# Patient Record
Sex: Female | Born: 1935 | Race: White | Hispanic: No | Smoking: Never smoker
Health system: Southern US, Community
[De-identification: ages and names within clinical notes are randomized; demographics above are authoritative.]

## PROBLEM LIST (undated history)

## (undated) DIAGNOSIS — I10 Essential (primary) hypertension: Secondary | ICD-10-CM

## (undated) DIAGNOSIS — M545 Low back pain, unspecified: Secondary | ICD-10-CM

## (undated) DIAGNOSIS — K219 Gastro-esophageal reflux disease without esophagitis: Secondary | ICD-10-CM

## (undated) DIAGNOSIS — N289 Disorder of kidney and ureter, unspecified: Secondary | ICD-10-CM

## (undated) DIAGNOSIS — R7309 Other abnormal glucose: Secondary | ICD-10-CM

## (undated) DIAGNOSIS — D649 Anemia, unspecified: Secondary | ICD-10-CM

## (undated) DIAGNOSIS — E785 Hyperlipidemia, unspecified: Secondary | ICD-10-CM

## (undated) DIAGNOSIS — M8580 Other specified disorders of bone density and structure, unspecified site: Secondary | ICD-10-CM

## (undated) DIAGNOSIS — F32A Depression, unspecified: Secondary | ICD-10-CM

## (undated) DIAGNOSIS — K449 Diaphragmatic hernia without obstruction or gangrene: Secondary | ICD-10-CM

## (undated) DIAGNOSIS — F329 Major depressive disorder, single episode, unspecified: Secondary | ICD-10-CM

## (undated) DIAGNOSIS — I739 Peripheral vascular disease, unspecified: Secondary | ICD-10-CM

## (undated) HISTORY — DX: Low back pain: M54.5

## (undated) HISTORY — PX: PARATHYROIDECTOMY: SHX19

## (undated) HISTORY — DX: Peripheral vascular disease, unspecified: I73.9

## (undated) HISTORY — PX: OTHER SURGICAL HISTORY: SHX169

## (undated) HISTORY — PX: CHOLECYSTECTOMY: SHX55

## (undated) HISTORY — DX: Disorder of kidney and ureter, unspecified: N28.9

## (undated) HISTORY — DX: Major depressive disorder, single episode, unspecified: F32.9

## (undated) HISTORY — DX: Other abnormal glucose: R73.09

## (undated) HISTORY — DX: Other specified disorders of bone density and structure, unspecified site: M85.80

## (undated) HISTORY — DX: Depression, unspecified: F32.A

## (undated) HISTORY — PX: TONSILLECTOMY AND ADENOIDECTOMY: SUR1326

## (undated) HISTORY — DX: Hyperlipidemia, unspecified: E78.5

## (undated) HISTORY — DX: Anemia, unspecified: D64.9

## (undated) HISTORY — DX: Gastro-esophageal reflux disease without esophagitis: K21.9

## (undated) HISTORY — PX: ESOPHAGOGASTRODUODENOSCOPY: SHX1529

## (undated) HISTORY — DX: Low back pain, unspecified: M54.50

## (undated) HISTORY — DX: Essential (primary) hypertension: I10

## (undated) HISTORY — DX: Diaphragmatic hernia without obstruction or gangrene: K44.9

---

## 1960-08-05 HISTORY — PX: APPENDECTOMY: SHX54

## 1960-08-05 HISTORY — PX: OOPHORECTOMY: SHX86

## 1960-08-05 HISTORY — PX: DILATION AND CURETTAGE OF UTERUS: SHX78

## 1997-11-01 ENCOUNTER — Other Ambulatory Visit: Admission: RE | Admit: 1997-11-01 | Discharge: 1997-11-01 | Payer: Self-pay | Admitting: Family Medicine

## 1998-09-21 ENCOUNTER — Other Ambulatory Visit: Admission: RE | Admit: 1998-09-21 | Discharge: 1998-09-21 | Payer: Self-pay | Admitting: Obstetrics and Gynecology

## 1998-10-13 ENCOUNTER — Ambulatory Visit (HOSPITAL_COMMUNITY): Admission: RE | Admit: 1998-10-13 | Discharge: 1998-10-13 | Payer: Self-pay | Admitting: Obstetrics and Gynecology

## 1998-10-13 ENCOUNTER — Encounter: Payer: Self-pay | Admitting: Obstetrics and Gynecology

## 1998-11-14 ENCOUNTER — Encounter: Payer: Self-pay | Admitting: Obstetrics and Gynecology

## 1998-11-17 ENCOUNTER — Ambulatory Visit (HOSPITAL_COMMUNITY): Admission: RE | Admit: 1998-11-17 | Discharge: 1998-11-17 | Payer: Self-pay | Admitting: Obstetrics and Gynecology

## 1999-04-12 ENCOUNTER — Encounter: Payer: Self-pay | Admitting: Emergency Medicine

## 1999-04-12 ENCOUNTER — Inpatient Hospital Stay (HOSPITAL_COMMUNITY): Admission: EM | Admit: 1999-04-12 | Discharge: 1999-04-13 | Payer: Self-pay | Admitting: Emergency Medicine

## 1999-08-09 ENCOUNTER — Encounter: Payer: Self-pay | Admitting: Family Medicine

## 1999-08-09 ENCOUNTER — Encounter: Admission: RE | Admit: 1999-08-09 | Discharge: 1999-08-09 | Payer: Self-pay | Admitting: Family Medicine

## 1999-09-26 ENCOUNTER — Other Ambulatory Visit: Admission: RE | Admit: 1999-09-26 | Discharge: 1999-09-26 | Payer: Self-pay | Admitting: *Deleted

## 2000-01-13 ENCOUNTER — Emergency Department (HOSPITAL_COMMUNITY): Admission: EM | Admit: 2000-01-13 | Discharge: 2000-01-13 | Payer: Self-pay | Admitting: *Deleted

## 2000-01-13 ENCOUNTER — Encounter: Payer: Self-pay | Admitting: Orthopedic Surgery

## 2000-01-13 ENCOUNTER — Encounter: Payer: Self-pay | Admitting: Emergency Medicine

## 2000-05-05 HISTORY — PX: WRIST FRACTURE SURGERY: SHX121

## 2000-05-14 ENCOUNTER — Encounter: Payer: Self-pay | Admitting: Gastroenterology

## 2000-08-12 ENCOUNTER — Encounter: Payer: Self-pay | Admitting: Internal Medicine

## 2000-08-12 ENCOUNTER — Encounter: Admission: RE | Admit: 2000-08-12 | Discharge: 2000-08-12 | Payer: Self-pay | Admitting: Internal Medicine

## 2000-09-30 ENCOUNTER — Other Ambulatory Visit: Admission: RE | Admit: 2000-09-30 | Discharge: 2000-09-30 | Payer: Self-pay | Admitting: Obstetrics and Gynecology

## 2000-12-10 ENCOUNTER — Encounter: Payer: Self-pay | Admitting: Gastroenterology

## 2000-12-25 ENCOUNTER — Ambulatory Visit (HOSPITAL_COMMUNITY): Admission: RE | Admit: 2000-12-25 | Discharge: 2000-12-25 | Payer: Self-pay | Admitting: *Deleted

## 2000-12-25 ENCOUNTER — Encounter: Payer: Self-pay | Admitting: *Deleted

## 2001-05-19 ENCOUNTER — Encounter: Admission: RE | Admit: 2001-05-19 | Discharge: 2001-06-18 | Payer: Self-pay | Admitting: Orthopaedic Surgery

## 2001-08-14 ENCOUNTER — Encounter: Admission: RE | Admit: 2001-08-14 | Discharge: 2001-08-14 | Payer: Self-pay | Admitting: Internal Medicine

## 2001-08-14 ENCOUNTER — Encounter: Payer: Self-pay | Admitting: Internal Medicine

## 2001-10-12 ENCOUNTER — Other Ambulatory Visit: Admission: RE | Admit: 2001-10-12 | Discharge: 2001-10-12 | Payer: Self-pay | Admitting: Obstetrics and Gynecology

## 2002-04-28 ENCOUNTER — Encounter: Admission: RE | Admit: 2002-04-28 | Discharge: 2002-05-21 | Payer: Self-pay | Admitting: Internal Medicine

## 2002-08-24 ENCOUNTER — Encounter: Payer: Self-pay | Admitting: Internal Medicine

## 2002-08-24 ENCOUNTER — Encounter: Admission: RE | Admit: 2002-08-24 | Discharge: 2002-08-24 | Payer: Self-pay | Admitting: Internal Medicine

## 2003-08-18 ENCOUNTER — Encounter: Admission: RE | Admit: 2003-08-18 | Discharge: 2003-08-18 | Payer: Self-pay | Admitting: Internal Medicine

## 2003-09-06 ENCOUNTER — Encounter: Admission: RE | Admit: 2003-09-06 | Discharge: 2003-09-06 | Payer: Self-pay | Admitting: Internal Medicine

## 2003-11-28 ENCOUNTER — Encounter: Admission: RE | Admit: 2003-11-28 | Discharge: 2003-11-28 | Payer: Self-pay | Admitting: Internal Medicine

## 2003-11-29 ENCOUNTER — Other Ambulatory Visit: Admission: RE | Admit: 2003-11-29 | Discharge: 2003-11-29 | Payer: Self-pay | Admitting: Obstetrics and Gynecology

## 2003-12-14 ENCOUNTER — Encounter: Admission: RE | Admit: 2003-12-14 | Discharge: 2003-12-28 | Payer: Self-pay | Admitting: Internal Medicine

## 2004-06-27 ENCOUNTER — Ambulatory Visit: Payer: Self-pay | Admitting: Internal Medicine

## 2004-07-03 ENCOUNTER — Ambulatory Visit (HOSPITAL_COMMUNITY): Admission: RE | Admit: 2004-07-03 | Discharge: 2004-07-03 | Payer: Self-pay | Admitting: Internal Medicine

## 2004-07-03 ENCOUNTER — Encounter: Payer: Self-pay | Admitting: Internal Medicine

## 2004-08-22 ENCOUNTER — Ambulatory Visit: Payer: Self-pay | Admitting: Internal Medicine

## 2004-09-06 ENCOUNTER — Encounter: Admission: RE | Admit: 2004-09-06 | Discharge: 2004-09-06 | Payer: Self-pay | Admitting: Internal Medicine

## 2004-09-18 ENCOUNTER — Ambulatory Visit: Payer: Self-pay | Admitting: Internal Medicine

## 2004-12-11 ENCOUNTER — Ambulatory Visit: Payer: Self-pay | Admitting: Internal Medicine

## 2004-12-18 ENCOUNTER — Ambulatory Visit: Payer: Self-pay | Admitting: Internal Medicine

## 2005-01-07 ENCOUNTER — Encounter: Payer: Self-pay | Admitting: Internal Medicine

## 2005-01-07 ENCOUNTER — Ambulatory Visit (HOSPITAL_BASED_OUTPATIENT_CLINIC_OR_DEPARTMENT_OTHER): Admission: RE | Admit: 2005-01-07 | Discharge: 2005-01-07 | Payer: Self-pay | Admitting: Internal Medicine

## 2005-01-17 ENCOUNTER — Ambulatory Visit: Payer: Self-pay | Admitting: Internal Medicine

## 2005-01-18 ENCOUNTER — Ambulatory Visit: Payer: Self-pay | Admitting: Pulmonary Disease

## 2005-02-25 ENCOUNTER — Ambulatory Visit: Payer: Self-pay | Admitting: Internal Medicine

## 2005-05-28 ENCOUNTER — Ambulatory Visit: Payer: Self-pay | Admitting: Internal Medicine

## 2005-06-03 ENCOUNTER — Ambulatory Visit: Payer: Self-pay | Admitting: Gastroenterology

## 2005-06-04 ENCOUNTER — Ambulatory Visit: Payer: Self-pay | Admitting: Internal Medicine

## 2005-06-12 ENCOUNTER — Ambulatory Visit: Payer: Self-pay | Admitting: Gastroenterology

## 2005-06-12 ENCOUNTER — Encounter (INDEPENDENT_AMBULATORY_CARE_PROVIDER_SITE_OTHER): Payer: Self-pay | Admitting: *Deleted

## 2005-06-12 ENCOUNTER — Encounter: Payer: Self-pay | Admitting: Internal Medicine

## 2005-06-20 ENCOUNTER — Ambulatory Visit: Payer: Self-pay | Admitting: Gastroenterology

## 2005-06-26 ENCOUNTER — Ambulatory Visit (HOSPITAL_COMMUNITY): Admission: RE | Admit: 2005-06-26 | Discharge: 2005-06-26 | Payer: Self-pay | Admitting: Gastroenterology

## 2005-07-15 ENCOUNTER — Ambulatory Visit: Payer: Self-pay | Admitting: Internal Medicine

## 2005-08-05 DIAGNOSIS — N289 Disorder of kidney and ureter, unspecified: Secondary | ICD-10-CM

## 2005-08-05 HISTORY — DX: Disorder of kidney and ureter, unspecified: N28.9

## 2005-08-20 ENCOUNTER — Ambulatory Visit: Payer: Self-pay | Admitting: Internal Medicine

## 2005-09-02 ENCOUNTER — Ambulatory Visit: Payer: Self-pay | Admitting: Gastroenterology

## 2005-09-02 ENCOUNTER — Encounter (INDEPENDENT_AMBULATORY_CARE_PROVIDER_SITE_OTHER): Payer: Self-pay | Admitting: *Deleted

## 2005-09-02 ENCOUNTER — Encounter: Payer: Self-pay | Admitting: Internal Medicine

## 2005-09-25 ENCOUNTER — Ambulatory Visit: Payer: Self-pay | Admitting: Internal Medicine

## 2005-10-08 ENCOUNTER — Encounter: Admission: RE | Admit: 2005-10-08 | Discharge: 2005-10-08 | Payer: Self-pay | Admitting: Internal Medicine

## 2005-10-24 ENCOUNTER — Ambulatory Visit: Payer: Self-pay | Admitting: Internal Medicine

## 2005-11-19 ENCOUNTER — Ambulatory Visit: Payer: Self-pay | Admitting: Internal Medicine

## 2005-11-26 ENCOUNTER — Ambulatory Visit: Payer: Self-pay | Admitting: Internal Medicine

## 2006-03-05 ENCOUNTER — Ambulatory Visit: Payer: Self-pay | Admitting: Internal Medicine

## 2006-06-10 ENCOUNTER — Ambulatory Visit: Payer: Self-pay | Admitting: Internal Medicine

## 2006-06-10 LAB — CONVERTED CEMR LAB
ALT: 17 units/L (ref 0–40)
AST: 21 units/L (ref 0–37)
Albumin: 3.9 g/dL (ref 3.5–5.2)
Alkaline Phosphatase: 75 units/L (ref 39–117)
BUN: 27 mg/dL — ABNORMAL HIGH (ref 6–23)
Basophils Absolute: 0 10*3/uL (ref 0.0–0.1)
Basophils Relative: 0.5 % (ref 0.0–1.0)
Bilirubin, Direct: 0.1 mg/dL (ref 0.0–0.3)
CO2: 29 meq/L (ref 19–32)
Calcium: 9.5 mg/dL (ref 8.4–10.5)
Chloride: 105 meq/L (ref 96–112)
Chol/HDL Ratio, serum: 3.2
Cholesterol: 151 mg/dL (ref 0–200)
Creatinine, Ser: 1.3 mg/dL — ABNORMAL HIGH (ref 0.4–1.2)
Eosinophil percent: 4.3 % (ref 0.0–5.0)
GFR calc non Af Amer: 43 mL/min
Glomerular Filtration Rate, Af Am: 51 mL/min/{1.73_m2}
Glucose, Bld: 105 mg/dL — ABNORMAL HIGH (ref 70–99)
HCT: 40 % (ref 36.0–46.0)
HDL: 46.7 mg/dL (ref >39.0)
Hemoglobin: 13.5 g/dL (ref 12.0–15.0)
LDL Cholesterol: 83 mg/dL (ref 0–99)
Lymphocytes Relative: 30.4 % (ref 12.0–46.0)
MCHC: 33.7 g/dL (ref 30.0–36.0)
MCV: 90.7 fL (ref 78.0–100.0)
Monocytes Absolute: 0.4 10*3/uL (ref 0.2–0.7)
Monocytes Relative: 5.5 % (ref 3.0–11.0)
Neutro Abs: 4.7 10*3/uL (ref 1.4–7.7)
Neutrophils Relative %: 59.3 % (ref 43.0–77.0)
Platelets: 246 10*3/uL (ref 150–400)
Potassium: 4.2 meq/L (ref 3.5–5.1)
RBC: 4.41 M/uL (ref 3.87–5.11)
RDW: 12.5 % (ref 11.5–14.6)
Sodium: 142 meq/L (ref 135–145)
TSH: 2.28 microintl units/mL (ref 0.35–5.50)
Total Bilirubin: 0.6 mg/dL (ref 0.3–1.2)
Total Protein: 7.5 g/dL (ref 6.0–8.3)
Triglyceride fasting, serum: 105 mg/dL (ref 0–149)
VLDL: 21 mg/dL (ref 0–40)
WBC: 7.8 10*3/uL (ref 4.5–10.5)

## 2006-10-13 ENCOUNTER — Encounter: Admission: RE | Admit: 2006-10-13 | Discharge: 2006-10-13 | Payer: Self-pay | Admitting: Internal Medicine

## 2006-10-16 ENCOUNTER — Ambulatory Visit: Payer: Self-pay | Admitting: Internal Medicine

## 2006-10-16 LAB — CONVERTED CEMR LAB
ALT: 18 units/L (ref 0–40)
AST: 23 units/L (ref 0–37)
Albumin: 3.8 g/dL (ref 3.5–5.2)
Alkaline Phosphatase: 84 units/L (ref 39–117)
BUN: 22 mg/dL (ref 6–23)
Bilirubin, Direct: 0.1 mg/dL (ref 0.0–0.3)
CO2: 29 meq/L (ref 19–32)
Calcium: 9.6 mg/dL (ref 8.4–10.5)
Chloride: 105 meq/L (ref 96–112)
Cholesterol: 158 mg/dL (ref 0–200)
Creatinine, Ser: 1.2 mg/dL (ref 0.4–1.2)
GFR calc Af Amer: 56 mL/min
GFR calc non Af Amer: 47 mL/min
Glucose, Bld: 108 mg/dL — ABNORMAL HIGH (ref 70–99)
HDL: 44.7 mg/dL (ref >39.0)
Hgb A1c MFr Bld: 6.1 % — ABNORMAL HIGH (ref 4.6–6.0)
LDL Cholesterol: 82 mg/dL (ref 0–99)
Potassium: 4.6 meq/L (ref 3.5–5.1)
Sodium: 142 meq/L (ref 135–145)
Total Bilirubin: 0.8 mg/dL (ref 0.3–1.2)
Total CHOL/HDL Ratio: 3.5
Total Protein: 7.4 g/dL (ref 6.0–8.3)
Triglycerides: 158 mg/dL — ABNORMAL HIGH (ref 0–149)
VLDL: 32 mg/dL (ref 0–40)

## 2007-02-13 ENCOUNTER — Ambulatory Visit: Payer: Self-pay | Admitting: Internal Medicine

## 2007-02-13 LAB — CONVERTED CEMR LAB
ALT: 18 units/L (ref 0–35)
AST: 24 units/L (ref 0–37)
Albumin: 4 g/dL (ref 3.5–5.2)
Alkaline Phosphatase: 89 units/L (ref 39–117)
BUN: 26 mg/dL — ABNORMAL HIGH (ref 6–23)
Bilirubin, Direct: 0.1 mg/dL (ref 0.0–0.3)
CO2: 29 meq/L (ref 19–32)
Calcium: 9.8 mg/dL (ref 8.4–10.5)
Chloride: 101 meq/L (ref 96–112)
Cholesterol: 220 mg/dL (ref 0–200)
Creatinine, Ser: 1.3 mg/dL — ABNORMAL HIGH (ref 0.4–1.2)
Direct LDL: 112.5 mg/dL
GFR calc Af Amer: 51 mL/min
GFR calc non Af Amer: 42 mL/min
Glucose, Bld: 108 mg/dL — ABNORMAL HIGH (ref 70–99)
HDL: 42.5 mg/dL (ref >39.0)
Potassium: 4.7 meq/L (ref 3.5–5.1)
Sodium: 142 meq/L (ref 135–145)
Total Bilirubin: 0.9 mg/dL (ref 0.3–1.2)
Total CHOL/HDL Ratio: 5.2
Total Protein: 7.1 g/dL (ref 6.0–8.3)
Triglycerides: 216 mg/dL (ref 0–149)
VLDL: 43 mg/dL — ABNORMAL HIGH (ref 0–40)

## 2007-02-26 DIAGNOSIS — R7309 Other abnormal glucose: Secondary | ICD-10-CM

## 2007-02-26 DIAGNOSIS — F329 Major depressive disorder, single episode, unspecified: Secondary | ICD-10-CM

## 2007-02-26 DIAGNOSIS — E785 Hyperlipidemia, unspecified: Secondary | ICD-10-CM

## 2007-02-26 DIAGNOSIS — I10 Essential (primary) hypertension: Secondary | ICD-10-CM | POA: Insufficient documentation

## 2007-02-26 DIAGNOSIS — K219 Gastro-esophageal reflux disease without esophagitis: Secondary | ICD-10-CM | POA: Insufficient documentation

## 2007-02-26 DIAGNOSIS — M899 Disorder of bone, unspecified: Secondary | ICD-10-CM | POA: Insufficient documentation

## 2007-02-26 DIAGNOSIS — N183 Chronic kidney disease, stage 3 (moderate): Secondary | ICD-10-CM

## 2007-02-26 DIAGNOSIS — M949 Disorder of cartilage, unspecified: Secondary | ICD-10-CM

## 2007-02-26 DIAGNOSIS — R7303 Prediabetes: Secondary | ICD-10-CM

## 2007-02-26 HISTORY — DX: Other abnormal glucose: R73.09

## 2007-03-27 ENCOUNTER — Ambulatory Visit: Payer: Self-pay | Admitting: Internal Medicine

## 2007-03-27 LAB — CONVERTED CEMR LAB
BUN: 27 mg/dL — ABNORMAL HIGH
CO2: 26 meq/L
Calcium: 9.6 mg/dL
Chloride: 104 meq/L
Cholesterol: 143 mg/dL
Creatinine, Ser: 1.3 mg/dL — ABNORMAL HIGH
GFR calc Af Amer: 51 mL/min
GFR calc non Af Amer: 42 mL/min
Glucose, Bld: 114 mg/dL — ABNORMAL HIGH
HDL: 37.4 mg/dL — ABNORMAL LOW
Hgb A1c MFr Bld: 6 %
LDL Cholesterol: 74 mg/dL
Potassium: 5.1 meq/L
Sodium: 141 meq/L
Total CHOL/HDL Ratio: 3.8
Triglycerides: 159 mg/dL — ABNORMAL HIGH
VLDL: 32 mg/dL

## 2007-04-27 ENCOUNTER — Ambulatory Visit: Payer: Self-pay | Admitting: Internal Medicine

## 2007-04-27 LAB — CONVERTED CEMR LAB
Bilirubin Urine: NEGATIVE
Blood in Urine, dipstick: NEGATIVE
Glucose, Urine, Semiquant: NEGATIVE
Ketones, urine, test strip: NEGATIVE
Nitrite: NEGATIVE
Protein, U semiquant: NEGATIVE
Specific Gravity, Urine: 1.02
Urobilinogen, UA: NEGATIVE
WBC Urine, dipstick: NEGATIVE
pH: 7

## 2007-06-05 ENCOUNTER — Ambulatory Visit: Payer: Self-pay | Admitting: Internal Medicine

## 2007-07-03 ENCOUNTER — Ambulatory Visit: Payer: Self-pay | Admitting: Internal Medicine

## 2007-07-03 ENCOUNTER — Encounter: Admission: RE | Admit: 2007-07-03 | Discharge: 2007-07-03 | Payer: Self-pay | Admitting: Internal Medicine

## 2007-07-06 LAB — CONVERTED CEMR LAB
Albumin: 3.6 g/dL (ref 3.5–5.2)
BUN: 30 mg/dL — ABNORMAL HIGH (ref 6–23)
Basophils Absolute: 0 10*3/uL (ref 0.0–0.1)
Basophils Relative: 0.2 % (ref 0.0–1.0)
CO2: 27 meq/L (ref 19–32)
Calcium: 9.1 mg/dL (ref 8.4–10.5)
Chloride: 105 meq/L (ref 96–112)
Creatinine, Ser: 1.3 mg/dL — ABNORMAL HIGH (ref 0.4–1.2)
Eosinophils Absolute: 0.2 10*3/uL (ref 0.0–0.6)
Eosinophils Relative: 2.3 % (ref 0.0–5.0)
GFR calc Af Amer: 51 mL/min
GFR calc non Af Amer: 42 mL/min
Glucose, Bld: 100 mg/dL — ABNORMAL HIGH (ref 70–99)
HCT: 36.3 % (ref 36.0–46.0)
Hemoglobin: 12.7 g/dL (ref 12.0–15.0)
Lymphocytes Relative: 31.7 % (ref 12.0–46.0)
MCHC: 35.1 g/dL (ref 30.0–36.0)
MCV: 89.3 fL (ref 78.0–100.0)
Monocytes Absolute: 0.5 10*3/uL (ref 0.2–0.7)
Monocytes Relative: 6.5 % (ref 3.0–11.0)
Neutro Abs: 4.2 10*3/uL (ref 1.4–7.7)
Neutrophils Relative %: 59.3 % (ref 43.0–77.0)
Phosphorus: 2.4 mg/dL (ref 2.3–4.6)
Platelets: 174 10*3/uL (ref 150–400)
Potassium: 5 meq/L (ref 3.5–5.1)
RBC: 4.06 M/uL (ref 3.87–5.11)
RDW: 12.1 % (ref 11.5–14.6)
Sodium: 138 meq/L (ref 135–145)
WBC: 7.2 10*3/uL (ref 4.5–10.5)

## 2007-08-31 ENCOUNTER — Ambulatory Visit: Payer: Self-pay | Admitting: Internal Medicine

## 2007-08-31 ENCOUNTER — Encounter: Payer: Self-pay | Admitting: Internal Medicine

## 2007-09-16 ENCOUNTER — Ambulatory Visit: Payer: Self-pay | Admitting: Internal Medicine

## 2007-09-29 ENCOUNTER — Ambulatory Visit: Payer: Self-pay | Admitting: Internal Medicine

## 2007-10-15 ENCOUNTER — Encounter: Admission: RE | Admit: 2007-10-15 | Discharge: 2007-10-15 | Payer: Self-pay | Admitting: Internal Medicine

## 2007-12-08 ENCOUNTER — Ambulatory Visit: Payer: Self-pay | Admitting: Internal Medicine

## 2007-12-14 ENCOUNTER — Ambulatory Visit: Payer: Self-pay | Admitting: Internal Medicine

## 2007-12-14 LAB — CONVERTED CEMR LAB: Crystals, Fluid: NONE SEEN

## 2007-12-15 ENCOUNTER — Encounter: Payer: Self-pay | Admitting: Internal Medicine

## 2007-12-22 ENCOUNTER — Telehealth: Payer: Self-pay | Admitting: Internal Medicine

## 2007-12-23 ENCOUNTER — Ambulatory Visit: Payer: Self-pay | Admitting: Internal Medicine

## 2008-01-14 ENCOUNTER — Ambulatory Visit: Payer: Self-pay | Admitting: Internal Medicine

## 2008-01-15 LAB — CONVERTED CEMR LAB
ALT: 16 units/L (ref 0–35)
AST: 18 units/L (ref 0–37)
BUN: 31 mg/dL — ABNORMAL HIGH (ref 6–23)
CO2: 26 meq/L (ref 19–32)
Calcium: 9.4 mg/dL (ref 8.4–10.5)
Chloride: 106 meq/L (ref 96–112)
Cholesterol: 139 mg/dL (ref 0–200)
Creatinine, Ser: 1.2 mg/dL (ref 0.4–1.2)
GFR calc Af Amer: 56 mL/min
GFR calc non Af Amer: 46 mL/min
Glucose, Bld: 96 mg/dL (ref 70–99)
HDL: 34.6 mg/dL — ABNORMAL LOW (ref >39.0)
Hgb A1c MFr Bld: 6 % (ref 4.6–6.0)
LDL Cholesterol: 67 mg/dL (ref 0–99)
Potassium: 4.9 meq/L (ref 3.5–5.1)
Sodium: 140 meq/L (ref 135–145)
Total CHOL/HDL Ratio: 4
Triglycerides: 189 mg/dL — ABNORMAL HIGH (ref 0–149)
VLDL: 38 mg/dL (ref 0–40)

## 2008-01-20 ENCOUNTER — Encounter: Admission: RE | Admit: 2008-01-20 | Discharge: 2008-02-24 | Payer: Self-pay | Admitting: Internal Medicine

## 2008-02-01 ENCOUNTER — Telehealth: Payer: Self-pay | Admitting: Internal Medicine

## 2008-02-08 ENCOUNTER — Encounter: Payer: Self-pay | Admitting: Internal Medicine

## 2008-04-14 ENCOUNTER — Ambulatory Visit: Payer: Self-pay | Admitting: Internal Medicine

## 2008-04-14 DIAGNOSIS — E669 Obesity, unspecified: Secondary | ICD-10-CM | POA: Insufficient documentation

## 2008-05-06 ENCOUNTER — Encounter: Payer: Self-pay | Admitting: Internal Medicine

## 2008-06-17 ENCOUNTER — Ambulatory Visit: Payer: Self-pay | Admitting: Family Medicine

## 2008-06-17 LAB — CONVERTED CEMR LAB
Glucose, Urine, Semiquant: NEGATIVE
Ketones, urine, test strip: NEGATIVE
Nitrite: NEGATIVE
Protein, U semiquant: NEGATIVE
Specific Gravity, Urine: 1.01
Urobilinogen, UA: 0.2
pH: 7.5

## 2008-08-24 ENCOUNTER — Ambulatory Visit: Payer: Self-pay | Admitting: Internal Medicine

## 2008-08-26 LAB — CONVERTED CEMR LAB
Basophils Absolute: 0 10*3/uL (ref 0.0–0.1)
Basophils Relative: 0 % (ref 0.0–3.0)
Calcium: 9.3 mg/dL (ref 8.4–10.5)
Cholesterol: 136 mg/dL (ref 0–200)
Creatinine, Ser: 1.2 mg/dL (ref 0.4–1.2)
Eosinophils Absolute: 0.2 10*3/uL (ref 0.0–0.7)
GFR calc non Af Amer: 46 mL/min
Hgb A1c MFr Bld: 6.1 % — ABNORMAL HIGH (ref 4.6–6.0)
MCHC: 34 g/dL (ref 30.0–36.0)
MCV: 92 fL (ref 78.0–100.0)
Neutro Abs: 3.7 10*3/uL (ref 1.4–7.7)
Neutrophils Relative %: 53.1 % (ref 43.0–77.0)
RBC: 4.01 M/uL (ref 3.87–5.11)
TSH: 2.5 microintl units/mL (ref 0.35–5.50)
Total Bilirubin: 0.7 mg/dL (ref 0.3–1.2)
VLDL: 31 mg/dL (ref 0–40)

## 2008-09-07 ENCOUNTER — Other Ambulatory Visit: Admission: RE | Admit: 2008-09-07 | Discharge: 2008-09-07 | Payer: Self-pay | Admitting: Obstetrics and Gynecology

## 2008-09-20 ENCOUNTER — Encounter: Payer: Self-pay | Admitting: Internal Medicine

## 2008-09-26 ENCOUNTER — Ambulatory Visit: Payer: Self-pay | Admitting: Family Medicine

## 2008-10-18 ENCOUNTER — Encounter: Admission: RE | Admit: 2008-10-18 | Discharge: 2008-10-18 | Payer: Self-pay | Admitting: Internal Medicine

## 2008-12-28 ENCOUNTER — Ambulatory Visit: Payer: Self-pay | Admitting: Internal Medicine

## 2008-12-28 DIAGNOSIS — M1710 Unilateral primary osteoarthritis, unspecified knee: Secondary | ICD-10-CM

## 2008-12-28 DIAGNOSIS — IMO0002 Reserved for concepts with insufficient information to code with codable children: Secondary | ICD-10-CM | POA: Insufficient documentation

## 2009-01-05 ENCOUNTER — Telehealth (INDEPENDENT_AMBULATORY_CARE_PROVIDER_SITE_OTHER): Payer: Self-pay | Admitting: *Deleted

## 2009-01-12 ENCOUNTER — Encounter: Payer: Self-pay | Admitting: Internal Medicine

## 2009-02-07 ENCOUNTER — Telehealth: Payer: Self-pay | Admitting: Internal Medicine

## 2009-02-07 ENCOUNTER — Ambulatory Visit: Payer: Self-pay | Admitting: Internal Medicine

## 2009-02-14 ENCOUNTER — Telehealth: Payer: Self-pay | Admitting: Internal Medicine

## 2009-03-08 ENCOUNTER — Ambulatory Visit: Payer: Self-pay | Admitting: Family Medicine

## 2009-04-12 ENCOUNTER — Ambulatory Visit: Payer: Self-pay | Admitting: Internal Medicine

## 2009-04-12 LAB — CONVERTED CEMR LAB
Bilirubin, Direct: 0 mg/dL (ref 0.0–0.3)
Cholesterol: 151 mg/dL (ref 0–200)
Eosinophils Relative: 3.9 % (ref 0.0–5.0)
GFR calc non Af Amer: 38.68 mL/min (ref >60)
HCT: 38 % (ref 36.0–46.0)
HDL: 49.1 mg/dL (ref >39.00)
Hemoglobin: 12.5 g/dL (ref 12.0–15.0)
LDL Cholesterol: 80 mg/dL (ref 0–99)
Lymphs Abs: 2.2 10*3/uL (ref 0.7–4.0)
Monocytes Relative: 5.4 % (ref 3.0–12.0)
Neutro Abs: 4.7 10*3/uL (ref 1.4–7.7)
Platelets: 180 10*3/uL (ref 150.0–400.0)
Potassium: 4.6 meq/L (ref 3.5–5.1)
Sodium: 142 meq/L (ref 135–145)
TSH: 1.94 microintl units/mL (ref 0.35–5.50)
Total Protein: 7.3 g/dL (ref 6.0–8.3)
VLDL: 22.2 mg/dL (ref 0.0–40.0)
WBC: 7.6 10*3/uL (ref 4.5–10.5)

## 2009-05-15 ENCOUNTER — Ambulatory Visit: Payer: Self-pay | Admitting: Internal Medicine

## 2009-05-29 ENCOUNTER — Ambulatory Visit: Payer: Self-pay | Admitting: Family Medicine

## 2009-05-29 LAB — CONVERTED CEMR LAB
Glucose, Urine, Semiquant: NEGATIVE
Ketones, urine, test strip: NEGATIVE
Nitrite: NEGATIVE
Protein, U semiquant: NEGATIVE
Urobilinogen, UA: 0.2

## 2009-05-30 ENCOUNTER — Encounter: Payer: Self-pay | Admitting: Internal Medicine

## 2009-05-31 ENCOUNTER — Encounter: Admission: RE | Admit: 2009-05-31 | Discharge: 2009-05-31 | Payer: Self-pay | Admitting: Internal Medicine

## 2009-06-02 ENCOUNTER — Telehealth: Payer: Self-pay | Admitting: Gastroenterology

## 2009-06-02 ENCOUNTER — Encounter (INDEPENDENT_AMBULATORY_CARE_PROVIDER_SITE_OTHER): Payer: Self-pay | Admitting: *Deleted

## 2009-06-06 ENCOUNTER — Telehealth: Payer: Self-pay | Admitting: Physician Assistant

## 2009-06-06 ENCOUNTER — Ambulatory Visit: Payer: Self-pay | Admitting: Internal Medicine

## 2009-06-06 ENCOUNTER — Encounter: Payer: Self-pay | Admitting: Physician Assistant

## 2009-06-06 LAB — CONVERTED CEMR LAB
ALT: 18 units/L (ref 0–35)
Albumin: 4.3 g/dL (ref 3.5–5.2)
CO2: 23 meq/L (ref 19–32)
Glucose, Bld: 113 mg/dL — ABNORMAL HIGH (ref 70–99)
Potassium: 5.3 meq/L (ref 3.5–5.3)
Sodium: 139 meq/L (ref 135–145)
Total Protein: 7.2 g/dL (ref 6.0–8.3)

## 2009-06-14 LAB — CONVERTED CEMR LAB
Basophils Absolute: 0.1 10*3/uL (ref 0.0–0.1)
Eosinophils Relative: 2.7 % (ref 0.0–5.0)
Lymphocytes Relative: 33.3 % (ref 12.0–46.0)
Monocytes Relative: 3.9 % (ref 3.0–12.0)
Neutrophils Relative %: 59.4 % (ref 43.0–77.0)
Platelets: 199 10*3/uL (ref 150.0–400.0)
RDW: 12.6 % (ref 11.5–14.6)
WBC: 7.6 10*3/uL (ref 4.5–10.5)

## 2009-07-03 ENCOUNTER — Encounter (INDEPENDENT_AMBULATORY_CARE_PROVIDER_SITE_OTHER): Payer: Self-pay | Admitting: *Deleted

## 2009-07-03 ENCOUNTER — Ambulatory Visit: Payer: Self-pay | Admitting: Gastroenterology

## 2009-07-03 DIAGNOSIS — D649 Anemia, unspecified: Secondary | ICD-10-CM

## 2009-07-03 HISTORY — DX: Anemia, unspecified: D64.9

## 2009-07-11 ENCOUNTER — Ambulatory Visit: Payer: Self-pay | Admitting: Gastroenterology

## 2009-07-14 ENCOUNTER — Encounter: Payer: Self-pay | Admitting: Gastroenterology

## 2009-08-08 ENCOUNTER — Telehealth: Payer: Self-pay | Admitting: Internal Medicine

## 2009-08-18 ENCOUNTER — Ambulatory Visit: Payer: Self-pay | Admitting: Internal Medicine

## 2009-10-09 ENCOUNTER — Telehealth: Payer: Self-pay | Admitting: Internal Medicine

## 2009-10-19 ENCOUNTER — Encounter: Admission: RE | Admit: 2009-10-19 | Discharge: 2009-10-19 | Payer: Self-pay | Admitting: Internal Medicine

## 2009-10-19 LAB — HM MAMMOGRAPHY

## 2009-10-23 ENCOUNTER — Telehealth: Payer: Self-pay | Admitting: Internal Medicine

## 2009-10-24 ENCOUNTER — Encounter: Payer: Self-pay | Admitting: Internal Medicine

## 2009-10-24 ENCOUNTER — Ambulatory Visit: Payer: Self-pay | Admitting: Internal Medicine

## 2009-10-26 ENCOUNTER — Telehealth: Payer: Self-pay | Admitting: Internal Medicine

## 2009-10-27 ENCOUNTER — Telehealth: Payer: Self-pay | Admitting: Internal Medicine

## 2009-11-07 ENCOUNTER — Ambulatory Visit: Payer: Self-pay | Admitting: Internal Medicine

## 2009-11-08 LAB — CONVERTED CEMR LAB
ALT: 22 units/L (ref 0–35)
Albumin: 4.1 g/dL (ref 3.5–5.2)
BUN: 26 mg/dL — ABNORMAL HIGH (ref 6–23)
Basophils Relative: 0.2 % (ref 0.0–3.0)
Chloride: 103 meq/L (ref 96–112)
Cholesterol: 133 mg/dL (ref 0–200)
Eosinophils Absolute: 0.3 10*3/uL (ref 0.0–0.7)
Eosinophils Relative: 4.4 % (ref 0.0–5.0)
GFR calc non Af Amer: 35.66 mL/min (ref >60)
HDL: 46.2 mg/dL (ref >39.00)
LDL Cholesterol: 56 mg/dL (ref 0–99)
Lymphocytes Relative: 31.8 % (ref 12.0–46.0)
MCHC: 34.4 g/dL (ref 30.0–36.0)
MCV: 91.4 fL (ref 78.0–100.0)
Monocytes Absolute: 0.4 10*3/uL (ref 0.1–1.0)
Neutrophils Relative %: 57.5 % (ref 43.0–77.0)
Platelets: 192 10*3/uL (ref 150.0–400.0)
Potassium: 4.5 meq/L (ref 3.5–5.1)
RBC: 3.84 M/uL — ABNORMAL LOW (ref 3.87–5.11)
Sodium: 140 meq/L (ref 135–145)
Total Protein: 7.1 g/dL (ref 6.0–8.3)
VLDL: 31.2 mg/dL (ref 0.0–40.0)
WBC: 7.2 10*3/uL (ref 4.5–10.5)

## 2009-11-09 ENCOUNTER — Ambulatory Visit: Payer: Self-pay | Admitting: Internal Medicine

## 2009-11-09 ENCOUNTER — Inpatient Hospital Stay (HOSPITAL_COMMUNITY): Admission: EM | Admit: 2009-11-09 | Discharge: 2009-11-10 | Payer: Self-pay | Admitting: Emergency Medicine

## 2009-11-10 ENCOUNTER — Encounter (INDEPENDENT_AMBULATORY_CARE_PROVIDER_SITE_OTHER): Payer: Self-pay | Admitting: Internal Medicine

## 2009-11-21 ENCOUNTER — Ambulatory Visit: Payer: Self-pay | Admitting: Internal Medicine

## 2010-01-04 ENCOUNTER — Ambulatory Visit: Payer: Self-pay | Admitting: Internal Medicine

## 2010-02-19 ENCOUNTER — Emergency Department (HOSPITAL_COMMUNITY): Admission: EM | Admit: 2010-02-19 | Discharge: 2010-02-19 | Payer: Self-pay | Admitting: Emergency Medicine

## 2010-02-23 ENCOUNTER — Ambulatory Visit: Payer: Self-pay | Admitting: Internal Medicine

## 2010-02-28 ENCOUNTER — Encounter: Admission: RE | Admit: 2010-02-28 | Discharge: 2010-04-06 | Payer: Self-pay | Admitting: Internal Medicine

## 2010-03-05 ENCOUNTER — Encounter: Payer: Self-pay | Admitting: Internal Medicine

## 2010-03-07 ENCOUNTER — Telehealth: Payer: Self-pay | Admitting: Internal Medicine

## 2010-03-13 ENCOUNTER — Ambulatory Visit: Payer: Self-pay | Admitting: Internal Medicine

## 2010-03-23 ENCOUNTER — Ambulatory Visit: Payer: Self-pay | Admitting: Family Medicine

## 2010-03-26 ENCOUNTER — Ambulatory Visit: Payer: Self-pay

## 2010-03-26 ENCOUNTER — Encounter: Payer: Self-pay | Admitting: Family Medicine

## 2010-03-26 ENCOUNTER — Encounter: Payer: Self-pay | Admitting: Internal Medicine

## 2010-03-26 ENCOUNTER — Telehealth (INDEPENDENT_AMBULATORY_CARE_PROVIDER_SITE_OTHER): Payer: Self-pay | Admitting: *Deleted

## 2010-04-05 ENCOUNTER — Encounter: Payer: Self-pay | Admitting: Internal Medicine

## 2010-04-19 ENCOUNTER — Encounter (INDEPENDENT_AMBULATORY_CARE_PROVIDER_SITE_OTHER): Payer: Self-pay | Admitting: *Deleted

## 2010-04-30 ENCOUNTER — Ambulatory Visit: Payer: Self-pay | Admitting: Internal Medicine

## 2010-05-08 LAB — CONVERTED CEMR LAB
ALT: 17 units/L (ref 0–35)
Albumin: 4.2 g/dL (ref 3.5–5.2)
Bilirubin, Direct: 0.1 mg/dL (ref 0.0–0.3)
Chloride: 104 meq/L (ref 96–112)
Cholesterol: 160 mg/dL (ref 0–200)
Eosinophils Absolute: 0.5 10*3/uL (ref 0.0–0.7)
Eosinophils Relative: 5.2 % — ABNORMAL HIGH (ref 0.0–5.0)
GFR calc non Af Amer: 31.04 mL/min (ref >60)
HDL: 50.8 mg/dL (ref >39.00)
LDL Cholesterol: 75 mg/dL (ref 0–99)
Lymphocytes Relative: 25.3 % (ref 12.0–46.0)
MCV: 94.3 fL (ref 78.0–100.0)
Monocytes Absolute: 0.7 10*3/uL (ref 0.1–1.0)
Neutrophils Relative %: 62.1 % (ref 43.0–77.0)
Platelets: 235 10*3/uL (ref 150.0–400.0)
Potassium: 5.4 meq/L — ABNORMAL HIGH (ref 3.5–5.1)
RBC: 3.56 M/uL — ABNORMAL LOW (ref 3.87–5.11)
Sodium: 140 meq/L (ref 135–145)
Total Protein: 6.7 g/dL (ref 6.0–8.3)
Triglycerides: 173 mg/dL — ABNORMAL HIGH (ref 0.0–149.0)
VLDL: 34.6 mg/dL (ref 0.0–40.0)
WBC: 9.2 10*3/uL (ref 4.5–10.5)

## 2010-05-22 ENCOUNTER — Ambulatory Visit: Payer: Self-pay | Admitting: Internal Medicine

## 2010-05-28 LAB — CONVERTED CEMR LAB
Chloride: 109 meq/L (ref 96–112)
GFR calc non Af Amer: 30.83 mL/min (ref >60)
Glucose, Bld: 105 mg/dL — ABNORMAL HIGH (ref 70–99)
Potassium: 5.4 meq/L — ABNORMAL HIGH (ref 3.5–5.1)
Sodium: 143 meq/L (ref 135–145)

## 2010-06-08 ENCOUNTER — Encounter: Payer: Self-pay | Admitting: Cardiovascular Disease

## 2010-06-08 ENCOUNTER — Ambulatory Visit: Payer: Self-pay

## 2010-06-08 ENCOUNTER — Telehealth: Payer: Self-pay | Admitting: Internal Medicine

## 2010-06-08 DIAGNOSIS — I739 Peripheral vascular disease, unspecified: Secondary | ICD-10-CM

## 2010-06-08 HISTORY — DX: Peripheral vascular disease, unspecified: I73.9

## 2010-06-18 ENCOUNTER — Ambulatory Visit: Payer: Self-pay | Admitting: Internal Medicine

## 2010-06-18 LAB — CONVERTED CEMR LAB
CO2: 27 meq/L (ref 19–32)
Chloride: 102 meq/L (ref 96–112)
Glucose, Bld: 110 mg/dL — ABNORMAL HIGH (ref 70–99)
Sodium: 137 meq/L (ref 135–145)

## 2010-07-12 ENCOUNTER — Telehealth: Payer: Self-pay | Admitting: Internal Medicine

## 2010-07-13 ENCOUNTER — Telehealth: Payer: Self-pay | Admitting: Internal Medicine

## 2010-08-09 ENCOUNTER — Other Ambulatory Visit: Payer: Self-pay | Admitting: Internal Medicine

## 2010-08-09 ENCOUNTER — Ambulatory Visit
Admission: RE | Admit: 2010-08-09 | Discharge: 2010-08-09 | Payer: Self-pay | Source: Home / Self Care | Attending: Internal Medicine | Admitting: Internal Medicine

## 2010-08-09 LAB — CBC WITH DIFFERENTIAL/PLATELET
Basophils Absolute: 0 10*3/uL (ref 0.0–0.1)
Basophils Relative: 0.3 % (ref 0.0–3.0)
Eosinophils Absolute: 0.3 10*3/uL (ref 0.0–0.7)
Eosinophils Relative: 3.9 % (ref 0.0–5.0)
HCT: 32.2 % — ABNORMAL LOW (ref 36.0–46.0)
Hemoglobin: 10.8 g/dL — ABNORMAL LOW (ref 12.0–15.0)
Lymphocytes Relative: 27.1 % (ref 12.0–46.0)
Lymphs Abs: 2 10*3/uL (ref 0.7–4.0)
MCHC: 33.6 g/dL (ref 30.0–36.0)
MCV: 93.9 fl (ref 78.0–100.0)
Monocytes Absolute: 0.4 10*3/uL (ref 0.1–1.0)
Monocytes Relative: 5.2 % (ref 3.0–12.0)
Neutro Abs: 4.6 10*3/uL (ref 1.4–7.7)
Neutrophils Relative %: 63.5 % (ref 43.0–77.0)
Platelets: 206 10*3/uL (ref 150.0–400.0)
RBC: 3.43 Mil/uL — ABNORMAL LOW (ref 3.87–5.11)
RDW: 12.8 % (ref 11.5–14.6)
WBC: 7.2 10*3/uL (ref 4.5–10.5)

## 2010-08-09 LAB — HEMOGLOBIN A1C: Hgb A1c MFr Bld: 5.8 % (ref 4.6–6.5)

## 2010-08-09 LAB — BASIC METABOLIC PANEL
BUN: 38 mg/dL — ABNORMAL HIGH (ref 6–23)
CO2: 31 mEq/L (ref 19–32)
Calcium: 9.1 mg/dL (ref 8.4–10.5)
Chloride: 99 mEq/L (ref 96–112)
Creatinine, Ser: 1.6 mg/dL — ABNORMAL HIGH (ref 0.4–1.2)
GFR: 32.11 mL/min — ABNORMAL LOW (ref >60.00)
Glucose, Bld: 94 mg/dL (ref 70–99)
Potassium: 5.4 mEq/L — ABNORMAL HIGH (ref 3.5–5.1)
Sodium: 139 mEq/L (ref 135–145)

## 2010-08-16 ENCOUNTER — Other Ambulatory Visit: Payer: Self-pay | Admitting: Internal Medicine

## 2010-08-16 ENCOUNTER — Ambulatory Visit
Admission: RE | Admit: 2010-08-16 | Discharge: 2010-08-16 | Payer: Self-pay | Source: Home / Self Care | Attending: Internal Medicine | Admitting: Internal Medicine

## 2010-08-16 LAB — IBC PANEL
Iron: 61 ug/dL (ref 42–145)
Saturation Ratios: 14.3 % — ABNORMAL LOW (ref 20.0–50.0)
Transferrin: 304.7 mg/dL (ref 212.0–360.0)

## 2010-08-16 LAB — VITAMIN B12: Vitamin B-12: 725 pg/mL (ref 211–911)

## 2010-08-16 LAB — FERRITIN: Ferritin: 29.8 ng/mL (ref 10.0–291.0)

## 2010-08-22 ENCOUNTER — Ambulatory Visit
Admission: RE | Admit: 2010-08-22 | Discharge: 2010-08-22 | Payer: Self-pay | Source: Home / Self Care | Attending: Internal Medicine | Admitting: Internal Medicine

## 2010-08-27 ENCOUNTER — Encounter: Payer: Self-pay | Admitting: Gastroenterology

## 2010-08-27 ENCOUNTER — Encounter (INDEPENDENT_AMBULATORY_CARE_PROVIDER_SITE_OTHER): Payer: Self-pay | Admitting: *Deleted

## 2010-09-06 NOTE — Progress Notes (Signed)
Summary: hydrocodone refill  Phone Note From Pharmacy   Summary of Call: patient is calling for a refill of hydrocodone is this okay to fill? Initial call taken by: Kern Reap CMA Duncan Dull),  August 08, 2009 10:44 AM  Follow-up for Phone Call        ok x one Follow-up by: Birdie Sons MD,  August 08, 2009 11:00 AM  Additional Follow-up for Phone Call Additional follow up Details #1::        rx called in Additional Follow-up by: Kern Reap CMA Duncan Dull),  August 08, 2009 12:19 PM

## 2010-09-06 NOTE — Assessment & Plan Note (Signed)
Summary: 3 mo rov/mm   Vital Signs:  Patient profile:   77 year old female Weight:      219 pounds Temp:     98.3 degrees F oral Pulse rate:   88 / minute Pulse rhythm:   regular BP sitting:   150 / 84  (left arm) Cuff size:   large  Vitals Entered By: Alfred Levins, CMA (August 09, 2010 10:05 AM) CC: f/u   Primary Care Provider:  Birdie Sons, MD  CC:  f/u.  History of Present Illness:  Follow-Up Visit      This is a 77 year old woman who presents for Follow-up visit.  The patient denies chest pain and palpitations.  Since the last visit the patient notes no new problems or concerns--has seen ortho for back and knee injections with great success.  The patient reports taking meds as prescribed.  When questioned about possible medication side effects, the patient notes none.  She has not tried to lose weight, exercise or follow a diet  chronic fatigue, chronic aches and pains otherwise All other systems reviewed and were negative   Current Medications (verified): 1)  Citalopram Hydrobromide 20 Mg Tabs (Citalopram Hydrobromide) .... Once Daily 2)  Lisinopril 40 Mg Tabs (Lisinopril) .... Take 1 Tablet By Mouth Once A Day 3)  Lorazepam 0.5 Mg Tabs (Lorazepam) .... One By Mouth Daily As Needed 4)  Omeprazole 40 Mg Cpdr (Omeprazole) .... One Tablet By Mouth Once Daily 5)  Furosemide 20 Mg Tabs (Furosemide) .... Once Daily 6)  Potassium Chloride Cr 10 Meq Cr-Tabs (Potassium Chloride) .... Take Two Daily 7)  Simvastatin 40 Mg  Tabs (Simvastatin) .Marland Kitchen.. 1qd 8)  Caltrate 600+d 600-400 Mg-Unit  Tabs (Calcium Carbonate-Vitamin D) .... Two Times A Day 9)  Multivitamins  Caps (Multiple Vitamin) .Marland Kitchen.. 1 Once Daily 10)  Tylenol Arthritis Pain 650 Mg Cr-Tabs (Acetaminophen) .... As Needed 11)  Terazol 3 0.8 % Crea (Terconazole) .... Apply As Needed Yeast Infection 12)  Labetalol Hcl 100 Mg Tabs (Labetalol Hcl) .... Take 1 Tablet By Mouth At Bedtime 13)  Aspirin 81 Mg Tabs (Aspirin)  .... Once Daily 14)  Oxycodone-Acetaminophen 5-325 Mg Tabs (Oxycodone-Acetaminophen) .... One By Mouth Every Six Hours As Needed Pain 15)  Tramadol Hcl 50 Mg Tabs (Tramadol Hcl) .Marland Kitchen.. 1 Tab By Mouth Two Times A Day As Needed Pain 16)  Fish Oil 1000 Mg Caps (Omega-3 Fatty Acids) .... Once Daily  Allergies (verified): 1)  ! Diltiazem Hcl Cr (Diltiazem Hcl)  Past History:  Past Surgical History: Appendectomy  1962 Cholecystectomy Oophorectomy, ? cause  1962 Parathyroidectomy, ? cause--? bones too thin, ?elevated PTH D & C 1962 wrist fx 10/01 uterine polyps"tail bone surgery" EGD with dilatation per Dr. Russella Dar T & A Ingrown toe nails x 3  Family History: Reviewed history from 06/06/2009 and no changes required. Father-deceased with renal failure--80 Mother deceased pneumonia, CHF --71yo Family History of Colon Cancer: oldest sister died 81  Social History: Reviewed history from 06/06/2009 and no changes required. Single Never Smoked Alcohol use-no Regular exercise-no Illicit Drug Use - no  Physical Exam  General:   obese female in no acute distress. HEENT exam atraumatic, normocephalic  extraocular muscles are intact. Neck is supple. Chest clear to auscultation cardiac exam S1 and S2 are regular. Abdominal exam obese, tomatoes, soft. Extremities no edema neurologic exam alert without motor or sensory deficits. She does have a broad-based gait.   Impression & Recommendations:  Problem #  1:  HYPERTENSION (ICD-401.9)  not as well controlled. Her home blood pressures range in the 120-130 70s range. she desperately needs weight loss. She understands this. Her updated medication list for this problem includes:    Lisinopril 40 Mg Tabs (Lisinopril) .Marland Kitchen... Take 1 tablet by mouth once a day    Furosemide 20 Mg Tabs (Furosemide) ..... Once daily    Labetalol Hcl 100 Mg Tabs (Labetalol hcl) .Marland Kitchen... Take 1 tablet by mouth at bedtime  BP today: 150/84 Prior BP: 126/78  (04/30/2010)  Labs Reviewed: K+: 5.2 (06/18/2010) Creat: : 1.7 (06/18/2010)   Chol: 160 (04/30/2010)   HDL: 50.80 (04/30/2010)   LDL: 75 (04/30/2010)   TG: 173.0 (04/30/2010)  Orders: TLB-BMP (Basic Metabolic Panel-BMET) (80048-METABOL)  Problem # 2:  HYPERLIPIDEMIA (ICD-272.4) controlled continue current medications  Her updated medication list for this problem includes:    Simvastatin 40 Mg Tabs (Simvastatin) .Marland Kitchen... 1qd  Labs Reviewed: SGOT: 19 (04/30/2010)   SGPT: 17 (04/30/2010)   HDL:50.80 (04/30/2010), 46.20 (11/07/2009)  LDL:75 (04/30/2010), 56 (11/07/2009)  Chol:160 (04/30/2010), 133 (11/07/2009)  Trig:173.0 (04/30/2010), 156.0 (11/07/2009)  Orders: Venipuncture (16109) TLB-CBC Platelet - w/Differential (85025-CBCD)  Problem # 3:  HYPERGLYCEMIA (ICD-790.29)  Labs Reviewed: Creat: 1.7 (06/18/2010)     Orders: TLB-A1C / Hgb A1C (Glycohemoglobin) (83036-A1C)  Problem # 4:  ANEMIA-UNSPECIFIED (ICD-285.9) needs f/u The following medications were removed from the medication list:    Vitamin B-12 5000 Mcg Subl (Cyanocobalamin) ..... Once daily  Hgb: 11.4 (04/30/2010)   Hct: 33.6 (04/30/2010)   Platelets: 235.0 (04/30/2010) RBC: 3.56 (04/30/2010)   RDW: 12.3 (04/30/2010)   WBC: 9.2 (04/30/2010) MCV: 94.3 (04/30/2010)   MCHC: 33.9 (04/30/2010) TSH: 1.82 (04/30/2010)  Complete Medication List: 1)  Citalopram Hydrobromide 20 Mg Tabs (Citalopram hydrobromide) .... Once daily 2)  Lisinopril 40 Mg Tabs (Lisinopril) .... Take 1 tablet by mouth once a day 3)  Lorazepam 0.5 Mg Tabs (Lorazepam) .... One by mouth daily as needed 4)  Omeprazole 40 Mg Cpdr (Omeprazole) .... One tablet by mouth once daily 5)  Furosemide 20 Mg Tabs (Furosemide) .... Once daily 6)  Potassium Chloride Cr 10 Meq Cr-tabs (Potassium chloride) .... Take two daily 7)  Simvastatin 40 Mg Tabs (Simvastatin) .Marland Kitchen.. 1qd 8)  Caltrate 600+d 600-400 Mg-unit Tabs (Calcium carbonate-vitamin d) .... Two times a day 9)   Multivitamins Caps (Multiple vitamin) .Marland Kitchen.. 1 once daily 10)  Tylenol Arthritis Pain 650 Mg Cr-tabs (Acetaminophen) .... As needed 11)  Terazol 3 0.8 % Crea (Terconazole) .... Apply as needed yeast infection 12)  Labetalol Hcl 100 Mg Tabs (Labetalol hcl) .... Take 1 tablet by mouth at bedtime 13)  Aspirin 81 Mg Tabs (Aspirin) .... Once daily 14)  Oxycodone-acetaminophen 5-325 Mg Tabs (Oxycodone-acetaminophen) .... One by mouth every six hours as needed pain 15)  Tramadol Hcl 50 Mg Tabs (Tramadol hcl) .Marland Kitchen.. 1 tab by mouth two times a day as needed pain 16)  Fish Oil 1000 Mg Caps (Omega-3 fatty acids) .... Once daily   Orders Added: 1)  Est. Patient Level IV [60454] 2)  Venipuncture [09811] 3)  TLB-CBC Platelet - w/Differential [85025-CBCD] 4)  TLB-BMP (Basic Metabolic Panel-BMET) [80048-METABOL] 5)  TLB-A1C / Hgb A1C (Glycohemoglobin) [83036-A1C]

## 2010-09-06 NOTE — Letter (Signed)
Summary: Pre Visit Letter Revised  [facility]  [address] [place], [place] [address]   Phone: [phone]  Fax: [phone]        08/27/2010 MRN: [id number] [facility] [address] [place], [place]  [address]             Procedure Date:  10/02/2010 @ 11:30   Recall colon-Dr. Russella Dar   Welcome to the Gastroenterology Division at [facility].    You are scheduled to see a nurse for your pre-procedure visit on 09/18/2010 at 10:30 on the 3rd floor at [facility], [address]  We ask that you try to arrive at our office 15 minutes prior to your appointment time to allow for check-in.  Please take a minute to review the attached form.  If you answer "Yes" to one or more of the questions on the first page, we ask that you call the person listed at your earliest opportunity.  If you answer "No" to all of the questions, please complete the rest of the form and bring it to your appointment.    Your nurse visit will consist of discussing your medical and surgical history, your immediate family medical history, and your medications.   If you are unable to list all of your medications on the form, please bring the medication bottles to your appointment and we will list them.  We will need to be aware of both prescribed and over the counter drugs.  We will need to know exact dosage information as well.    Please be prepared to read and sign documents such as consent forms, a financial agreement, and acknowledgement forms.  If necessary, and with your consent, a friend or relative is welcome to sit-in on the nurse visit with you.  Please bring your insurance card so that we may make a copy of it.  If your insurance requires a referral to see a specialist, please bring your referral form from your primary care physician.  No co-pay is required for this nurse visit.     If you cannot keep your appointment, please call [phone] to cancel or reschedule  prior to your appointment date.  This allows [place] the opportunity to schedule an appointment for another patient in need of care.    Thank you for choosing [facility] for your medical needs.  We appreciate the opportunity to care for you.  Please visit [place] at our website  to learn more about our practice.  Sincerely, The Gastroenterology Division

## 2010-09-06 NOTE — Progress Notes (Signed)
Summary: refill  Phone Note From Pharmacy   Summary of Call: patient is requesting a refill of hydrocodone is this okay to fill? Initial call taken by: Kern Reap CMA Duncan Dull),  October 26, 2009 11:49 AM  Follow-up for Phone Call        ok Follow-up by: Birdie Sons MD,  October 26, 2009 4:03 PM  Additional Follow-up for Phone Call Additional follow up Details #1::        Pharmacist called Additional Follow-up by: Kern Reap CMA Duncan Dull),  October 26, 2009 4:15 PM

## 2010-09-06 NOTE — Assessment & Plan Note (Signed)
Summary: 4 month rov/et rsc bmp/njr   Vital Signs:  Patient profile:   77 year old female Weight:      233 pounds Temp:     98 degrees F Pulse rate:   64 / minute Resp:     14 per minute BP sitting:   128 / 80  (left arm)  Vitals Entered By: Gladis Riffle, RN (August 18, 2009 2:28 PM)   Primary Care Provider:  Birdie Sons, MD   History of Present Illness:  Follow-Up Visit      This is a 77 year old woman who presents for Follow-up visit.  The patient denies chest pain, palpitations, dizziness, syncope, edema, SOB, DOE, PND, and orthopnea.  Since the last visit the patient notes being seen by a specialist.  The patient reports taking meds as prescribed.  When questioned about possible medication side effects, the patient notes none.    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 Take 1 Tablet By Mouth Once A Day 2)  Citalopram Hydrobromide 20 Mg Tabs (Citalopram Hydrobromide) .... Once Daily 3)  Lisinopril 40 Mg Tabs (Lisinopril) .... Once Daily 4)  Lorazepam 0.5 Mg Tabs (Lorazepam) .... One By Mouth Daily As Needed 5)  Omeprazole 40 Mg Cpdr (Omeprazole) .... One Tablet By Mouth Once Daily 6)  Furosemide 20 Mg Tabs (Furosemide) .... Once Daily 7)  Klor-Con M20 20 Meq  Tbcr (Potassium Chloride Crys Cr) .... Once Daily 8)  Simvastatin 40 Mg   Tabs (Simvastatin) .Marland Kitchen.. 1qd 9)  Caltrate 600+d 600-400 Mg-Unit  Tabs (Calcium Carbonate-Vitamin D) .... Two Times A Day 10)  Mylanta 200-200-20 Mg/92ml  Susp (Alum & Mag Hydroxide-Simeth) .... One Tsp Every Bedtime 11)  Sanctura Xr 60 Mg Xr24h-Cap (Trospium Chloride) .Marland Kitchen.. 1 Once Daily--Wait 30 Minutes Before Eating 12)  Multivitamins  Caps (Multiple Vitamin) .Marland Kitchen.. 1 Once Daily 13)  Tylenol Arthritis Pain 650 Mg Cr-Tabs (Acetaminophen) .... As Needed 14)  Terazol 3 0.8 % Crea (Terconazole) .... Apply As Needed Yeast Infection 15)  Hydrocodone-Acetaminophen 5-325 Mg Tabs (Hydrocodone-Acetaminophen) .Marland Kitchen.. 1 By Mouth Up To 4 Times Per Day As Needed For Pain  Allergies: 1)  ! Diltiazem Hcl Cr (Diltiazem Hcl)  Comments:  Nurse/Medical Assistant: 4 month rov--c/o pain back of head and neck; back so difficulty walking; pain in abdomen from navel down to perineum--requests to be checked for pancreatic cancer  The patient's medications were reviewed with the patient's parent and were updated in the Medication List. Gladis Riffle, RN (August 18, 2009 2:28 PM)  Past History:  Past Medical History: Last updated: 07/03/2009 Depression GERD Hyperlipidemia Hypertension Osteopenia Renal insufficiency  creat 1.3  2007 Internal hemorrhoids Hiatal hernia LBP positive stress cardiolyte, mild  Past Surgical History: Last updated: 06/30/2009 Appendectomy  1962 Cholecystectomy Oophorectomy, ? cause  1962 Parathyroidectomy, ? cause--? bones too thin, ?elevated PTH D & C 1962 wrist fx  10/01 uterine polyps"tail bone surgery" EGD with dilatation per Dr. Russella Dar T & A  Family History: Last updated: 2009-06-14 Father-deceased with renal failure--80 Mother deceased pneumonia, CHF --22yo Family History of Colon Cancer: oldest sister died 63  Social History: Last updated: 06/14/2009 Single Never Smoked Alcohol use-no Regular exercise-no Illicit Drug Use - no  Risk Factors: Exercise: no  (03/27/2007)  Risk Factors: Smoking Status: never (08/18/2009) PMH reviewed for relevance  Physical Exam  General:  alert pleasant obese 77 year old female Head:  normocephalic and atraumatic.   Eyes:  pupils equal and pupils round.   Neck:  no masses, thyromegaly, or abnormal cervical nodes.   FROM neck Chest Wall:  no deformities and no tenderness.   Lungs:  Normal respiratory effort, chest expands symmetrically. Lungs are clear to auscultation, no crackles or wheezes. Heart:  regular rhythm and rate Abdomen:  normal bowel sounds. Soft. Mild suprapubic tenderness. Msk:  FROM both shoulders Neurologic:  cranial nerves II-XII intact and gait normal.     Impression & Recommendations:  Problem # 1:  NECK PAIN (ICD-723.1) chronic problem no treatment zt this time The following medications were removed from the medication list:    Hydrocodone-acetaminophen 5-325 Mg Tabs (Hydrocodone-acetaminophen) .Marland Kitchen... 1 by mouth up to 4 times per day as needed for pain Her updated medication list for this problem includes:    Tylenol Arthritis Pain 650 Mg Cr-tabs (Acetaminophen) .Marland Kitchen... As needed  Problem # 2:  OBESITY (ICD-278.00) this is clearly her most significant medical problem  Problem # 3:  RENAL INSUFFICIENCY (ICD-588.9) continued follow up  Problem # 4:  HYPERTENSION (ICD-401.9) adequzte control on multiple medicztions Her updated medication list for this problem includes:    Bystolic 20 Mg Tabs (Nebivolol hcl) .Marland Kitchen... Take 1 tablet by mouth once a day    Lisinopril 40 Mg Tabs (Lisinopril) ..... Once daily    Furosemide 20 Mg Tabs (Furosemide) ..... Once daily  BP today: 128/80 Prior BP: 128/66 (07/03/2009)  Labs Reviewed: K+: 5.3 (06-14-2009) Creat: : 1.34 (Jun 14, 2009)   Chol: 151 (04/12/2009)   HDL: 49.10 (04/12/2009)   LDL: 80 (04/12/2009)   TG: 111.0 (04/12/2009)  Complete Medication List: 1)  Bystolic 20 Mg Tabs (Nebivolol hcl) .... Take 1 tablet by mouth once a day 2)   Citalopram Hydrobromide 20 Mg Tabs (Citalopram hydrobromide) .... Once daily 3)  Lisinopril 40 Mg Tabs (Lisinopril) .... Once daily 4)  Lorazepam 0.5 Mg Tabs (Lorazepam) .... One by mouth daily as needed 5)  Omeprazole 40 Mg Cpdr (Omeprazole) .... One tablet by mouth once daily 6)  Furosemide 20 Mg Tabs (Furosemide) .... Once daily 7)  Klor-con M20 20 Meq Tbcr (Potassium chloride crys cr) .... Once daily 8)  Simvastatin 40 Mg Tabs (Simvastatin) .Marland Kitchen.. 1qd 9)  Caltrate 600+d 600-400 Mg-unit Tabs (Calcium carbonate-vitamin d) .... Two times a day 10)  Mylanta 200-200-20 Mg/59ml Susp (Alum & mag hydroxide-simeth) .... One tsp every bedtime 11)  Sanctura Xr 60 Mg Xr24h-cap (Trospium chloride) .Marland Kitchen.. 1 once daily--wait 30 minutes before eating 12)  Multivitamins Caps (Multiple vitamin) .Marland Kitchen.. 1 once daily 13)  Tylenol Arthritis Pain 650 Mg Cr-tabs (Acetaminophen) .... As needed 14)  Terazol 3 0.8 % Crea (Terconazole) .... Apply as needed yeast infection  Patient Instructions: 1)  Please schedule a follow-up appointment in 4 months.

## 2010-09-06 NOTE — Assessment & Plan Note (Signed)
Summary: fup//ccm   Vital Signs:  Patient profile:   77 year old female Weight:      222 pounds BMI:     42.45 Temp:     97.7 degrees F oral Pulse rate:   72 / minute Pulse rhythm:   regular Resp:     14 per minute BP sitting:   108 / 64  (left arm) Cuff size:   large  Vitals Entered By: Gladis Riffle, RN (February 23, 2010 8:01 AM) CC: FU from ER visit for fall 02/19/10, takes 1/2 pain pill at hs so can sleep Is Patient Diabetic? No Comments states was walking up a curb, left foot was in air and right knee gave out   Primary Care Provider:  Birdie Sons, MD  CC:  FU from ER visit for fall 02/19/10 and takes 1/2 pain pill at hs so can sleep.  History of Present Illness:  Follow-Up Visit      The patient denies chest pain and palpitations.  Since the last visit the patient notes a recent ED visit--fell 02/19/10. Reviewed ED record and CT scan. she fell when she tried to step up on a step---lst balance and hit forehead (glasses hit forehead---abrasion).  The patient reports taking meds as prescribed.  When questioned about possible medication side effects, the patient notes none.    All other systems reviewed and were negative  pt is brought to office by a friend who is present during the entire hx and exam    Preventive Screening-Counseling & Management  Alcohol-Tobacco     Smoking Status: never  Current Problems (verified): 1)  Anemia-unspecified  (ICD-285.9) 2)  Osteoarthritis, Knee, Right  (ICD-715.96) 3)  Obesity  (ICD-278.00) 4)  Hyperglycemia  (ICD-790.29) 5)  Renal Insufficiency  (ICD-588.9) 6)  Osteopenia  (ICD-733.90) 7)  Hypertension  (ICD-401.9) 8)  Hyperlipidemia  (ICD-272.4) 9)  Gerd  (ICD-530.81) 10)  Depression  (ICD-311)  Current Medications (verified): 1)  Citalopram Hydrobromide 20 Mg Tabs (Citalopram Hydrobromide) .... Once Daily 2)  Lisinopril 40 Mg Tabs (Lisinopril) .... Take 1 Tablet By Mouth Once A Day 3)  Lorazepam 0.5 Mg Tabs (Lorazepam) .... One  By Mouth Daily As Needed 4)  Omeprazole 40 Mg Cpdr (Omeprazole) .... One Tablet By Mouth Once Daily 5)  Furosemide 20 Mg Tabs (Furosemide) .... Once Daily 6)  Klor-Con M20 20 Meq  Tbcr (Potassium Chloride Crys Cr) .... Once Daily 7)  Simvastatin 40 Mg  Tabs (Simvastatin) .Marland Kitchen.. 1qd 8)  Caltrate 600+d 600-400 Mg-Unit  Tabs (Calcium Carbonate-Vitamin D) .... Two Times A Day 9)  Mylanta 200-200-20 Mg/2ml  Susp (Alum & Mag Hydroxide-Simeth) .... One Tsp Every Bedtime 10)  Multivitamins  Caps (Multiple Vitamin) .Marland Kitchen.. 1 Once Daily 11)  Tylenol Arthritis Pain 650 Mg Cr-Tabs (Acetaminophen) .... As Needed 12)  Terazol 3 0.8 % Crea (Terconazole) .... Apply As Needed Yeast Infection 13)  Labetalol Hcl 200 Mg Tabs (Labetalol Hcl) .... 1/2 By Mouth At Bedtime 14)  Aspirin 81 Mg Tabs (Aspirin) .... Once Daily 15)  Oxycodone-Acetaminophen 5-325 Mg Tabs (Oxycodone-Acetaminophen) .... One By Mouth Every Six Hours As Needed Pain  Allergies (verified): 1)  ! Diltiazem Hcl Cr (Diltiazem Hcl)  Past History:  Past Medical History: Last updated: 07/03/2009 Depression GERD Hyperlipidemia Hypertension Osteopenia Renal insufficiency  creat 1.3  2007 Internal hemorrhoids Hiatal hernia LBP positive stress cardiolyte, mild  Past Surgical History: Last updated: 06/30/2009 Appendectomy  1962 Cholecystectomy Oophorectomy, ? cause  1962 Parathyroidectomy, ? cause--? bones too  thin, ?elevated PTH D & C 1962 wrist fx 10/01 uterine polyps"tail bone surgery" EGD with dilatation per Dr. Russella Dar T & A  Family History: Last updated: 06/07/09 Father-deceased with renal failure--80 Mother deceased pneumonia, CHF --36yo Family History of Colon Cancer: oldest sister died 79  Social History: Last updated: Jun 07, 2009 Single Never Smoked Alcohol use-no Regular exercise-no Illicit Drug Use - no  Risk Factors: Exercise: no (03/27/2007)  Risk Factors: Smoking Status: never (02/23/2010)  Physical  Exam  General:  alert and well-developed.   Head:  normocephalic and bruising of left eye and abrasion left forehead Eyes:  pupils equal and pupils round.   Ears:  R ear normal and L ear normal.   Neck:  no masses, thyromegaly, or abnormal cervical nodes.   Chest Wall:  no deformities and no tenderness.   Lungs:  normal respiratory effort and no intercostal retractions.   Heart:  normal rate and regular rhythm.   Abdomen:  soft and non-tender.   Msk:  No deformity or scoliosis noted of thoracic or lumbar spine.   Pulses:  R radial normal and L radial normal.   Neurologic:  cranial nerves II-XII intact and gait normal.   Skin:  turgor normal and color normal.  bruising over left knee Psych:  Oriented X3 and memory intact for recent and remote.     Impression & Recommendations:  Problem # 1:  CONTUSION OF FACE SCALP AND NECK EXCEPT EYE (ICD-920)  reviewed records patient reassured  she did have a fall---she is concerned with future falls---refer to PT  Orders: Physical Therapy Referral (PT)  Problem # 2:  OBESITY (ICD-278.00) this is main issue she desperately needs to lose weight discussed low calorei diet, regular and safe exercise  Problem # 3:  HYPERLIPIDEMIA (ICD-272.4) reviewed labs continue current medications  Her updated medication list for this problem includes:    Simvastatin 40 Mg Tabs (Simvastatin) .Marland Kitchen... 1qd  Labs Reviewed: SGOT: 26 (11/07/2009)   SGPT: 22 (11/07/2009)   HDL:46.20 (11/07/2009), 49.10 (04/12/2009)  LDL:56 (11/07/2009), 80 (04/12/2009)  Chol:133 (11/07/2009), 151 (04/12/2009)  Trig:156.0 (11/07/2009), 111.0 (04/12/2009)  Complete Medication List: 1)  Citalopram Hydrobromide 20 Mg Tabs (Citalopram hydrobromide) .... Once daily 2)  Lisinopril 40 Mg Tabs (Lisinopril) .... Take 1 tablet by mouth once a day 3)  Lorazepam 0.5 Mg Tabs (Lorazepam) .... One by mouth daily as needed 4)  Omeprazole 40 Mg Cpdr (Omeprazole) .... One tablet by mouth once  daily 5)  Furosemide 20 Mg Tabs (Furosemide) .... Once daily 6)  Klor-con M20 20 Meq Tbcr (Potassium chloride crys cr) .... Once daily 7)  Simvastatin 40 Mg Tabs (Simvastatin) .Marland Kitchen.. 1qd 8)  Caltrate 600+d 600-400 Mg-unit Tabs (Calcium carbonate-vitamin d) .... Two times a day 9)  Mylanta 200-200-20 Mg/50ml Susp (Alum & mag hydroxide-simeth) .... One tsp every bedtime 10)  Multivitamins Caps (Multiple vitamin) .Marland Kitchen.. 1 once daily 11)  Tylenol Arthritis Pain 650 Mg Cr-tabs (Acetaminophen) .... As needed 12)  Terazol 3 0.8 % Crea (Terconazole) .... Apply as needed yeast infection 13)  Labetalol Hcl 200 Mg Tabs (Labetalol hcl) .... 1/2 by mouth at bedtime 14)  Aspirin 81 Mg Tabs (Aspirin) .... Once daily 15)  Oxycodone-acetaminophen 5-325 Mg Tabs (Oxycodone-acetaminophen) .... One by mouth every six hours as needed pain

## 2010-09-06 NOTE — Miscellaneous (Signed)
Summary: Orders Update  Clinical Lists Changes  Orders: Added new Test order of Venous Duplex Lower Extremity (Venous Duplex Lower) - Signed 

## 2010-09-06 NOTE — Progress Notes (Signed)
Summary: Tramadol refill  Phone Note Refill Request Message from:  Fax from Pharmacy on June 08, 2010 10:01 AM  Refills Requested: Medication #1:  TRAMADOL HCL 50 MG TABS 1 tab by mouth two times a day as needed pain.   Dosage confirmed as above?Dosage Confirmed please advise?  Send to [facility]  Initial call taken by: Josph Macho RMA,  June 08, 2010 10:01 AM  Follow-up for Phone Call        ok #30/1 refill Follow-up by: Birdie Sons MD,  June 08, 2010 12:07 PM  Additional Follow-up for Phone Call Additional follow up Details #1::        Rx faxed to pharmacy Additional Follow-up by: Alfred Levins, CMA,  June 08, 2010 3:44 PM    Prescriptions: TRAMADOL HCL 50 MG TABS (TRAMADOL HCL) 1 tab by mouth two times a day as needed pain  #30 x 1   Entered by:   Alfred Levins, CMA   Authorized by:   Birdie Sons MD   Signed by:   Alfred Levins, CMA on 06/08/2010   Method used:   Faxed to ...       [facility] Public [facility] account executive (retail)       [address] [place]       [address]       [place], [place]  [address]  [place]       Ph: [phone]       Fax: [phone]   RxID:   [id number]

## 2010-09-06 NOTE — Assessment & Plan Note (Signed)
Summary: post hos fup/njr   Vital Signs:  Patient profile:   77 year old female Weight:      226 pounds BMI:     43.21 Temp:     98.3 degrees F oral Pulse rate:   76 / minute Pulse rhythm:   regular Resp:     16 per minute BP sitting:   130 / 78  (left arm) Cuff size:   large  Vitals Entered By: Gladis Riffle, RN (November 21, 2009 11:01 AM)  Nutrition Counseling: Patient's BMI is greater than 25 and therefore counseled on weight management options. CC: hospital FU for atypical chest pain that was diagnosed as hiatal hernia--c/o pain right side from front to back since 11/13/09 Is Patient Diabetic? No Comments BP has been 121/69-157/60 at home   Primary Care Provider:  Birdie Sons, MD  CC:  hospital FU for atypical chest pain that was diagnosed as hiatal hernia--c/o pain right side from front to back since 11/13/09.  History of Present Illness:  Follow-Up Visit      This is a 77 year old woman who presents for Follow-up visit.  The patient denies chest pain and palpitations.  Since the last visit the patient notes a recent hospitilization.  The patient reports taking meds as prescribed.  When questioned about possible medication side effects, the patient notes none.  had CP related to GERD---reviewed hospital records---note kidney function  All other systems reviewed and were negative   Preventive Screening-Counseling & Management  Alcohol-Tobacco     Smoking Status: never  Current Problems (verified): 1)  Anemia-unspecified  (ICD-285.9) 2)  Osteoarthritis, Knee, Right  (ICD-715.96) 3)  Obesity  (ICD-278.00) 4)  Hyperglycemia  (ICD-790.29) 5)  Renal Insufficiency  (ICD-588.9) 6)  Osteopenia  (ICD-733.90) 7)  Hypertension  (ICD-401.9) 8)  Hyperlipidemia  (ICD-272.4) 9)  Gerd  (ICD-530.81) 10)  Depression  (ICD-311)  Current Medications (verified): 1)  Citalopram Hydrobromide 20 Mg Tabs (Citalopram Hydrobromide) .... Once Daily 2)  Lisinopril 40 Mg Tabs (Lisinopril) ....  Take 1 Tablet By Mouth Once A Day 3)  Lorazepam 0.5 Mg Tabs (Lorazepam) .... One By Mouth Daily As Needed 4)  Omeprazole 40 Mg Cpdr (Omeprazole) .... One Tablet By Mouth Once Daily 5)  Furosemide 20 Mg Tabs (Furosemide) .... Once Daily 6)  Klor-Con M20 20 Meq  Tbcr (Potassium Chloride Crys Cr) .... Once Daily 7)  Simvastatin 40 Mg  Tabs (Simvastatin) .Marland Kitchen.. 1qd 8)  Caltrate 600+d 600-400 Mg-Unit  Tabs (Calcium Carbonate-Vitamin D) .... Two Times A Day 9)  Mylanta 200-200-20 Mg/78ml  Susp (Alum & Mag Hydroxide-Simeth) .... One Tsp Every Bedtime 10)  Multivitamins  Caps (Multiple Vitamin) .Marland Kitchen.. 1 Once Daily 11)  Tylenol Arthritis Pain 650 Mg Cr-Tabs (Acetaminophen) .... As Needed 12)  Terazol 3 0.8 % Crea (Terconazole) .... Apply As Needed Yeast Infection 13)  Labetalol Hcl 200 Mg Tabs (Labetalol Hcl) .... 1/2 By Mouth At Bedtime 14)  Aspirin 81 Mg Tabs (Aspirin) .... Once Daily  Allergies: 1)  ! Diltiazem Hcl Cr (Diltiazem Hcl)  Physical Exam  General:  alert pleasant obese 77 year old female Head:  normocephalic and atraumatic.   Eyes:  pupils equal and pupils round.   Ears:  R ear normal and L ear normal.   Neck:  no masses, thyromegaly, or abnormal cervical nodes.   FROM neck Lungs:  normal respiratory effort and no accessory muscle use.   Heart:  normal rate and regular rhythm.   Abdomen:  normal  bowel sounds. Soft. Mild suprapubic tenderness. Msk:  No deformity or scoliosis noted of thoracic or lumbar spine.   Neurologic:  cranial nerves II-XII intact and gait normal.     Impression & Recommendations:  Problem # 1:  RENAL INSUFFICIENCY (ICD-588.9) reviewed hospital labs no need for f/u at this time (see echart)  Problem # 2:  HYPERTENSION (ICD-401.9) reasonable control given weight continue current medications  Her updated medication list for this problem includes:    Lisinopril 40 Mg Tabs (Lisinopril) .Marland Kitchen... Take 1 tablet by mouth once a day    Furosemide 20 Mg Tabs  (Furosemide) ..... Once daily    Labetalol Hcl 200 Mg Tabs (Labetalol hcl) .Marland Kitchen... 1/2 by mouth at bedtime  BP today: 130/78 Prior BP: 142/72 (11/07/2009)  Labs Reviewed: K+: 4.5 (11/07/2009) Creat: : 1.5 (11/07/2009)   Chol: 133 (11/07/2009)   HDL: 46.20 (11/07/2009)   LDL: 56 (11/07/2009)   TG: 156.0 (11/07/2009)  Problem # 3:  OBESITY (ICD-278.00) she has gained 2 pounds encouraged daily activity and low calorie diet  Problem # 4:  GERD (ICD-530.81) no sxs continue current medications  Her updated medication list for this problem includes:    Omeprazole 40 Mg Cpdr (Omeprazole) ..... One tablet by mouth once daily    Mylanta 200-200-20 Mg/61ml Susp (Alum & mag hydroxide-simeth) ..... One tsp every bedtime  EGD: DONE (07/11/2009)  Labs Reviewed: Hgb: 12.1 (11/07/2009)   Hct: 35.1 (11/07/2009)  Problem # 5:  OSTEOPENIA (ICD-733.90) weight bearing exercise encouraged reviewed dexa scan  Complete Medication List: 1)  Citalopram Hydrobromide 20 Mg Tabs (Citalopram hydrobromide) .... Once daily 2)  Lisinopril 40 Mg Tabs (Lisinopril) .... Take 1 tablet by mouth once a day 3)  Lorazepam 0.5 Mg Tabs (Lorazepam) .... One by mouth daily as needed 4)  Omeprazole 40 Mg Cpdr (Omeprazole) .... One tablet by mouth once daily 5)  Furosemide 20 Mg Tabs (Furosemide) .... Once daily 6)  Klor-con M20 20 Meq Tbcr (Potassium chloride crys cr) .... Once daily 7)  Simvastatin 40 Mg Tabs (Simvastatin) .Marland Kitchen.. 1qd 8)  Caltrate 600+d 600-400 Mg-unit Tabs (Calcium carbonate-vitamin d) .... Two times a day 9)  Mylanta 200-200-20 Mg/77ml Susp (Alum & mag hydroxide-simeth) .... One tsp every bedtime 10)  Multivitamins Caps (Multiple vitamin) .Marland Kitchen.. 1 once daily 11)  Tylenol Arthritis Pain 650 Mg Cr-tabs (Acetaminophen) .... As needed 12)  Terazol 3 0.8 % Crea (Terconazole) .... Apply as needed yeast infection 13)  Labetalol Hcl 200 Mg Tabs (Labetalol hcl) .... 1/2 by mouth at bedtime 14)  Aspirin 81 Mg Tabs  (Aspirin) .... Once daily

## 2010-09-06 NOTE — Progress Notes (Signed)
Summary: BP questions  Phone Note Call from Patient   Caller: Patient Call For: Birdie Sons MD Summary of Call: Feels dizzy and her BP is 100/67 since starting new BP meds.  Advised to hydrate, and have the nurses monitor, but her BP is WNL.  She may be adjusting to  a more normal BP. Initial call taken by: Lynann Beaver CMA,  October 23, 2009 4:13 PM  Follow-up for Phone Call        decrease lisinopril to 40 mg 1/2 by mouth once daily  Follow-up by: Birdie Sons MD,  October 23, 2009 4:16 PM  Additional Follow-up for Phone Call Additional follow up Details #1::        Pt notified. Additional Follow-up by: Lynann Beaver CMA,  October 23, 2009 4:22 PM    New/Updated Medications: LISINOPRIL 40 MG TABS (LISINOPRIL) 1/2 by mouth daily

## 2010-09-06 NOTE — Miscellaneous (Signed)
Summary: Discharge from PT/[facility]  Discharge from [facility]   Imported By: Sherian Rein 04/13/2010 09:37:38  _____________________________________________________________________  External Attachment:    Type:   Image     Comment:   External Document

## 2010-09-06 NOTE — Progress Notes (Signed)
Summary: change med  Phone Note Call from Patient Call back at Home Phone [phone]   Caller: Patient Call For: swords Summary of Call: bystolic is $45, she has medicare and she can't afford it along with all her other  meds.  Wants a 90 day supply of a generic med called into [facility]  Initial call taken by: Alfred Levins, CMA,  October 09, 2009 8:35 AM  Follow-up for Phone Call        can change to labetolol 200 mg by mouth two times a day  stop bystolic Follow-up by: Birdie Sons MD,  October 09, 2009 1:40 PM    New/Updated Medications: LABETALOL HCL 200 MG TABS (LABETALOL HCL) one by mouth bid Prescriptions: LABETALOL HCL 200 MG TABS (LABETALOL HCL) one by mouth bid  #60 x 6   Entered by:   Lynann Beaver CMA   Authorized by:   Birdie Sons MD   Signed by:   Lynann Beaver CMA on 10/09/2009   Method used:   Electronically to        [facility]  [address] # [facility]50* (retail)       [address]       [place], [place]  [address]       Ph: [phone] or [phone]       Fax: [phone]   RxID:   [id number]  Pt. notified.

## 2010-09-06 NOTE — Assessment & Plan Note (Signed)
Summary: KNEE PAIN/NJR   Vital Signs:  Patient profile:   77 year old female Height:      60.75 inches (154.31 cm) Weight:      221 pounds O2 Sat:      96 % on Room air Temp:     97.6 degrees F oral Pulse rate:   61 / minute BP sitting:   110 / 70  (left arm) Cuff size:   large  Vitals Entered By: Romualdo Bolk, CMA (AAMA) (March 23, 2010 9:47 AM)  O2 Flow:  Room air CC: Rt Knee Pain x 1 week   History of Present Illness: Patient in today for evaluation of worsening right knee pain. She has struggled with arthritis in the knees in the past. This time she had the sudden onset of swelling in the back of her right knee this time and some pain in the upper posterior calf as well. No swelling or warmth in the calf. She denies any trauma this month although she did fall  in mid July. No CP/palp/SOB/f/cGI or GU c/o. No pain radiating down leg to foot.  Preventive Screening-Counseling & Management  Alcohol-Tobacco     Smoking Status: never  Caffeine-Diet-Exercise     Does Patient Exercise: no  Current Medications (verified): 1)  Citalopram Hydrobromide 20 Mg Tabs (Citalopram Hydrobromide) .... Once Daily 2)  Lisinopril 40 Mg Tabs (Lisinopril) .... Take 1 Tablet By Mouth Once A Day 3)  Lorazepam 0.5 Mg Tabs (Lorazepam) .... One By Mouth Daily As Needed 4)  Omeprazole 40 Mg Cpdr (Omeprazole) .... One Tablet By Mouth Once Daily 5)  Furosemide 20 Mg Tabs (Furosemide) .... Once Daily 6)  Potassium Chloride Cr 10 Meq Cr-Tabs (Potassium Chloride) .... Take Two Daily 7)  Simvastatin 40 Mg  Tabs (Simvastatin) .Marland Kitchen.. 1qd 8)  Caltrate 600+d 600-400 Mg-Unit  Tabs (Calcium Carbonate-Vitamin D) .... Two Times A Day 9)  Mylanta 200-200-20 Mg/66ml  Susp (Alum & Mag Hydroxide-Simeth) .... One Tsp Every Bedtime 10)  Multivitamins  Caps (Multiple Vitamin) .Marland Kitchen.. 1 Once Daily 11)  Tylenol Arthritis Pain 650 Mg Cr-Tabs (Acetaminophen) .... As Needed 12)  Terazol 3 0.8 % Crea (Terconazole) .... Apply  As Needed Yeast Infection 13)  Labetalol Hcl 100 Mg Tabs (Labetalol Hcl) .... Take 1 Tablet By Mouth At Bedtime 14)  Aspirin 81 Mg Tabs (Aspirin) .... Once Daily 15)  Oxycodone-Acetaminophen 5-325 Mg Tabs (Oxycodone-Acetaminophen) .... One By Mouth Every Six Hours As Needed Pain 16)  Vitamin B-12 5000 Mcg Subl (Cyanocobalamin) .... Once Daily  Allergies (verified): 1)  ! Diltiazem Hcl Cr (Diltiazem Hcl)  Past History:  Past medical history reviewed for relevance to current acute and chronic problems. Social history (including risk factors) reviewed for relevance to current acute and chronic problems.  Past Medical History: Reviewed history from 07/03/2009 and no changes required. Depression GERD Hyperlipidemia Hypertension Osteopenia Renal insufficiency  creat 1.3  2007 Internal hemorrhoids Hiatal hernia LBP positive stress cardiolyte, mild  Social History: Reviewed history from 06/06/2009 and no changes required. Single Never Smoked Alcohol use-no Regular exercise-no Illicit Drug Use - no  Review of Systems      See HPI  Physical Exam  General:  Well-developed,well-nourished,in no acute distress; alert,appropriate and cooperative throughout examination Lungs:  Normal respiratory effort, chest expands symmetrically. Lungs are clear to auscultation, no crackles or wheezes. Heart:  Normal rate and regular rhythm. S1 and S2 normal without gallop, click, rub or other extra sounds.grade 2 /6 systolic murmur.  Abdomen:  Bowel sounds positive,abdomen soft and non-tender without masses, organomegaly or hernias noted. Msk:  mild swelling and pain noted behind right knee, none behind left knee. Neg Homan's b/l. no joint line tenderness, ligamentous laxity or anterior swelling in either knee Extremities:  No clubbing, cyanosis, or deformity noted with normal full range of motion of all joints.  1+ left pedal edema and 1+ right pedal edema.  nonpitting Psych:  Cognition and  judgment appear intact. Alert and cooperative with normal attention span and concentration. No apparent delusions, illusions, hallucinations   Impression & Recommendations:  Problem # 1:  CALF PAIN, RIGHT (ICD-729.5)  Orders: Doppler Referral (Doppler): RLE r/o DVT Given Tramadol for pain and asked to apply moist heat as needed  Problem # 2:  KNEE PAIN, RIGHT (ICD-719.46)  Her updated medication list for this problem includes:    Tylenol Arthritis Pain 650 Mg Cr-tabs (Acetaminophen) .Marland Kitchen... As needed    Aspirin 81 Mg Tabs (Aspirin) ..... Once daily    Oxycodone-acetaminophen 5-325 Mg Tabs (Oxycodone-acetaminophen) ..... One by mouth every six hours as needed pain    Tramadol Hcl 50 Mg Tabs (Tramadol hcl) .Marland Kitchen... 1 tab by mouth two times a day as needed pain  Orders: Doppler Referral (Doppler) Orthopedic Referral (Ortho) Prescription Created Electronically [id number]) for knee pain and probable polpliteal fossa cyst  Problem # 3:  HYPERTENSION (ICD-401.9)  Her updated medication list for this problem includes:    Lisinopril 40 Mg Tabs (Lisinopril) .Marland Kitchen... Take 1 tablet by mouth once a day    Furosemide 20 Mg Tabs (Furosemide) ..... Once daily    Labetalol Hcl 100 Mg Tabs (Labetalol hcl) .Marland Kitchen... Take 1 tablet by mouth at bedtime Well controlled on current meds no changes  Complete Medication List: 1)  Citalopram Hydrobromide 20 Mg Tabs (Citalopram hydrobromide) .... Once daily 2)  Lisinopril 40 Mg Tabs (Lisinopril) .... Take 1 tablet by mouth once a day 3)  Lorazepam 0.5 Mg Tabs (Lorazepam) .... One by mouth daily as needed 4)  Omeprazole 40 Mg Cpdr (Omeprazole) .... One tablet by mouth once daily 5)  Furosemide 20 Mg Tabs (Furosemide) .... Once daily 6)  Potassium Chloride Cr 10 Meq Cr-tabs (Potassium chloride) .... Take two daily 7)  Simvastatin 40 Mg Tabs (Simvastatin) .Marland Kitchen.. 1qd 8)  Caltrate 600+d 600-400 Mg-unit Tabs (Calcium carbonate-vitamin d) .... Two times a day 9)  Mylanta  200-200-20 Mg/27ml Susp (Alum & mag hydroxide-simeth) .... One tsp every bedtime 10)  Multivitamins Caps (Multiple vitamin) .Marland Kitchen.. 1 once daily 11)  Tylenol Arthritis Pain 650 Mg Cr-tabs (Acetaminophen) .... As needed 12)  Terazol 3 0.8 % Crea (Terconazole) .... Apply as needed yeast infection 13)  Labetalol Hcl 100 Mg Tabs (Labetalol hcl) .... Take 1 tablet by mouth at bedtime 14)  Aspirin 81 Mg Tabs (Aspirin) .... Once daily 15)  Oxycodone-acetaminophen 5-325 Mg Tabs (Oxycodone-acetaminophen) .... One by mouth every six hours as needed pain 16)  Vitamin B-12 5000 Mcg Subl (Cyanocobalamin) .... Once daily 17)  Tramadol Hcl 50 Mg Tabs (Tramadol hcl) .Marland Kitchen.. 1 tab by mouth two times a day as needed pain  Other Orders: Iron Binding Cap (TIBC)-[facility] ([id number])  Patient Instructions: 1)  Please schedule a follow-up appointment as needed if symptoms worsen or do not improve. 2)  You may move around but avoid painful motions. Apply ice to sore area for 20 minutes 3-4 times a day for 2-3 days.  Prescriptions: TRAMADOL HCL 50 MG TABS (TRAMADOL HCL) 1 tab by mouth  two times a day as needed pain  #40 x 1   Entered and Authorized by:   Danise Edge MD   Signed by:   Danise Edge MD on 03/23/2010   Method used:   Print then Give to Patient   RxID:   [id number]

## 2010-09-06 NOTE — Letter (Signed)
Summary: Colonoscopy Letter  [facility]  [address] [place], [place] [address]   Phone: [phone]  Fax: [phone]      April 19, 2010 MRN: [id number]   [facility] [address] [place], [place]  [address]   Dear Linda Bentley,   According to your medical record, it is time for you to schedule a Colonoscopy. The American Cancer Society recommends this procedure as a method to detect early colon cancer. Patients with a family history of colon cancer, or a personal history of colon polyps or inflammatory bowel disease are at increased risk.  This letter has beeen generated based on the recommendations made at the time of your procedure. If you feel that in your particular situation this may no longer apply, please contact our office.  Please call our office at [phone] to schedule this appointment or to update your records at your earliest convenience.  Thank you for cooperating with [place] to provide you with the very best care possible.   Sincerely,  Judie Petit T. Russella Dar, M.D.  [facility] [phone]

## 2010-09-06 NOTE — Progress Notes (Signed)
Summary: new information on meds from Pt.  Phone Note Call from Patient   Caller: Patient Call For: Birdie Sons MD Summary of Call: When talking to pt again, she states she has not taken Lisinopril x 3 - 4 days.  She ran out.  She has developed chills and ? fever this afternoon.  Do you still want to decrease her meds???  She will have to get it filled again.  Advised her not to do anything differently tonight until we can talk to Dr. Cato Mulligan, and will call her tomorrow. Initial call taken by: Lynann Beaver CMA,  October 23, 2009 4:45 PM  Follow-up for Phone Call        stay off lisinopril unitl I see her again Follow-up by: Birdie Sons MD,  October 24, 2009 12:42 PM  Additional Follow-up for Phone Call Additional follow up Details #1::        PT WALKED IN AND IS AWARE OF ABOVE. WILL SEE DR Lesleyann Fichter ON 11-07-2009. Additional Follow-up by: Warnell Forester,  October 25, 2009 10:18 AM

## 2010-09-06 NOTE — Progress Notes (Signed)
Summary: Pt req refill of Lorazepam to [facility]  Phone Note Refill Request   Refills Requested: Medication #1:  LORAZEPAM 0.5 MG TABS one by mouth daily as needed   Dosage confirmed as above?Dosage Confirmed Please call this in to [facility] [phone]. Pt has been out of med all week.    Method Requested: Telephone to Pharmacy Initial call taken by: Lucy Antigua,  July 12, 2010 10:15 AM  Follow-up for Phone Call        Rx called to pharmacy Follow-up by: Alfred Levins, CMA,  July 13, 2010 7:52 AM    Prescriptions: LORAZEPAM 0.5 MG TABS (LORAZEPAM) one by mouth daily as needed  #30 x 3   Entered by:   Alfred Levins, CMA   Authorized by:   Birdie Sons MD   Signed by:   Alfred Levins, CMA on 07/13/2010   Method used:   Telephoned to ...       [facility]  [address] # [facility]50* (retail)       [address]       [place], [place]  [address]       Ph: [phone] or [phone]       Fax: [phone]   RxID:   [id number]

## 2010-09-06 NOTE — Progress Notes (Signed)
Summary: [facility] called re: scripts  Phone Note Refill Request Message from:  Pharmacy on March 07, 2010 1:22 PM  Refills Requested: Medication #1:  CITALOPRAM HYDROBROMIDE 20 MG TABS once daily   Dosage confirmed as above?Dosage Confirmed   Supply Requested: 3 months  Medication #2:  LABETALOL HCL 200 MG TABS 1/2 by mouth at bedtime   Dosage confirmed as above?Dosage Confirmed   Supply Requested: 3 months  Medication #3:  LORAZEPAM 0.5 MG TABS one by mouth daily as needed   Dosage confirmed as above?Dosage Confirmed   Supply Requested: 1 month LABETALOL HCL 200 MG TABS 1/2 by mouth at bedtime. Req this to be changed to 100mg  take 1 at bedtime, so pt will not have to cut pill in half.     [facility], [address]; [place], [place] [address] phone [phone]  fax [phone]   Method Requested: Telephone to [facility] Initial call taken by: Lucy Antigua,  March 07, 2010 1:27 PM  Follow-up for Phone Call        Rx done and to be faxed to pharmacy as requested. Follow-up by: Gladis Riffle, RN,  March 07, 2010 2:53 PM    New/Updated Medications: LABETALOL HCL 100 MG TABS (LABETALOL HCL) Take 1 tablet by mouth at bedtime Prescriptions: LABETALOL HCL 100 MG TABS (LABETALOL HCL) Take 1 tablet by mouth at bedtime  #90 x 3   Entered by:   Gladis Riffle, RN   Authorized by:   Birdie Sons MD   Signed by:   Gladis Riffle, RN on 03/07/2010   Method used:   Printed then faxed to ...       [facility] Public relations account executive (retail)       [address]       [address]       [place], [place]  [address]  [place]       Ph: [phone]       Fax: [phone]   RxID:   [id number] LORAZEPAM 0.5 MG TABS (LORAZEPAM) one by mouth daily as needed  #30 x 3   Entered by:   Gladis Riffle, RN   Authorized by:   Birdie Sons MD   Signed by:   Gladis Riffle, RN on 03/07/2010   Method used:   Printed then faxed to ...       [facility]  Drug Public relations account executive (retail)       [address] [place]       [address]       [place], [place]  [address]  [place]       Ph: [phone]       Fax: [phone]   RxID:   [id number] CITALOPRAM HYDROBROMIDE 20 MG TABS (CITALOPRAM HYDROBROMIDE) once daily  #90 Tablet x 3   Entered by:   Gladis Riffle, RN   Authorized by:   Birdie Sons MD   Signed by:   Gladis Riffle, RN on 03/07/2010   Method used:   Printed then faxed to ...       [facility] Public relations account executive (retail)       [address] [place]       [address]       [place], [place]  [address]  [place]       Ph: [phone]       Fax: [phone]   RxID:   [id number]

## 2010-09-06 NOTE — Progress Notes (Signed)
----   Converted from flag ---- ---- 03/26/2010 11:49 AM, Danise Edge MD wrote: Please notify patient study negative for any acute cyst or clot  ---- 03/26/2010 10:54 AM, Missy Al-Rammal, RVT, RDCS wrote: Right leg negative for DVT, or Bakers cyst ------------------------------  Phone Note Outgoing Call   Summary of Call: Informed pt Initial call taken by: Josph Macho RMA,  March 26, 2010 1:21 PM

## 2010-09-06 NOTE — Miscellaneous (Signed)
Summary: BONE DENSITY  Clinical Lists Changes  Orders: Added new Test order of T-Bone Densitometry (77080) - Signed Added new Test order of T-Lumbar Vertebral Assessment (77082) - Signed 

## 2010-09-06 NOTE — Miscellaneous (Signed)
Summary: Orders Update  Clinical Lists Changes  Problems: Added new problem of UNSPECIFIED PERIPHERAL VASCULAR DISEASE (ICD-443.9) Orders: Added new Test order of Arterial Duplex Lower Extremity (Arterial Duplex Low) - Signed 

## 2010-09-06 NOTE — Assessment & Plan Note (Signed)
Summary: 4 month rov/njr   Vital Signs:  Patient profile:   77 year old female Weight:      219 pounds BMI:     41.87 Temp:     98.5 degrees F oral Pulse rate:   76 / minute Pulse rhythm:   regular Resp:     12 per minute BP sitting:   140 / 72  (left arm) Cuff size:   large  Vitals Entered By: Gladis Riffle, RN (March 13, 2010 10:10 AM) CC: 4 month rov-- Is Patient Diabetic? No   Primary Care Elver Stadler:  Birdie Sons, MD  CC:  4 month rov--.  History of Present Illness:  Follow-Up Visit      This is a 77 year old woman who presents for Follow-up visit.  The patient denies chest pain and palpitations.  Since the last visit the patient notes no new problems or concerns.  The patient reports taking meds as prescribed.  When questioned about possible medication side effects, the patient notes none.  she has been able to lose some weight. trying to do some exercise  All other systems reviewed and were negative   Preventive Screening-Counseling & Management  Alcohol-Tobacco     Smoking Status: never  Current Problems (verified): 1)  Anemia-unspecified  (ICD-285.9) 2)  Osteoarthritis, Knee, Right  (ICD-715.96) 3)  Obesity  (ICD-278.00) 4)  Hyperglycemia  (ICD-790.29) 5)  Renal Insufficiency  (ICD-588.9) 6)  Osteopenia  (ICD-733.90) 7)  Hypertension  (ICD-401.9) 8)  Hyperlipidemia  (ICD-272.4) 9)  Gerd  (ICD-530.81) 10)  Depression  (ICD-311)  Current Medications (verified): 1)  Citalopram Hydrobromide 20 Mg Tabs (Citalopram Hydrobromide) .... Once Daily 2)  Lisinopril 40 Mg Tabs (Lisinopril) .... Take 1 Tablet By Mouth Once A Day 3)  Lorazepam 0.5 Mg Tabs (Lorazepam) .... One By Mouth Daily As Needed 4)  Omeprazole 40 Mg Cpdr (Omeprazole) .... One Tablet By Mouth Once Daily 5)  Furosemide 20 Mg Tabs (Furosemide) .... Once Daily 6)  Potassium Chloride Cr 10 Meq Cr-Tabs (Potassium Chloride) .... Take Two Daily 7)  Simvastatin 40 Mg  Tabs (Simvastatin) .Marland Kitchen.. 1qd 8)   Caltrate 600+d 600-400 Mg-Unit  Tabs (Calcium Carbonate-Vitamin D) .... Two Times A Day 9)  Mylanta 200-200-20 Mg/31ml  Susp (Alum & Mag Hydroxide-Simeth) .... One Tsp Every Bedtime 10)  Multivitamins  Caps (Multiple Vitamin) .Marland Kitchen.. 1 Once Daily 11)  Tylenol Arthritis Pain 650 Mg Cr-Tabs (Acetaminophen) .... As Needed 12)  Terazol 3 0.8 % Crea (Terconazole) .... Apply As Needed Yeast Infection 13)  Labetalol Hcl 100 Mg Tabs (Labetalol Hcl) .... Take 1 Tablet By Mouth At Bedtime 14)  Aspirin 81 Mg Tabs (Aspirin) .... Once Daily 15)  Oxycodone-Acetaminophen 5-325 Mg Tabs (Oxycodone-Acetaminophen) .... One By Mouth Every Six Hours As Needed Pain 16)  Vitamin B-12 250 Mcg Tabs (Cyanocobalamin) .... Once Daily  Allergies: 1)  ! Diltiazem Hcl Cr (Diltiazem Hcl)  Past History:  Past Medical History: Last updated: 07/03/2009 Depression GERD Hyperlipidemia Hypertension Osteopenia Renal insufficiency  creat 1.3  2007 Internal hemorrhoids Hiatal hernia LBP positive stress cardiolyte, mild  Past Surgical History: Last updated: 06/30/2009 Appendectomy  1962 Cholecystectomy Oophorectomy, ? cause  1962 Parathyroidectomy, ? cause--? bones too thin, ?elevated PTH D & C 1962 wrist fx 10/01 uterine polyps"tail bone surgery" EGD with dilatation per Dr. Russella Dar T & A  Family History: Last updated: 26-Jun-2009 Father-deceased with renal failure--80 Mother deceased pneumonia, CHF --56yo Family History of Colon Cancer: oldest sister died 49  Social History: Last updated: 06/06/2009 Single Never Smoked Alcohol use-no Regular exercise-no Illicit Drug Use - no  Risk Factors: Exercise: no (03/27/2007)  Risk Factors: Smoking Status: never (03/13/2010)  Physical Exam  General:  alert and well-developed.   Head:  normocephalic and atraumatic.   Eyes:  pupils equal and pupils round.   Ears:  R ear normal and L ear normal.   Neck:  no masses, thyromegaly, or abnormal cervical nodes.     Lungs:  normal respiratory effort and no intercostal retractions.   Heart:  normal rate and regular rhythm.   Abdomen:  soft and non-tender.   Msk:  No deformity or scoliosis noted of thoracic or lumbar spine.   Extremities:  large ankles but only trace pitting edema Neurologic:  cranial nerves II-XII intact and gait normal.     Impression & Recommendations:  Problem # 1:  RENAL INSUFFICIENCY (ICD-588.9) will need regular f/u  Problem # 2:  HYPERTENSION (ICD-401.9) reasonable control given weight Her updated medication list for this problem includes:    Lisinopril 40 Mg Tabs (Lisinopril) .Marland Kitchen... Take 1 tablet by mouth once a day    Furosemide 20 Mg Tabs (Furosemide) ..... Once daily    Labetalol Hcl 100 Mg Tabs (Labetalol hcl) .Marland Kitchen... Take 1 tablet by mouth at bedtime  BP today: 140/72 Prior BP: 108/64 (02/23/2010)  Labs Reviewed: K+: 4.5 (11/07/2009) Creat: : 1.5 (11/07/2009)   Chol: 133 (11/07/2009)   HDL: 46.20 (11/07/2009)   LDL: 56 (11/07/2009)   TG: 156.0 (11/07/2009)  Problem # 3:  HYPERLIPIDEMIA (ICD-272.4) previously controlled .cm  Her updated medication list for this problem includes:    Simvastatin 40 Mg Tabs (Simvastatin) .Marland Kitchen... 1qd  Labs Reviewed: SGOT: 26 (11/07/2009)   SGPT: 22 (11/07/2009)   HDL:46.20 (11/07/2009), 49.10 (04/12/2009)  LDL:56 (11/07/2009), 80 (04/12/2009)  Chol:133 (11/07/2009), 151 (04/12/2009)  Trig:156.0 (11/07/2009), 111.0 (04/12/2009)  Problem # 4:  DEPRESSION (ICD-311) sxs are well controlled continue current medications  Her updated medication list for this problem includes:    Citalopram Hydrobromide 20 Mg Tabs (Citalopram hydrobromide) ..... Once daily    Lorazepam 0.5 Mg Tabs (Lorazepam) ..... One by mouth daily as needed  Complete Medication List: 1)  Citalopram Hydrobromide 20 Mg Tabs (Citalopram hydrobromide) .... Once daily 2)  Lisinopril 40 Mg Tabs (Lisinopril) .... Take 1 tablet by mouth once a day 3)  Lorazepam 0.5 Mg Tabs  (Lorazepam) .... One by mouth daily as needed 4)  Omeprazole 40 Mg Cpdr (Omeprazole) .... One tablet by mouth once daily 5)  Furosemide 20 Mg Tabs (Furosemide) .... Once daily 6)  Potassium Chloride Cr 10 Meq Cr-tabs (Potassium chloride) .... Take two daily 7)  Simvastatin 40 Mg Tabs (Simvastatin) .Marland Kitchen.. 1qd 8)  Caltrate 600+d 600-400 Mg-unit Tabs (Calcium carbonate-vitamin d) .... Two times a day 9)  Mylanta 200-200-20 Mg/7ml Susp (Alum & mag hydroxide-simeth) .... One tsp every bedtime 10)  Multivitamins Caps (Multiple vitamin) .Marland Kitchen.. 1 once daily 11)  Tylenol Arthritis Pain 650 Mg Cr-tabs (Acetaminophen) .... As needed 12)  Terazol 3 0.8 % Crea (Terconazole) .... Apply as needed yeast infection 13)  Labetalol Hcl 100 Mg Tabs (Labetalol hcl) .... Take 1 tablet by mouth at bedtime 14)  Aspirin 81 Mg Tabs (Aspirin) .... Once daily 15)  Oxycodone-acetaminophen 5-325 Mg Tabs (Oxycodone-acetaminophen) .... One by mouth every six hours as needed pain 16)  Vitamin B-12 5000 Mcg Subl (Cyanocobalamin) .... Once daily  Patient Instructions: 1)  see me 6 weeks

## 2010-09-06 NOTE — Progress Notes (Signed)
Summary: labetalol  Phone Note Call from Patient Call back at [facility] Phone [phone]   Summary of Call: The labetalol 200mg  makes me shake so bad, like the DTs, continuously.  Thinks 200mg  is too much for her.  They are grooved and she would like to cut them in half.   Initial call taken by: Rudy Jew, RN,  October 27, 2009 2:46 PM  Follow-up for Phone Call        ok Follow-up by: Birdie Sons MD,  October 27, 2009 4:39 PM  Additional Follow-up for Phone Call Additional follow up Details #1::        Phone Call Completed Additional Follow-up by: Rudy Jew, RN,  October 30, 2009 8:09 AM    New/Updated Medications: LABETALOL HCL 200 MG TABS (LABETALOL HCL) one by mouth bid

## 2010-09-06 NOTE — Miscellaneous (Signed)
Summary: Initial Summary for PT Services/[facility]  Initial Summary for PT Services/[facility]   Imported By: Maryln Gottron 03/07/2010 12:11:24  _____________________________________________________________________  External Attachment:    Type:   Image     Comment:   External Document

## 2010-09-06 NOTE — Progress Notes (Signed)
Summary: Lorazepam needs to be called in to [facility]  Phone Note Call from Patient Call back at Home Phone [phone]   Caller: Patient Summary of Call: Pt called and said med has been called in to wrong pharmacy. Script should have been called in to [facility] [phone]. Pt no longer uses [facility]. Pls remove.  Pt deserately needs med called in to correct pharmacy today.  Initial call taken by: Lucy Antigua,  July 13, 2010 9:18 AM  Follow-up for Phone Call        rx was faxed today Follow-up by: Alfred Levins, CMA,  July 13, 2010 9:42 AM

## 2010-09-06 NOTE — Assessment & Plan Note (Signed)
Summary: 4 MONTH ROV/NJR/PT RESCD//CCM   Vital Signs:  Patient profile:   77 year old female Weight:      224 pounds Temp:     97.9 degrees F oral Pulse rate:   80 / minute Pulse rhythm:   regular Resp:     20 per minute BP sitting:   142 / 72  (left arm) Cuff size:   large  Vitals Entered By: Gladis Riffle, RN (November 07, 2009 11:01 AM) CC: 4 month rov Is Patient Diabetic? No   Primary Care Provider:  Birdie Sons, MD  CC:  4 month rov.  History of Present Illness:  Follow-Up Visit      This is a 77 year old woman who presents for Follow-up visit.  The patient denies chest pain and palpitations.  Since the last visit the patient notes no new problems or concerns.  The patient reports taking meds as prescribed.  When questioned about possible medication side effects, the patient notes none.    All other systems reviewed and were negative   Preventive Screening-Counseling & Management  Alcohol-Tobacco     Smoking Status: never  Current Problems (verified): 1)  Anemia-unspecified  (ICD-285.9) 2)  Osteoarthritis, Knee, Right  (ICD-715.96) 3)  Obesity  (ICD-278.00) 4)  Hyperglycemia  (ICD-790.29) 5)  Renal Insufficiency  (ICD-588.9) 6)  Osteopenia  (ICD-733.90) 7)  Hypertension  (ICD-401.9) 8)  Hyperlipidemia  (ICD-272.4) 9)  Gerd  (ICD-530.81) 10)  Depression  (ICD-311)  Current Medications (verified): 1)  Citalopram Hydrobromide 20 Mg Tabs (Citalopram Hydrobromide) .... Once Daily 2)  Lisinopril 40 Mg Tabs (Lisinopril) .... Take 1 Tablet By Mouth Once A Day 3)  Lorazepam 0.5 Mg Tabs (Lorazepam) .... One By Mouth Daily As Needed 4)  Omeprazole 40 Mg Cpdr (Omeprazole) .... One Tablet By Mouth Once Daily 5)  Furosemide 20 Mg Tabs (Furosemide) .... Once Daily 6)  Klor-Con M20 20 Meq  Tbcr (Potassium Chloride Crys Cr) .... Once Daily 7)  Simvastatin 40 Mg  Tabs (Simvastatin) .Marland Kitchen.. 1qd 8)  Caltrate 600+d 600-400 Mg-Unit  Tabs (Calcium Carbonate-Vitamin D) .... Two Times A  Day 9)  Mylanta 200-200-20 Mg/76ml  Susp (Alum & Mag Hydroxide-Simeth) .... One Tsp Every Bedtime 10)  Sanctura Xr 60 Mg Xr24h-Cap (Trospium Chloride) .Marland Kitchen.. 1 Once Daily--Wait 30 Minutes Before Eating 11)  Multivitamins  Caps (Multiple Vitamin) .Marland Kitchen.. 1 Once Daily 12)  Tylenol Arthritis Pain 650 Mg Cr-Tabs (Acetaminophen) .... As Needed 13)  Terazol 3 0.8 % Crea (Terconazole) .... Apply As Needed Yeast Infection 14)  Labetalol Hcl 200 Mg Tabs (Labetalol Hcl) .... 1/2 By Mouth Two Times A Day  Allergies: 1)  ! Diltiazem Hcl Cr (Diltiazem Hcl)  Past History:  Past Medical History: Last updated: 07/03/2009 Depression GERD Hyperlipidemia Hypertension Osteopenia Renal insufficiency  creat 1.3  2007 Internal hemorrhoids Hiatal hernia LBP positive stress cardiolyte, mild  Past Surgical History: Last updated: 06/30/2009 Appendectomy  1962 Cholecystectomy Oophorectomy, ? cause  1962 Parathyroidectomy, ? cause--? bones too thin, ?elevated PTH D & C 1962 wrist fx 10/01 uterine polyps"tail bone surgery" EGD with dilatation per Dr. Russella Dar T & A  Family History: Last updated: 2009/07/03 Father-deceased with renal failure--80 Mother deceased pneumonia, CHF --54yo Family History of Colon Cancer: oldest sister died 26  Social History: Last updated: 2009/07/03 Single Never Smoked Alcohol use-no Regular exercise-no Illicit Drug Use - no  Risk Factors: Exercise: no (03/27/2007)  Risk Factors: Smoking Status: never (11/07/2009)  Physical Exam  General:  alert  pleasant obese 77 year old female Head:  normocephalic and atraumatic.   Eyes:  pupils equal and pupils round.   Ears:  R ear normal and L ear normal.   Neck:  no masses, thyromegaly, or abnormal cervical nodes.   FROM neck Lungs:  normal respiratory effort and no accessory muscle use.   Heart:  normal rate and regular rhythm.   Abdomen:  normal bowel sounds. Soft. Mild suprapubic tenderness. Msk:  No deformity or  scoliosis noted of thoracic or lumbar spine.   Neurologic:  cranial nerves II-XII intact and gait normal.     Impression & Recommendations:  Problem # 1:  OBESITY (ICD-278.00) she has lost some weight---encouraged to continue weight loss  exercise is key  Problem # 2:  HYPERGLYCEMIA (ICD-790.29) will improve with weight loss  Problem # 3:  HYPERLIPIDEMIA (ICD-272.4)  previously controlled check labs today Her updated medication list for this problem includes:    Simvastatin 40 Mg Tabs (Simvastatin) .Marland Kitchen... 1qd  Labs Reviewed: SGOT: 18 (06/06/2009)   SGPT: 18 (06/06/2009)   HDL:49.10 (04/12/2009), 39.5 (08/24/2008)  LDL:80 (04/12/2009), 66 (08/24/2008)  Chol:151 (04/12/2009), 136 (08/24/2008)  Trig:111.0 (04/12/2009), 155 (08/24/2008)  Orders: TLB-Lipid Panel (80061-LIPID) TLB-Hepatic/Liver Function Pnl (80076-HEPATIC)  Problem # 4:  GERD (ICD-530.81) controlled continue current medications  Her updated medication list for this problem includes:    Omeprazole 40 Mg Cpdr (Omeprazole) ..... One tablet by mouth once daily    Mylanta 200-200-20 Mg/42ml Susp (Alum & mag hydroxide-simeth) ..... One tsp every bedtime  Problem # 5:  HEADACHE (ICD-784.0)  she reports long term , intermittent headache no neurologic sxs no visual sxs duration 1-2 years Her updated medication list for this problem includes:    Tylenol Arthritis Pain 650 Mg Cr-tabs (Acetaminophen) .Marland Kitchen... As needed    Labetalol Hcl 200 Mg Tabs (Labetalol hcl) .Marland Kitchen... 1/2 by mouth two times a day  Orders: Sedimentation Rate, non-automated (47829)  Complete Medication List: 1)  Citalopram Hydrobromide 20 Mg Tabs (Citalopram hydrobromide) .... Once daily 2)  Lisinopril 40 Mg Tabs (Lisinopril) .... Take 1 tablet by mouth once a day 3)  Lorazepam 0.5 Mg Tabs (Lorazepam) .... One by mouth daily as needed 4)  Omeprazole 40 Mg Cpdr (Omeprazole) .... One tablet by mouth once daily 5)  Furosemide 20 Mg Tabs (Furosemide) ....  Once daily 6)  Klor-con M20 20 Meq Tbcr (Potassium chloride crys cr) .... Once daily 7)  Simvastatin 40 Mg Tabs (Simvastatin) .Marland Kitchen.. 1qd 8)  Caltrate 600+d 600-400 Mg-unit Tabs (Calcium carbonate-vitamin d) .... Two times a day 9)  Mylanta 200-200-20 Mg/85ml Susp (Alum & mag hydroxide-simeth) .... One tsp every bedtime 10)  Sanctura Xr 60 Mg Xr24h-cap (Trospium chloride) .Marland Kitchen.. 1 once daily--wait 30 minutes before eating 11)  Multivitamins Caps (Multiple vitamin) .Marland Kitchen.. 1 once daily 12)  Tylenol Arthritis Pain 650 Mg Cr-tabs (Acetaminophen) .... As needed 13)  Terazol 3 0.8 % Crea (Terconazole) .... Apply as needed yeast infection 14)  Labetalol Hcl 200 Mg Tabs (Labetalol hcl) .... 1/2 by mouth two times a day  Other Orders: TLB-CBC Platelet - w/Differential (85025-CBCD) TLB-BMP (Basic Metabolic Panel-BMET) (80048-METABOL)  Patient Instructions: 1)  Please schedule a follow-up appointment in 4 months. 2)  It is important that you exercise regularly at least 20 minutes 5 times a week. If you develop chest pain, have severe difficulty breathing, or feel very tired , stop exercising immediately and seek medical attention. 3)  You need to lose weight. Consider a lower calorie  diet and regular exercise.

## 2010-09-06 NOTE — Assessment & Plan Note (Signed)
Summary: ingrown toenail/dm   Vital Signs:  Patient profile:   77 year old female Weight:      225 pounds Temp:     98.2 degrees F oral Pulse rate:   76 / minute Pulse rhythm:   regular Resp:     14 per minute BP sitting:   126 / 62  (left arm) Cuff size:   large  Vitals Entered By: Gladis Riffle, RN (January 04, 2010 11:33 AM) CC: c/o ingrown toenail left great toe x 2 months, getting worse Is Patient Diabetic? No Comments bilateral ankle swelling noted, did not take diuretic yesterday or today   Primary Care Provider:  Birdie Sons, MD  CC:  c/o ingrown toenail left great toe x 2 months and getting worse.  History of Present Illness:  Follow-Up Visit      This is a 77 year old woman who presents for Follow-up visit.  The patient denies chest pain and palpitations.  Since the last visit the patient notes no new problems or concerns except that she c/o ingrown nail for years 1st and 2nd toe---left....minimimal pain at times.  The patient reports taking meds as prescribed.  When questioned about possible medication side effects, the patient notes none.    home BPs: 110-151/62 she has not taken diuretic today or yesterday and has noted some lower extreemity sweelling  Preventive Screening-Counseling & Management  Alcohol-Tobacco     Smoking Status: never  Current Medications (verified): 1)  Citalopram Hydrobromide 20 Mg Tabs (Citalopram Hydrobromide) .... Once Daily 2)  Lisinopril 40 Mg Tabs (Lisinopril) .... Take 1 Tablet By Mouth Once A Day 3)  Lorazepam 0.5 Mg Tabs (Lorazepam) .... One By Mouth Daily As Needed 4)  Omeprazole 40 Mg Cpdr (Omeprazole) .... One Tablet By Mouth Once Daily 5)  Furosemide 20 Mg Tabs (Furosemide) .... Once Daily 6)  Klor-Con M20 20 Meq  Tbcr (Potassium Chloride Crys Cr) .... Once Daily 7)  Simvastatin 40 Mg  Tabs (Simvastatin) .Marland Kitchen.. 1qd 8)  Caltrate 600+d 600-400 Mg-Unit  Tabs (Calcium Carbonate-Vitamin D) .... Two Times A Day 9)  Mylanta 200-200-20  Mg/45ml  Susp (Alum & Mag Hydroxide-Simeth) .... One Tsp Every Bedtime 10)  Multivitamins  Caps (Multiple Vitamin) .Marland Kitchen.. 1 Once Daily 11)  Tylenol Arthritis Pain 650 Mg Cr-Tabs (Acetaminophen) .... As Needed 12)  Terazol 3 0.8 % Crea (Terconazole) .... Apply As Needed Yeast Infection 13)  Labetalol Hcl 200 Mg Tabs (Labetalol Hcl) .... 1/2 By Mouth At Bedtime 14)  Aspirin 81 Mg Tabs (Aspirin) .... Once Daily  Allergies: 1)  ! Diltiazem Hcl Cr (Diltiazem Hcl)  Past History:  Past Medical History: Last updated: 07/03/2009 Depression GERD Hyperlipidemia Hypertension Osteopenia Renal insufficiency  creat 1.3  2007 Internal hemorrhoids Hiatal hernia LBP positive stress cardiolyte, mild  Past Surgical History: Last updated: 06/30/2009 Appendectomy  1962 Cholecystectomy Oophorectomy, ? cause  1962 Parathyroidectomy, ? cause--? bones too thin, ?elevated PTH D & C 1962 wrist fx 10/01 uterine polyps"tail bone surgery" EGD with dilatation per Dr. Russella Dar T & A  Family History: Last updated: Jul 01, 2009 Father-deceased with renal failure--80 Mother deceased pneumonia, CHF --41yo Family History of Colon Cancer: oldest sister died 59  Social History: Last updated: 2009-07-01 Single Never Smoked Alcohol use-no Regular exercise-no Illicit Drug Use - no  Risk Factors: Exercise: no (03/27/2007)  Risk Factors: Smoking Status: never (01/04/2010)  Physical Exam  General:  alert and well-developed.   Head:  normocephalic and atraumatic.   Eyes:  pupils  equal and pupils round.   Ears:  R ear normal and L ear normal.   Neck:  no masses, thyromegaly, or abnormal cervical nodes.   FROM neck Chest Wall:  no deformities and no tenderness.   Lungs:  normal respiratory effort and no intercostal retractions.   Abdomen:  normal bowel sounds. Soft. Mild suprapubic tenderness. Msk:  No deformity or scoliosis noted of thoracic or lumbar spine.   Extremities:  no significant ingrown  nail Neurologic:  cranial nerves II-XII intact and gait normal.   Skin:  left great toe is somewhat misshapened not signigicantly ingrown no erythema   Impression & Recommendations:  Problem # 1:  OBESITY (ICD-278.00) this is her most signigficat problem---advised diet/exercise/weight loss  Problem # 2:  HYPERGLYCEMIA (ICD-790.29) likely related to weight discussed need to lose weight  Problem # 3:  HYPERLIPIDEMIA (ICD-272.4) controlled continue current medications  Her updated medication list for this problem includes:    Simvastatin 40 Mg Tabs (Simvastatin) .Marland Kitchen... 1qd  Labs Reviewed: SGOT: 26 (11/07/2009)   SGPT: 22 (11/07/2009)   HDL:46.20 (11/07/2009), 49.10 (04/12/2009)  LDL:56 (11/07/2009), 80 (04/12/2009)  Chol:133 (11/07/2009), 151 (04/12/2009)  Trig:156.0 (11/07/2009), 111.0 (04/12/2009) painful nail---no treatmnet necessary suspect would improvewith weight loss  Complete Medication List: 1)  Citalopram Hydrobromide 20 Mg Tabs (Citalopram hydrobromide) .... Once daily 2)  Lisinopril 40 Mg Tabs (Lisinopril) .... Take 1 tablet by mouth once a day 3)  Lorazepam 0.5 Mg Tabs (Lorazepam) .... One by mouth daily as needed 4)  Omeprazole 40 Mg Cpdr (Omeprazole) .... One tablet by mouth once daily 5)  Furosemide 20 Mg Tabs (Furosemide) .... Once daily 6)  Klor-con M20 20 Meq Tbcr (Potassium chloride crys cr) .... Once daily 7)  Simvastatin 40 Mg Tabs (Simvastatin) .Marland Kitchen.. 1qd 8)  Caltrate 600+d 600-400 Mg-unit Tabs (Calcium carbonate-vitamin d) .... Two times a day 9)  Mylanta 200-200-20 Mg/66ml Susp (Alum & mag hydroxide-simeth) .... One tsp every bedtime 10)  Multivitamins Caps (Multiple vitamin) .Marland Kitchen.. 1 once daily 11)  Tylenol Arthritis Pain 650 Mg Cr-tabs (Acetaminophen) .... As needed 12)  Terazol 3 0.8 % Crea (Terconazole) .... Apply as needed yeast infection 13)  Labetalol Hcl 200 Mg Tabs (Labetalol hcl) .... 1/2 by mouth at bedtime 14)  Aspirin 81 Mg Tabs (Aspirin) ....  Once daily  Patient Instructions: 1)  keep appt as scheduled  Prevention & Chronic Care Immunizations   Influenza vaccine: Historical  (05/03/2009)    Tetanus booster: Not documented    Pneumococcal vaccine: given  (05/05/2004)   Pneumococcal vaccine due: 05/2014    H. zoster vaccine: Not documented  Colorectal Screening   Hemoccult: Not documented    Colonoscopy: done  (06/12/2005)   Colonoscopy due: 06/2010  Other Screening   Pap smear: Not documented    Mammogram: ASSESSMENT: Negative - BI-RADS 1^MM DIGITAL SCREENING  (10/19/2009)    DXA bone density scan: done  (05/05/2000)   Smoking status: never  (01/04/2010)  Lipids   Total Cholesterol: 133  (11/07/2009)   LDL: 56  (11/07/2009)   LDL Direct: 112.5  (02/13/2007)   HDL: 46.20  (11/07/2009)   Triglycerides: 156.0  (11/07/2009)    SGOT (AST): 26  (11/07/2009)   SGPT (ALT): 22  (11/07/2009)   Alkaline phosphatase: 58  (11/07/2009)   Total bilirubin: 0.6  (11/07/2009)  Hypertension   Last Blood Pressure: 126 / 62  (01/04/2010)   Serum creatinine: 1.5  (11/07/2009)   Serum potassium 4.5  (11/07/2009)  Self-Management Support :  Hypertension self-management support: Not documented    Lipid self-management support: Not documented

## 2010-09-06 NOTE — Assessment & Plan Note (Signed)
Summary: 6 week fup//ccm [facility] BMP/NJR   Vital Signs:  Patient profile:   77 year old female Weight:      216 pounds Temp:     98.4 degrees F oral Pulse rate:   78 / minute Pulse rhythm:   regular Resp:     24 per minute BP sitting:   126 / 78  Vitals Entered By: Lynann Beaver CMA (April 30, 2010 11:28 AM) CC: rov Is Patient Diabetic? No Pain Assessment Patient in pain? no        Primary Care Jennilyn Esteve:  Birdie Sons, MD  CC:  rov.  History of Present Illness:  Follow-Up Visit      This is a 77 year old woman who presents for Follow-up visit.  The patient denies chest pain and palpitations.  Since the last visit the patient notes no new problems or concerns--has seen ortho for back and knee injections with great success.  The patient reports taking meds as prescribed.  When questioned about possible medication side effects, the patient notes none.  She has tried to lose weight.  chronic fatigue, chronic aches and pains otherwise All other systems reviewed and were negative   Current Problems (verified): 1)  Anemia-unspecified  (ICD-285.9) 2)  Osteoarthritis, Knee, Right  (ICD-715.96) 3)  Obesity  (ICD-278.00) 4)  Hyperglycemia  (ICD-790.29) 5)  Renal Insufficiency  (ICD-588.9) 6)  Osteopenia  (ICD-733.90) 7)  Hypertension  (ICD-401.9) 8)  Hyperlipidemia  (ICD-272.4) 9)  Gerd  (ICD-530.81) 10)  Depression  (ICD-311)  Current Medications (verified): 1)  Citalopram Hydrobromide 20 Mg Tabs (Citalopram Hydrobromide) .... Once Daily 2)  Lisinopril 40 Mg Tabs (Lisinopril) .... Take 1 Tablet By Mouth Once A Day 3)  Lorazepam 0.5 Mg Tabs (Lorazepam) .... One By Mouth Daily As Needed 4)  Omeprazole 40 Mg Cpdr (Omeprazole) .... One Tablet By Mouth Once Daily 5)  Furosemide 20 Mg Tabs (Furosemide) .... Once Daily 6)  Potassium Chloride Cr 10 Meq Cr-Tabs (Potassium Chloride) .... Take Two Daily 7)  Simvastatin 40 Mg  Tabs (Simvastatin) .Marland Kitchen.. 1qd 8)  Caltrate 600+d 600-400  Mg-Unit  Tabs (Calcium Carbonate-Vitamin D) .... Two Times A Day 9)  Mylanta 200-200-20 Mg/65ml  Susp (Alum & Mag Hydroxide-Simeth) .... One Tsp Every Bedtime 10)  Multivitamins  Caps (Multiple Vitamin) .Marland Kitchen.. 1 Once Daily 11)  Tylenol Arthritis Pain 650 Mg Cr-Tabs (Acetaminophen) .... As Needed 12)  Terazol 3 0.8 % Crea (Terconazole) .... Apply As Needed Yeast Infection 13)  Labetalol Hcl 100 Mg Tabs (Labetalol Hcl) .... Take 1 Tablet By Mouth At Bedtime 14)  Aspirin 81 Mg Tabs (Aspirin) .... Once Daily 15)  Oxycodone-Acetaminophen 5-325 Mg Tabs (Oxycodone-Acetaminophen) .... One By Mouth Every Six Hours As Needed Pain 16)  Vitamin B-12 5000 Mcg Subl (Cyanocobalamin) .... Once Daily 17)  Tramadol Hcl 50 Mg Tabs (Tramadol Hcl) .Marland Kitchen.. 1 Tab By Mouth Two Times A Day As Needed Pain  Allergies (verified): 1)  ! Diltiazem Hcl Cr (Diltiazem Hcl)  Past History:  Past Medical History: Last updated: 07/03/2009 Depression GERD Hyperlipidemia Hypertension Osteopenia Renal insufficiency  creat 1.3  2007 Internal hemorrhoids Hiatal hernia LBP positive stress cardiolyte, mild  Past Surgical History: Last updated: 06/30/2009 Appendectomy  1962 Cholecystectomy Oophorectomy, ? cause  1962 Parathyroidectomy, ? cause--? bones too thin, ?elevated PTH D & C 1962 wrist fx 10/01 uterine polyps"tail bone surgery" EGD with dilatation per Dr. Russella Dar [facility] & A  Family History: Last updated: June 28, 2009 Father-deceased with renal failure--80 Mother deceased pneumonia,  CHF --78yo Family History of Colon Cancer: oldest sister died 40  Social History: Last updated: 06/06/2009 Single Never Smoked Alcohol use-no Regular exercise-no Illicit Drug Use - no  Risk Factors: Exercise: no (03/23/2010)  Risk Factors: Smoking Status: never (03/23/2010)  Physical Exam  General:  alert and well-nourished.   Head:  normocephalic and atraumatic.   Eyes:  pupils equal and pupils round.   Ears:  R ear normal  and L ear normal.   Neck:  no masses, thyromegaly, or abnormal cervical nodes.   Lungs:  normal respiratory effort and no intercostal retractions.   Heart:  normal rate and regular rhythm.   Abdomen:  soft and non-tender.   Neurologic:  cranial nerves II-XII intact and gait normal.   Skin:  turgor normal and color normal.   Cervical Nodes:  no anterior cervical adenopathy and no posterior cervical adenopathy.   Psych:  good eye contact and not anxious appearing.      Impression & Recommendations:  Problem # 1:  ANEMIA-UNSPECIFIED (ICD-285.9)  improved will need to follow Her updated medication list for this problem includes:    Vitamin B-12 5000 Mcg Subl (Cyanocobalamin) ..... Once daily  Hgb: 12.1 (11/07/2009)   Hct: 35.1 (11/07/2009)   Platelets: 192.0 (11/07/2009) RBC: 3.84 (11/07/2009)   RDW: 13.0 (11/07/2009)   WBC: 7.2 (11/07/2009) MCV: 91.4 (11/07/2009)   MCHC: 34.4 (11/07/2009) TSH: 1.94 (04/12/2009)  Orders: TLB-CBC Platelet - w/Differential (85025-CBCD)  Problem # 2:  RENAL INSUFFICIENCY (ICD-588.9) needs f/u  Problem # 3:  HYPERLIPIDEMIA (ICD-272.4)  previously controlled continue current medications  Her updated medication list for this problem includes:    Simvastatin 40 Mg Tabs (Simvastatin) .Marland Kitchen... 1qd  Labs Reviewed: SGOT: 26 (11/07/2009)   SGPT: 22 (11/07/2009)   HDL:46.20 (11/07/2009), 49.10 (04/12/2009)  LDL:56 (11/07/2009), 80 (04/12/2009)  Chol:133 (11/07/2009), 151 (04/12/2009)  Trig:156.0 (11/07/2009), 111.0 (04/12/2009)  Orders: Specimen Handling ([address]) TLB-Lipid Panel (80061-LIPID) TLB-Hepatic/Liver Function Pnl (80076-HEPATIC) TLB-TSH (Thyroid Stimulating Hormone) (84443-TSH)  Problem # 4:  DEPRESSION (ICD-311) controlled continue current medications  Her updated medication list for this problem includes:    Citalopram Hydrobromide 20 Mg Tabs (Citalopram hydrobromide) ..... Once daily    Lorazepam 0.5 Mg Tabs (Lorazepam) ..... One by  mouth daily as needed  Problem # 5:  OSTEOARTHRITIS, KNEE, RIGHT (ICD-715.96) seeing dr. Cleophas Dunker has had cortisone injection Her updated medication list for this problem includes:    Tylenol Arthritis Pain 650 Mg Cr-tabs (Acetaminophen) .Marland Kitchen... As needed    Aspirin 81 Mg Tabs (Aspirin) ..... Once daily    Oxycodone-acetaminophen 5-325 Mg Tabs (Oxycodone-acetaminophen) ..... One by mouth every six hours as needed pain    Tramadol Hcl 50 Mg Tabs (Tramadol hcl) .Marland Kitchen... 1 tab by mouth two times a day as needed pain  Complete Medication List: 1)  Citalopram Hydrobromide 20 Mg Tabs (Citalopram hydrobromide) .... Once daily 2)  Lisinopril 40 Mg Tabs (Lisinopril) .... Take 1 tablet by mouth once a day 3)  Lorazepam 0.5 Mg Tabs (Lorazepam) .... One by mouth daily as needed 4)  Omeprazole 40 Mg Cpdr (Omeprazole) .... One tablet by mouth once daily 5)  Furosemide 20 Mg Tabs (Furosemide) .... Once daily 6)  Potassium Chloride Cr 10 Meq Cr-tabs (Potassium chloride) .... Take two daily 7)  Simvastatin 40 Mg Tabs (Simvastatin) .Marland Kitchen.. 1qd 8)  Caltrate 600+d 600-400 Mg-unit Tabs (Calcium carbonate-vitamin d) .... Two times a day 9)  Mylanta 200-200-20 Mg/28ml Susp (Alum & mag hydroxide-simeth) .... One tsp every bedtime 10)  Multivitamins Caps (Multiple vitamin) .Marland Kitchen.. 1 once daily 11)  Tylenol Arthritis Pain 650 Mg Cr-tabs (Acetaminophen) .... As needed 12)  Terazol 3 0.8 % Crea (Terconazole) .... Apply as needed yeast infection 13)  Labetalol Hcl 100 Mg Tabs (Labetalol hcl) .... Take 1 tablet by mouth at bedtime 14)  Aspirin 81 Mg Tabs (Aspirin) .... Once daily 15)  Oxycodone-acetaminophen 5-325 Mg Tabs (Oxycodone-acetaminophen) .... One by mouth every six hours as needed pain 16)  Vitamin B-12 5000 Mcg Subl (Cyanocobalamin) .... Once daily 17)  Tramadol Hcl 50 Mg Tabs (Tramadol hcl) .Marland Kitchen.. 1 tab by mouth two times a day as needed pain  Other Orders: Venipuncture (68341) Flu Vaccine 71yrs + MEDICARE PATIENTS  (D6222) Administration Flu vaccine - MCR (G0008) Zoster (Shingles) Vaccine Live (97989) Admin 1st Vaccine (21194) TLB-BMP (Basic Metabolic Panel-BMET) (80048-METABOL)  Patient Instructions: 1)  Please schedule a follow-up appointment in 3 months. 2)  Lose 10 pounds in the next 3 months Flu Vaccine Consent Questions     Do you have a history of severe allergic reactions to this vaccine? no    Any prior history of allergic reactions to egg and/or gelatin? no    Do you have a sensitivity to the preservative Thimersol? no    Do you have a past history of Guillan-Barre Syndrome? no    Do you currently have an acute febrile illness? no    Have you ever had a severe reaction to latex? no    Vaccine information given and explained to patient? yes    Are you currently pregnant? no    Lot Number:[id number]   Exp Date:02/02/2011   Site Given  Left Deltoid IM  .lbmedflu   Immunizations Administered:  Zostavax # 1:    Vaccine Type: Zostavax    Site: right deltoid    Mfr: Merck    Dose: 0.5 ml    Route:     Given by: Lynann Beaver CMA    Exp. Date: 03/09/2011    Lot #: [id number]

## 2010-09-14 ENCOUNTER — Encounter (INDEPENDENT_AMBULATORY_CARE_PROVIDER_SITE_OTHER): Payer: Self-pay

## 2010-09-18 ENCOUNTER — Encounter: Payer: Self-pay | Admitting: Gastroenterology

## 2010-09-26 ENCOUNTER — Other Ambulatory Visit: Payer: Self-pay | Admitting: Internal Medicine

## 2010-09-26 DIAGNOSIS — Z1231 Encounter for screening mammogram for malignant neoplasm of breast: Secondary | ICD-10-CM

## 2010-09-26 NOTE — Letter (Signed)
Summary: [facility] Instructions  [facility]  [address] [place], [place] [address]   Phone: [phone]  Fax: [phone]       Linda Bentley    06-30-1929    MRN: [id number]        Procedure Day /Date:  Tuesday 10/02/2010      Arrival Time: 10:30 am     Procedure Time: 11:30 am     Location of Procedure:                    _ x_   [facility] (4th Floor)                        PREPARATION FOR COLONOSCOPY WITH MOVIPREP   Starting 5 days prior to your procedure Thursday 2/23 do not eat nuts, seeds, popcorn, corn, beans, peas,  salads, or any raw vegetables.  Do not take any fiber supplements (e.g. Metamucil, Citrucel, and Benefiber).  THE DAY BEFORE YOUR PROCEDURE         DATE: Monday 2/27  1.  Drink clear liquids the entire day-NO SOLID FOOD  2.  Do not drink anything colored red or purple.  Avoid juices with pulp.  No orange juice.  3.  Drink at least 64 oz. (8 glasses) of fluid/clear liquids during the day to prevent dehydration and help the prep work efficiently.  CLEAR LIQUIDS INCLUDE: Water Jello Ice Popsicles Tea (sugar ok, no milk/cream) Powdered fruit flavored drinks Coffee (sugar ok, no milk/cream) Gatorade Juice: apple, white grape, white cranberry  Lemonade Clear bullion, consomm, broth Carbonated beverages (any kind) Strained chicken noodle soup Hard Candy                             4.  In the morning, mix first dose of MoviPrep solution:    Empty 1 Pouch A and 1 Pouch B into the disposable container    Add lukewarm drinking water to the top line of the container. Mix to dissolve    Refrigerate (mixed solution should be used within 24 hrs)  5.  Begin drinking the prep at 5:00 p.m. The MoviPrep container is divided by 4 marks.   Every 15 minutes drink the solution down to the next mark (approximately 8 oz) until the full liter is complete.   6.  Follow completed prep with 16 oz of clear liquid of your choice  (Nothing red or purple).  Continue to drink clear liquids until bedtime.  7.  Before going to bed, mix second dose of MoviPrep solution:    Empty 1 Pouch A and 1 Pouch B into the disposable container    Add lukewarm drinking water to the top line of the container. Mix to dissolve    Refrigerate  THE DAY OF YOUR PROCEDURE      DATE: Tuesday 2/28  Beginning at 6:30 a.m. (5 hours before procedure):         1. Every 15 minutes, drink the solution down to the next mark (approx 8 oz) until the full liter is complete.  2. Follow completed prep with 16 oz. of clear liquid of your choice.    3. You may drink clear liquids until 9:30 am (2 HOURS BEFORE PROCEDURE).   MEDICATION INSTRUCTIONS  Unless otherwise instructed, you should take regular prescription medications with a small sip of water   as early as possible the morning of  your procedure.  Additional medication instructions: Do not take Furosemide am of procedure         OTHER INSTRUCTIONS  You will need a responsible adult at least 75 years of age to accompany you and drive you home.   This person must remain in the waiting room during your procedure.  Wear loose fitting clothing that is easily removed.  Leave jewelry and other valuables at home.  However, you may wish to bring a book to read or  an iPod/MP3 player to listen to music as you wait for your procedure to start.  Remove all body piercing jewelry and leave at home.  Total time from sign-in until discharge is approximately 2-3 hours.  You should go home directly after your procedure and rest.  You can resume normal activities the  day after your procedure.  The day of your procedure you should not:   Drive   Make legal decisions   Operate machinery   Drink alcohol   Return to work  You will receive specific instructions about eating, activities and medications before you leave.    The above instructions have been reviewed and explained to me by   Ulis Rias RN  September 18, 2010 10:45 AM     I fully understand and can verbalize these instructions _____________________________ Date _________

## 2010-09-26 NOTE — Miscellaneous (Signed)
Summary: Lec previsit  Clinical Lists Changes  Medications: Added new medication of MOVIPREP 100 GM  SOLR (PEG-KCL-NACL-NASULF-NA ASC-C) As per prep instructions. - Signed Rx of MOVIPREP 100 GM  SOLR (PEG-KCL-NACL-NASULF-NA ASC-C) As per prep instructions.;  #1 x 0;  Signed;  Entered by: Ulis Rias RN;  Authorized by: Meryl Dare MD [facility];  Method used: Print then Give to Patient Observations: Added new observation of ALLERGY REV: Done (09/18/2010 10:15)    Prescriptions: MOVIPREP 100 GM  SOLR (PEG-KCL-NACL-NASULF-NA ASC-C) As per prep instructions.  #1 x 0   Entered by:   Ulis Rias RN   Authorized by:   Meryl Dare MD [facility]   Signed by:   Ulis Rias RN on 09/18/2010   Method used:   Print then Give to Patient   RxID:   [id number]

## 2010-10-02 ENCOUNTER — Other Ambulatory Visit (AMBULATORY_SURGERY_CENTER): Payer: Medicare Other | Admitting: Gastroenterology

## 2010-10-02 ENCOUNTER — Encounter: Payer: Self-pay | Admitting: Gastroenterology

## 2010-10-02 DIAGNOSIS — Z1211 Encounter for screening for malignant neoplasm of colon: Secondary | ICD-10-CM

## 2010-10-02 DIAGNOSIS — Z800 Family history of malignant neoplasm of digestive organs: Secondary | ICD-10-CM

## 2010-10-02 LAB — HM COLONOSCOPY

## 2010-10-11 ENCOUNTER — Other Ambulatory Visit: Payer: Self-pay | Admitting: *Deleted

## 2010-10-11 MED ORDER — LORAZEPAM 0.5 MG PO TABS: 0.5000 mg | ORAL_TABLET | Freq: Every day | ORAL | Status: DC | PRN

## 2010-10-11 NOTE — Procedures (Signed)
Summary: Colonoscopy  Patient: Linda Bentley Note: All result statuses are Final unless otherwise noted.  Tests: (1) Colonoscopy (COL)   COL Colonoscopy           DONE     [facility]     [address]     [place], [place]  [address]           COLONOSCOPY PROCEDURE REPORT           PATIENT:  Linda Bentley, Linda Bentley  MR#:  [id number]     BIRTHDATE:  May 01, 1929, 77 yrs. old  GENDER:  female     ENDOSCOPIST:  Judie Petit T. Russella Dar, MD, [facility]           PROCEDURE DATE:  10/02/2010     PROCEDURE:  Higher-risk screening colonoscopy G0105     ASA CLASS:  Class II     INDICATIONS:  1) Elevated Risk Screening  2) family history of     colon cancer: sister in her 20s.     MEDICATIONS:   Fentanyl 100 mcg IV, Versed 10 mg IV     DESCRIPTION OF PROCEDURE:   After the risks benefits and     alternatives of the procedure were thoroughly explained, informed     consent was obtained.  Digital rectal exam was performed and     revealed no abnormalities.   The LB [id number]-[id number] endoscope     was introduced through the anus and advanced to the cecum, which     was identified by both the appendix and ileocecal valve, without     limitations.  The quality of the prep was good, using MoviPrep.     The instrument was then slowly withdrawn as the colon was fully     examined.     3     <<PROCEDUREIMAGES>>           FINDINGS:  A normal appearing cecum, ileocecal valve, and     appendiceal orifice were identified. The ascending, hepatic     flexure, transverse, splenic flexure, descending, sigmoid colon,     and rectum appeared unremarkable.   Retroflexed views in the     rectum revealed no abnormalities.  The time to cecum =  5     minutes. The scope was then withdrawn (time =  11  min) from the     patient and the procedure completed.           COMPLICATIONS:  None           ENDOSCOPIC IMPRESSION:     1) Normal colon           RECOMMENDATIONS:     1) Given your age, you will not need another  colonoscopy for     colon cancer screening or polyp surveillance. These types of tests     usually stop around the age 11.           Linda Bentley. Russella Dar, MD, Linda Bentley           CC:  Linda Magnus, MD           n.     Linda DoctorVenita Bentley. Linda Bentley at 10/02/2010 12:25 PM           [name], [id number]  Note: An exclamation mark (!) indicates a result that was not dispersed into the flowsheet. Document Creation Date: 10/02/2010 12:26 PM _______________________________________________________________________  (1) Order result status: Final Collection or observation date-time: 10/02/2010 12:22  Requested date-time:  Receipt date-time:  Reported date-time:  Referring Physician:   Ordering Physician: Claudette Head [phone]) Specimen Source:  Source: [name] Order Number: [id number] Lab site:

## 2010-10-23 ENCOUNTER — Ambulatory Visit
Admission: RE | Admit: 2010-10-23 | Discharge: 2010-10-23 | Disposition: A | Discharge status: Home / Self Care | Payer: Medicare Other | Source: Ambulatory Visit | Attending: Internal Medicine | Admitting: Internal Medicine

## 2010-10-23 DIAGNOSIS — Z1231 Encounter for screening mammogram for malignant neoplasm of breast: Secondary | ICD-10-CM

## 2010-10-24 LAB — URINE CULTURE
Colony Count: NO GROWTH
Culture: NO GROWTH

## 2010-10-24 LAB — CBC
HCT: 36 % (ref 36.0–46.0)
MCHC: 33.5 g/dL (ref 30.0–36.0)
MCV: 93.6 fL (ref 78.0–100.0)
Platelets: 167 10*3/uL (ref 150–400)
RDW: 12.6 % (ref 11.5–15.5)

## 2010-10-24 LAB — POCT CARDIAC MARKERS
CKMB, poc: <1 ng/mL — ABNORMAL LOW (ref 1.0–8.0)
Troponin i, poc: <0.05 ng/mL (ref 0.00–0.09)
Troponin i, poc: <0.05 ng/mL (ref 0.00–0.09)

## 2010-10-24 LAB — CK TOTAL AND CKMB (NOT AT ARMC)
CK, MB: 1 ng/mL (ref 0.3–4.0)
CK, MB: 1 ng/mL (ref 0.3–4.0)
CK, MB: 1.2 ng/mL (ref 0.3–4.0)
Relative Index: 1.2 (ref 0.0–2.5)
Relative Index: INVALID (ref 0.0–2.5)
Total CK: 98 U/L (ref 7–177)

## 2010-10-24 LAB — URINALYSIS, ROUTINE W REFLEX MICROSCOPIC
Bilirubin Urine: NEGATIVE
Ketones, ur: NEGATIVE mg/dL
Nitrite: NEGATIVE
Urobilinogen, UA: 0.2 mg/dL (ref 0.0–1.0)
pH: 6.5 (ref 5.0–8.0)

## 2010-10-24 LAB — DIFFERENTIAL
Basophils Absolute: 0 10*3/uL (ref 0.0–0.1)
Lymphocytes Relative: 28 % (ref 12–46)
Monocytes Absolute: 0.5 10*3/uL (ref 0.1–1.0)
Monocytes Relative: 6 % (ref 3–12)
Neutro Abs: 5 10*3/uL (ref 1.7–7.7)
Neutrophils Relative %: 62 % (ref 43–77)

## 2010-10-24 LAB — COMPREHENSIVE METABOLIC PANEL
Albumin: 4.1 g/dL (ref 3.5–5.2)
BUN: 26 mg/dL — ABNORMAL HIGH (ref 6–23)
Creatinine, Ser: 1.44 mg/dL — ABNORMAL HIGH (ref 0.4–1.2)
Glucose, Bld: 110 mg/dL — ABNORMAL HIGH (ref 70–99)
Total Bilirubin: 0.6 mg/dL (ref 0.3–1.2)
Total Protein: 6.9 g/dL (ref 6.0–8.3)

## 2010-10-24 LAB — TROPONIN I
Troponin I: 0.01 ng/mL (ref 0.00–0.06)
Troponin I: 0.01 ng/mL (ref 0.00–0.06)

## 2010-10-24 LAB — APTT: aPTT: 31 seconds (ref 24–37)

## 2010-10-24 LAB — PROTIME-INR: INR: 1.03 (ref 0.00–1.49)

## 2010-10-24 LAB — URINE MICROSCOPIC-ADD ON

## 2010-11-28 ENCOUNTER — Other Ambulatory Visit: Payer: Self-pay | Admitting: *Deleted

## 2010-11-28 DIAGNOSIS — E785 Hyperlipidemia, unspecified: Secondary | ICD-10-CM

## 2010-11-28 DIAGNOSIS — K219 Gastro-esophageal reflux disease without esophagitis: Secondary | ICD-10-CM

## 2010-11-28 MED ORDER — SIMVASTATIN 40 MG PO TABS: 40.0000 mg | ORAL_TABLET | Freq: Every evening | ORAL | Status: DC

## 2010-11-28 MED ORDER — OMEPRAZOLE 40 MG PO CPDR: 40.0000 mg | DELAYED_RELEASE_CAPSULE | Freq: Every day | ORAL | Status: DC

## 2010-12-06 ENCOUNTER — Encounter: Payer: Self-pay | Admitting: Internal Medicine

## 2010-12-07 ENCOUNTER — Encounter: Payer: Self-pay | Admitting: Internal Medicine

## 2010-12-07 ENCOUNTER — Ambulatory Visit (INDEPENDENT_AMBULATORY_CARE_PROVIDER_SITE_OTHER): Payer: Medicare Other | Admitting: Internal Medicine

## 2010-12-07 DIAGNOSIS — N259 Disorder resulting from impaired renal tubular function, unspecified: Secondary | ICD-10-CM

## 2010-12-07 DIAGNOSIS — E785 Hyperlipidemia, unspecified: Secondary | ICD-10-CM

## 2010-12-07 DIAGNOSIS — I10 Essential (primary) hypertension: Secondary | ICD-10-CM

## 2010-12-07 NOTE — Assessment & Plan Note (Signed)
No need for labs today Likely will check at next visit

## 2010-12-07 NOTE — Progress Notes (Signed)
  Subjective:    Patient ID: Linda Bentley, female    DOB: 03-Jan-1929, 77 y.o.   MRN: [id number]  HPI  htn---tolerating med. No home BPs  Renal insuff---needs f/u  Lipids_tolerating meds, needs regular f/u  Hyperglycemia---she is trying to lose weight---not exercising very much but she is trying to follow a diet  Past Medical History  Diagnosis Date  . Depression   . GERD (gastroesophageal reflux disease)   . Hyperlipidemia   . Hypertension   . Osteopenia   . Renal insufficiency 2007    creat 1.3  . Hiatal hernia   . LBP (low back pain)   . UNSPECIFIED PERIPHERAL VASCULAR DISEASE 06/08/2010  . ANEMIA-UNSPECIFIED 07/03/2009  . HYPERGLYCEMIA 02/26/2007  . GERD 02/26/2007   Past Surgical History  Procedure Date  . Appendectomy 1962  . Cholecystectomy   . Oophorectomy 1962  . Parathyroidectomy   . Dilation and curettage of uterus 1962  . Wrist fracture surgery 10/01  . Tail bone surgery   . Esophagogastroduodenoscopy     with dilatation Dr Russella Dar  . Tonsillectomy and adenoidectomy   . Ingrown toenails      x3    reports that she has never smoked. She does not have any smokeless tobacco history on file. She reports that she does not drink alcohol or use illicit drugs. family history includes Colon cancer in her sister and Kidney disease in her father. Allergies  Allergen Reactions  . Diltiazem Hcl     REACTION: feeling drunk     Review of Systems  patient denies chest pain, shortness of breath, orthopnea. Denies lower extremity edema, abdominal pain, change in appetite, change in bowel movements. Patient denies rashes, musculoskeletal complaints. No other specific complaints in a complete review of systems.      Objective:   Physical Exam  Well-developed well-nourished female in no acute distress. HEENT exam atraumatic, normocephalic, extraocular muscles are intact. Neck is supple. No jugular venous distention no thyromegaly. Chest clear to auscultation without  increased work of breathing. Cardiac exam S1 and S2 are regular. Abdominal exam active bowel sounds, soft, nontender. Extremities no edema-but ankles are "puffy". Neurologic exam she is alert without any motor sensory deficits. Gait is normal.        Assessment & Plan:

## 2010-12-07 NOTE — Assessment & Plan Note (Signed)
Controlled Continue same meds 

## 2010-12-07 NOTE — Assessment & Plan Note (Signed)
Controlled Will likely resolve with more aggressive weight loss

## 2010-12-21 NOTE — Cardiovascular Report (Signed)
[facility]  Patient:    Linda Bentley, Linda Bentley                      MRN: [id number] Proc. Date: 12/25/00 Adm. Date:  [id number] Attending:  Veneda Melter CC:         Madolyn Frieze. Jens Som, M.D. [facility]             Bruce H. Swords, M.D. [facility]                        Cardiac Catheterization  PROCEDURES PERFORMED: 1. Left heart catheterization. 2. Left ventriculogram. 3. Selective coronary angiography.  DIAGNOSES: 1. Trivial coronary artery disease by angiogram. 2. Normal left ventricular systolic function.  HISTORY:  Linda Bentley is a 77 year old female who presents with recurrent substernal chest discomfort and dyspnea on exertion.  The patient recently underwent stress imaging study showing ischemia in the apical wall.  She presents for further cardiac assessment.  DESCRIPTION OF PROCEDURE:  Informed consent was obtained.  The patient was brought to the catheterization lab.  A 6-French was placed in the right femoral artery.  Left heart catheterization and selective angiography was then performed in the usual fashion using preformed 6-French Judkins catheters.  At the termination of the case, the catheters and sheath were removed.  Manual pressure was applied until adequate hemostasis was achieved.  The patient tolerated the procedure well and was transferred to the floor in stable condition.  Findings are as follows:  FINDINGS: 1. Left main trunk:  Angiographically normal. 2. LAD:  This is a medium caliber vessel that provides two diagonal branches.    There are luminal irregularities in the LAD system. 3. Left circumflex artery:  Large caliber vessel that provides two marginal    branches.  There are luminal irregularities in the left circumflex system. 4. Right coronary artery:  Dominant.  This is a medium caliber vessel that    provides a small posterior descending artery and several small posterior    ventricular branches in the terminal segment.  The  right coronary artery    has luminal irregularities.  LV:  Normal end systolic and end diastolic dimensions.  Overall left ventricular function is well preserved.  Ejection fraction of greater than 55%.  No mitral regurgitation.  LV pressure is 145/15.  Aortic was 145/65. LVEDP equals 25.  ASSESSMENT AND PLAN:  Linda Bentley is a 77 year old female with trivial coronary artery disease and normal LV function.  This will be medically managed.  The patient does have evidence of mild increase in filling pressures. Consideration may be given towards diastolic dysfunction. DD:  12/25/00 TD:  12/25/00 Job: [id number]

## 2010-12-21 NOTE — Procedures (Signed)
Linda Bentley, Linda Bentley               ACCOUNT NO.:  [id number]   MEDICAL RECORD NO.:  [id number]          PATIENT TYPE:  OUT   LOCATION:  SLEEP CENTER                 FACILITY[facility]  [facility]   PHYSICIAN:  Marcelyn Bruins, M.D. [facility] DATE OF BIRTH:  03-Apr-1929   DATE OF STUDY:  01/07/2005                              NOCTURNAL POLYSOMNOGRAM   REFERRING PHYSICIAN:  Bruce H. Swords, M.D.   INDICATIONS:  Persistent disorder of initiating and maintaining sleep.   EPWORTH SCORE:  Epworth score is three.   SLEEP ARCHITECTURE:  The patient had total sleep time of only 164 minutes  with sleep efficiency of only 36%. There was decreased slow wave sleep and  the patient never achieved REM. Sleep onset latency was prolonged at 136  minutes.   IMPRESSION:  1.  Small numbers of obstructive events which do not meet the respiratory      disturbance index criteria for the obstructive sleep apnea syndrome. The      patient had a very mild snoring during the study.  2.  Very delayed sleep onset with decreased total sleep time. The patient      states that she did not take her lorazepam and she usually does at home.  3.  Occasional premature ventricular contractures and fusion beats, but no      clinically significant cardiac arrhythmia.  4.  Occasional spontaneous O2 desaturations into the high 80s that were not      associated with an obstructive event.  5.  Small numbers of leg jerks with possible significant sleep disruption.      Clinical correlation is suggested.     ______________________________  Suzzette Righter    KC/MEDQ  D:  01/18/2005 15:33:35  T:  01/19/2005 06:46:59  Job:  [id number]

## 2011-01-01 ENCOUNTER — Other Ambulatory Visit: Payer: Self-pay | Admitting: *Deleted

## 2011-01-01 MED ORDER — LORAZEPAM 0.5 MG PO TABS: 0.5000 mg | ORAL_TABLET | Freq: Every day | ORAL | Status: DC | PRN

## 2011-03-06 ENCOUNTER — Encounter: Payer: Self-pay | Admitting: Internal Medicine

## 2011-03-06 ENCOUNTER — Ambulatory Visit (INDEPENDENT_AMBULATORY_CARE_PROVIDER_SITE_OTHER): Payer: Medicare Other | Admitting: Internal Medicine

## 2011-03-06 VITALS — BP 102/66 | HR 92 | Temp 98.9°F | Wt 216.0 lb

## 2011-03-06 DIAGNOSIS — E785 Hyperlipidemia, unspecified: Secondary | ICD-10-CM

## 2011-03-06 DIAGNOSIS — G5600 Carpal tunnel syndrome, unspecified upper limb: Secondary | ICD-10-CM | POA: Insufficient documentation

## 2011-03-06 DIAGNOSIS — D649 Anemia, unspecified: Secondary | ICD-10-CM

## 2011-03-06 DIAGNOSIS — M549 Dorsalgia, unspecified: Secondary | ICD-10-CM | POA: Insufficient documentation

## 2011-03-06 DIAGNOSIS — N259 Disorder resulting from impaired renal tubular function, unspecified: Secondary | ICD-10-CM

## 2011-03-06 DIAGNOSIS — R7309 Other abnormal glucose: Secondary | ICD-10-CM

## 2011-03-06 LAB — CBC WITH DIFFERENTIAL/PLATELET
Basophils Relative: 0.3 % (ref 0.0–3.0)
Eosinophils Absolute: 0.3 10*3/uL (ref 0.0–0.7)
Eosinophils Relative: 4.3 % (ref 0.0–5.0)
HCT: 32.7 % — ABNORMAL LOW (ref 36.0–46.0)
Lymphs Abs: 1.9 10*3/uL (ref 0.7–4.0)
MCHC: 33.5 g/dL (ref 30.0–36.0)
MCV: 93.5 fl (ref 78.0–100.0)
Monocytes Absolute: 0.3 10*3/uL (ref 0.1–1.0)
Platelets: 190 10*3/uL (ref 150.0–400.0)
RBC: 3.5 Mil/uL — ABNORMAL LOW (ref 3.87–5.11)
WBC: 6.5 10*3/uL (ref 4.5–10.5)

## 2011-03-06 LAB — BASIC METABOLIC PANEL
CO2: 27 mEq/L (ref 19–32)
Calcium: 9.2 mg/dL (ref 8.4–10.5)
Creatinine, Ser: 2 mg/dL — ABNORMAL HIGH (ref 0.4–1.2)
Glucose, Bld: 113 mg/dL — ABNORMAL HIGH (ref 70–99)

## 2011-03-06 LAB — LIPID PANEL: Total CHOL/HDL Ratio: 3

## 2011-03-06 NOTE — Assessment & Plan Note (Signed)
Check labs today She needs to concentrate on aggressive weight loss

## 2011-03-06 NOTE — Assessment & Plan Note (Signed)
By hx Will try wrist braces, she will call if sxs persist

## 2011-03-06 NOTE — Assessment & Plan Note (Signed)
Check labs today.

## 2011-03-06 NOTE — Progress Notes (Signed)
  Subjective:    Patient ID: Linda Bentley, female    DOB: 07-24-1929, 77 y.o.   MRN: [id number]  HPI  Patient Active Problem List  Diagnoses  . HYPERLIPIDEMIA---tolerating simvastatin  . OBESITY---not following a diet or exercise plan  .   Marland Kitchen DEPRESSION---doing well on meds  . HYPERTENSION-tolerating meds, not checking home bps  .   .   . RENAL INSUFFICIENCY---will need f/u  .   Marland Kitchen   Marland Kitchen    Past Medical History  Diagnosis Date  . Depression   . GERD (gastroesophageal reflux disease)   . Hyperlipidemia   . Hypertension   . Osteopenia   . Renal insufficiency 2007    creat 1.3  . Hiatal hernia   . LBP (low back pain)   . UNSPECIFIED PERIPHERAL VASCULAR DISEASE 06/08/2010  . ANEMIA-UNSPECIFIED 07/03/2009  . HYPERGLYCEMIA 02/26/2007  . GERD 02/26/2007   Past Surgical History  Procedure Date  . Appendectomy 1962  . Cholecystectomy   . Oophorectomy 1962  . Parathyroidectomy   . Dilation and curettage of uterus 1962  . Wrist fracture surgery 10/01  . Tail bone surgery   . Esophagogastroduodenoscopy     with dilatation Dr Russella Dar  . Tonsillectomy and adenoidectomy   . Ingrown toenails      x3    reports that she has never smoked. She does not have any smokeless tobacco history on file. She reports that she does not drink alcohol or use illicit drugs. family history includes Colon cancer in her sister and Kidney disease in her father. Allergies  Allergen Reactions  . Diltiazem Hcl     REACTION: feeling drunk      Review of Systems  patient denies chest pain, shortness of breath, orthopnea. Denies lower extremity edema, abdominal pain, change in appetite, change in bowel movements. Patient denies rashes, musculoskeletal complaints. No other specific complaints in a complete review of systems. Except she does complain of nocturnal hand numbness.      Objective:   Physical Exam  Well-developed well-nourished female in no acute distress. HEENT exam atraumatic,  normocephalic, extraocular muscles are intact. Neck is supple. No jugular venous distention no thyromegaly. Chest clear to auscultation without increased work of breathing. Cardiac exam S1 and S2 are regular. Abdominal exam active bowel sounds, soft, nontender, obese. Extremities no edema. Neurologic exam she is alert without any motor sensory deficits. Gait is normal.        Assessment & Plan:

## 2011-03-06 NOTE — Assessment & Plan Note (Signed)
Check labs today---could be related to renal insufficiency

## 2011-03-06 NOTE — Assessment & Plan Note (Signed)
Long term problem I'm not interested in imaging at this time Refer PT

## 2011-03-06 NOTE — Patient Instructions (Signed)
Wear wrist braces at night as we discussed

## 2011-03-06 NOTE — Assessment & Plan Note (Signed)
Needs f/u labs 

## 2011-03-07 ENCOUNTER — Other Ambulatory Visit: Payer: Self-pay | Admitting: *Deleted

## 2011-03-07 ENCOUNTER — Other Ambulatory Visit: Payer: Self-pay | Admitting: Internal Medicine

## 2011-03-07 DIAGNOSIS — R944 Abnormal results of kidney function studies: Secondary | ICD-10-CM

## 2011-03-07 LAB — HEPATIC FUNCTION PANEL
AST: 23 U/L (ref 0–37)
Alkaline Phosphatase: 52 U/L (ref 39–117)
Bilirubin, Direct: 0 mg/dL (ref 0.0–0.3)
Total Bilirubin: 0.5 mg/dL (ref 0.3–1.2)

## 2011-03-07 MED ORDER — FUROSEMIDE 20 MG PO TABS: 20.0000 mg | ORAL_TABLET | Freq: Two times a day (BID) | ORAL | Status: DC

## 2011-03-07 MED ORDER — LABETALOL HCL 100 MG PO TABS: 100.0000 mg | ORAL_TABLET | Freq: Every evening | ORAL | Status: DC | PRN

## 2011-03-07 MED ORDER — LISINOPRIL 40 MG PO TABS: 40.0000 mg | ORAL_TABLET | Freq: Every day | ORAL | Status: DC

## 2011-03-20 ENCOUNTER — Ambulatory Visit: Payer: Medicare Other | Attending: Internal Medicine | Admitting: Physical Therapy

## 2011-03-20 DIAGNOSIS — M545 Low back pain, unspecified: Secondary | ICD-10-CM | POA: Insufficient documentation

## 2011-03-20 DIAGNOSIS — IMO0001 Reserved for inherently not codable concepts without codable children: Secondary | ICD-10-CM | POA: Insufficient documentation

## 2011-03-20 DIAGNOSIS — M2569 Stiffness of other specified joint, not elsewhere classified: Secondary | ICD-10-CM | POA: Insufficient documentation

## 2011-03-25 ENCOUNTER — Ambulatory Visit: Payer: Medicare Other | Admitting: Physical Therapy

## 2011-03-28 ENCOUNTER — Ambulatory Visit: Payer: Medicare Other | Admitting: Physical Therapy

## 2011-04-01 ENCOUNTER — Ambulatory Visit: Payer: Medicare Other | Admitting: Physical Therapy

## 2011-04-04 ENCOUNTER — Ambulatory Visit: Payer: Medicare Other | Admitting: Physical Therapy

## 2011-04-05 ENCOUNTER — Other Ambulatory Visit: Payer: Self-pay | Admitting: *Deleted

## 2011-04-05 MED ORDER — LORAZEPAM 0.5 MG PO TABS: 0.5000 mg | ORAL_TABLET | Freq: Every day | ORAL | Status: DC | PRN

## 2011-04-08 ENCOUNTER — Emergency Department (HOSPITAL_COMMUNITY): Payer: Medicare Other

## 2011-04-08 ENCOUNTER — Inpatient Hospital Stay (HOSPITAL_COMMUNITY)
Admission: EM | Admit: 2011-04-08 | Discharge: 2011-04-15 | DRG: 439 | Disposition: A | Discharge status: Home Health | Payer: Medicare Other | Attending: Internal Medicine | Admitting: Internal Medicine

## 2011-04-08 DIAGNOSIS — F3289 Other specified depressive episodes: Secondary | ICD-10-CM | POA: Diagnosis present

## 2011-04-08 DIAGNOSIS — I129 Hypertensive chronic kidney disease with stage 1 through stage 4 chronic kidney disease, or unspecified chronic kidney disease: Secondary | ICD-10-CM | POA: Diagnosis present

## 2011-04-08 DIAGNOSIS — R5381 Other malaise: Secondary | ICD-10-CM | POA: Diagnosis present

## 2011-04-08 DIAGNOSIS — K5900 Constipation, unspecified: Secondary | ICD-10-CM | POA: Diagnosis not present

## 2011-04-08 DIAGNOSIS — Z23 Encounter for immunization: Secondary | ICD-10-CM

## 2011-04-08 DIAGNOSIS — E875 Hyperkalemia: Secondary | ICD-10-CM | POA: Diagnosis present

## 2011-04-08 DIAGNOSIS — N179 Acute kidney failure, unspecified: Secondary | ICD-10-CM | POA: Diagnosis present

## 2011-04-08 DIAGNOSIS — N183 Chronic kidney disease, stage 3 unspecified: Secondary | ICD-10-CM | POA: Diagnosis present

## 2011-04-08 DIAGNOSIS — Z79899 Other long term (current) drug therapy: Secondary | ICD-10-CM

## 2011-04-08 DIAGNOSIS — E214 Other specified disorders of parathyroid gland: Secondary | ICD-10-CM | POA: Diagnosis present

## 2011-04-08 DIAGNOSIS — K859 Acute pancreatitis without necrosis or infection, unspecified: Principal | ICD-10-CM | POA: Diagnosis present

## 2011-04-08 DIAGNOSIS — F329 Major depressive disorder, single episode, unspecified: Secondary | ICD-10-CM | POA: Diagnosis present

## 2011-04-08 DIAGNOSIS — D649 Anemia, unspecified: Secondary | ICD-10-CM | POA: Diagnosis present

## 2011-04-08 DIAGNOSIS — E785 Hyperlipidemia, unspecified: Secondary | ICD-10-CM | POA: Diagnosis present

## 2011-04-08 DIAGNOSIS — Z7982 Long term (current) use of aspirin: Secondary | ICD-10-CM

## 2011-04-08 LAB — COMPREHENSIVE METABOLIC PANEL
ALT: 13 U/L (ref 0–35)
AST: 20 U/L (ref 0–37)
CO2: 24 mEq/L (ref 19–32)
Chloride: 101 mEq/L (ref 96–112)
GFR calc non Af Amer: 26 mL/min — ABNORMAL LOW (ref >60)
Sodium: 136 mEq/L (ref 135–145)
Total Bilirubin: 0.3 mg/dL (ref 0.3–1.2)

## 2011-04-08 LAB — COMPREHENSIVE METABOLIC PANEL WITH GFR
Albumin: 3.9 g/dL (ref 3.5–5.2)
Alkaline Phosphatase: 56 U/L (ref 39–117)
BUN: 46 mg/dL — ABNORMAL HIGH (ref 6–23)
Calcium: 9.1 mg/dL (ref 8.4–10.5)
Creatinine, Ser: 1.87 mg/dL — ABNORMAL HIGH (ref 0.50–1.10)
GFR calc Af Amer: 31 mL/min — ABNORMAL LOW (ref >60)
Glucose, Bld: 189 mg/dL — ABNORMAL HIGH (ref 70–99)
Potassium: 5.7 meq/L — ABNORMAL HIGH (ref 3.5–5.1)
Total Protein: 6.9 g/dL (ref 6.0–8.3)

## 2011-04-08 LAB — CBC
HCT: 33.1 % — ABNORMAL LOW (ref 36.0–46.0)
Hemoglobin: 10.9 g/dL — ABNORMAL LOW (ref 12.0–15.0)
MCH: 30.2 pg (ref 26.0–34.0)
MCHC: 32.9 g/dL (ref 30.0–36.0)
MCV: 91.7 fL (ref 78.0–100.0)
Platelets: 169 K/uL (ref 150–400)
RBC: 3.61 MIL/uL — ABNORMAL LOW (ref 3.87–5.11)
RDW: 12.3 % (ref 11.5–15.5)
WBC: 10.5 10*3/uL (ref 4.0–10.5)

## 2011-04-09 ENCOUNTER — Encounter: Payer: Medicare Other | Admitting: Physical Therapy

## 2011-04-09 ENCOUNTER — Inpatient Hospital Stay (HOSPITAL_COMMUNITY): Payer: Medicare Other

## 2011-04-09 LAB — BASIC METABOLIC PANEL
BUN: 45 mg/dL — ABNORMAL HIGH (ref 6–23)
CO2: 26 mEq/L (ref 19–32)
Calcium: 8.5 mg/dL (ref 8.4–10.5)
Creatinine, Ser: 1.76 mg/dL — ABNORMAL HIGH (ref 0.50–1.10)
GFR calc non Af Amer: 28 mL/min — ABNORMAL LOW (ref >60)
Glucose, Bld: 129 mg/dL — ABNORMAL HIGH (ref 70–99)
Sodium: 138 mEq/L (ref 135–145)

## 2011-04-09 LAB — HEPATIC FUNCTION PANEL
ALT: 12 U/L (ref 0–35)
AST: 15 U/L (ref 0–37)
Albumin: 3.6 g/dL (ref 3.5–5.2)
Alkaline Phosphatase: 53 U/L (ref 39–117)
Total Bilirubin: 0.3 mg/dL (ref 0.3–1.2)

## 2011-04-09 LAB — LIPASE, BLOOD: Lipase: 2185 U/L — ABNORMAL HIGH (ref 11–59)

## 2011-04-09 LAB — CARDIAC PANEL(CRET KIN+CKTOT+MB+TROPI)
CK, MB: 2.6 ng/mL (ref 0.3–4.0)
Relative Index: 2.1 (ref 0.0–2.5)
Troponin I: <0.3 ng/mL (ref <0.30)

## 2011-04-09 LAB — GLUCOSE, CAPILLARY
Glucose-Capillary: 112 mg/dL — ABNORMAL HIGH (ref 70–99)
Glucose-Capillary: 110 mg/dL — ABNORMAL HIGH (ref 70–99)

## 2011-04-09 LAB — LIPID PANEL
Cholesterol: 155 mg/dL (ref 0–200)
HDL: 56 mg/dL (ref >39)
Total CHOL/HDL Ratio: 2.8 RATIO
VLDL: 18 mg/dL (ref 0–40)

## 2011-04-09 LAB — TSH: TSH: 1.252 u[IU]/mL (ref 0.350–4.500)

## 2011-04-09 LAB — CBC
HCT: 30.9 % — ABNORMAL LOW (ref 36.0–46.0)
Hemoglobin: 10.4 g/dL — ABNORMAL LOW (ref 12.0–15.0)
MCH: 30.7 pg (ref 26.0–34.0)
MCHC: 33.7 g/dL (ref 30.0–36.0)
RBC: 3.39 MIL/uL — ABNORMAL LOW (ref 3.87–5.11)

## 2011-04-09 LAB — CK TOTAL AND CKMB (NOT AT ARMC): CK, MB: 2.1 ng/mL (ref 0.3–4.0)

## 2011-04-09 NOTE — H&P (Signed)
Linda Bentley, Linda Bentley               ACCOUNT NO.:  [id number]  MEDICAL RECORD NO.:  [id number]  LOCATION:  [facility]                         FACILITY:  [facility]  PHYSICIAN:  Eduard Clos, MDDATE OF BIRTH:  July 27, 1929  DATE OF ADMISSION:  04/08/2011 DATE OF DISCHARGE:                             HISTORY & PHYSICAL   PRIMARY CARE PHYSICIAN:  Valetta Mole. Swords, MD  CHIEF COMPLAINT:  Chest pain and abdominal pain.  HISTORY OF PRESENTING ILLNESS:  A 77 year old female with known history of hypertension, chronic kidney disease, chronic anemia, hyperlipidemia has been experiencing some chest pain and abdominal pain over the last 2 days.  The pain is mostly stabbing in nature, radiating to back, and has become more diffuse all over the abdomen.  In addition, the patient had some nausea, vomiting.  Denies any diarrhea.  Denies any fever, chills, shortness of breath, cough or phlegm.  The patient came to the ER.  In the ER, the patient had a CT abdomen and pelvis, at this time which is showing features consistent with acute pancreatitis.  The patient also had elevated lipase.  The patient denies any dizziness, loss of consciousness.  Denies any focal deficit.  Denies any dysuria, discharge, or diarrhea.  At this time, the pain is controlled with pain relief medication.  PAST MEDICAL HISTORY: 1. Hypertension. 2. Hyperlipidemia. 3. Chronic kidney disease. 4. Chronic anemia.  PAST SURGICAL HISTORY:  Cholecystectomy, appendectomy, parathyroidectomy, oophorectomy.  MEDICATIONS ON ADMISSION:  It should be verified that includes aspirin, furosemide, labetalol, lisinopril, lorazepam, omeprazole and simvastatin.  ALLERGIES:  No known drug allergies.  FAMILY HISTORY:  Nothing contributory.  SOCIAL HISTORY:  The patient lives alone and is frequented by her family.  Her brother still is like nearby.  Denies smoking cigarettes, drinking alcohol, or using illegal drugs.  REVIEW OF SYSTEMS:   As per pertinent history of presenting illness. Nothing else significant.  PHYSICAL EXAMINATION:  GENERAL:  The patient examined at bedside, not in acute distress. VITAL SIGNS:  Blood pressure 140/60, pulse 60 per minute, temperature 97.9, respirations 18 per minute, O2 sat 98%. HEENT:  Anicteric.  No pallor.  No discharge from ears, eyes, nose or mouth. CHEST:  Bilateral air entry present.  No rhonchi.  No crepitation. HEART:  S1 and S2 heard. ABDOMEN:  Soft, mildly tender diffusely.  No guarding.  No rigidity. CNS:  Alert, awake, and oriented to time, place, and person.  Moves upper and lower extremities, 5/5. EXTREMITIES:  Chronic nonpitting edema over both lower extremities.  No acute ischemic changes, cyanosis, or clubbing.  LABORATORY DATA:  EKG shows normal sinus rhythm with nonspecific ST-T changes.  Heart rate is 172 beats per minute.  Chest x-ray shows shallow inspiration with linear fibrosis or atelectasis in the right midlung and left lung base.  CT abdomen and pelvis without contrast shows significant stranding in the region of the pancreatic head, findings stable with acute pancreatitis.  No evidence for pseudocyst or abscess. Differential diagnosis also includes duodenitis or peptic ulcer disease given the clear correlation with lab findings recommended.  CBC:  WBC 10.5, hemoglobin 10.9, hematocrit 33.1, platelets 159.  Basic metabolic panel:  Sodium 136, potassium 5.7,  chloride 1, carbon dioxide 24, glucose 189, BUN 46, creatinine 1.8, total bilirubin is 0.3, alk phos 36, AST 20, ALT 13, total protein 6.9, albumin 3.9, calcium 9.1, lipase 2185, troponin 0 and the second one is 0.01.  ASSESSMENT: 1. Acute pancreatitis. 2. Chronic kidney disease with mild hyperkalemia. 3. History of hypertension. 4. History of chronic anemia. 5. History of hyperlipidemia.  PLAN: 1. At this time, we will admit the patient to telemetry. 2. For her acute pancreatitis, at this time  keep the patient n.p.o.,     gently hydrate the patient, careful not to overhydrate as the     patient does have chronic kidney disease, strict intake, output,     and daily weights.  As the patient does show some mild hyperkalemia     at this time, I will repeat labs again, stat.  We do not know the     exact cause for her pancreatitis at this time.  We will check into     fasting lipid panel to make sure there is no hypertriglyceridemia,     although caused probably from some medications.  If the patient's     pain gets better, then slowly we can start clear liquid diet and     advance as tolerated.  At this time, we will also be cycling     cardiac markers. Her chest pain is most likely from acute     pancreatitis.     Eduard Clos, MD     ANK/MEDQ  D:  04/09/2011  T:  04/09/2011  Job:  [id number]  cc:   Valetta Mole. Swords, MD  Electronically Signed by Midge Minium MD on 04/09/2011 09:50:09 AM

## 2011-04-10 LAB — CBC
HCT: 32.4 % — ABNORMAL LOW (ref 36.0–46.0)
MCH: 31.1 pg (ref 26.0–34.0)
MCV: 93.4 fL (ref 78.0–100.0)
RBC: 3.47 MIL/uL — ABNORMAL LOW (ref 3.87–5.11)
RDW: 12.4 % (ref 11.5–15.5)
WBC: 13.2 10*3/uL — ABNORMAL HIGH (ref 4.0–10.5)

## 2011-04-10 LAB — COMPREHENSIVE METABOLIC PANEL
BUN: 31 mg/dL — ABNORMAL HIGH (ref 6–23)
CO2: 23 mEq/L (ref 19–32)
Calcium: 8.4 mg/dL (ref 8.4–10.5)
Chloride: 107 mEq/L (ref 96–112)
Creatinine, Ser: 1.47 mg/dL — ABNORMAL HIGH (ref 0.50–1.10)
GFR calc Af Amer: 41 mL/min — ABNORMAL LOW (ref >60)
GFR calc non Af Amer: 34 mL/min — ABNORMAL LOW (ref >60)
Total Bilirubin: 0.6 mg/dL (ref 0.3–1.2)

## 2011-04-10 LAB — GLUCOSE, CAPILLARY: Glucose-Capillary: 108 mg/dL — ABNORMAL HIGH (ref 70–99)

## 2011-04-10 LAB — LIPASE, BLOOD: Lipase: 293 U/L — ABNORMAL HIGH (ref 11–59)

## 2011-04-10 LAB — AMYLASE: Amylase: 50 U/L (ref 0–105)

## 2011-04-11 ENCOUNTER — Encounter: Payer: Medicare Other | Admitting: Physical Therapy

## 2011-04-11 LAB — BASIC METABOLIC PANEL
BUN: 20 mg/dL (ref 6–23)
Calcium: 8.4 mg/dL (ref 8.4–10.5)
Creatinine, Ser: 1.22 mg/dL — ABNORMAL HIGH (ref 0.50–1.10)
GFR calc non Af Amer: 43 mL/min — ABNORMAL LOW (ref >60)
Glucose, Bld: 76 mg/dL (ref 70–99)
Potassium: 4.4 mEq/L (ref 3.5–5.1)

## 2011-04-11 LAB — GLUCOSE, CAPILLARY
Glucose-Capillary: 80 mg/dL (ref 70–99)
Glucose-Capillary: 71 mg/dL (ref 70–99)
Glucose-Capillary: 79 mg/dL (ref 70–99)
Glucose-Capillary: 86 mg/dL (ref 70–99)
Glucose-Capillary: 109 mg/dL — ABNORMAL HIGH (ref 70–99)

## 2011-04-12 LAB — CBC
MCV: 91.1 fL (ref 78.0–100.0)
Platelets: 156 10*3/uL (ref 150–400)
RDW: 12.3 % (ref 11.5–15.5)
WBC: 13 10*3/uL — ABNORMAL HIGH (ref 4.0–10.5)

## 2011-04-12 LAB — GLUCOSE, CAPILLARY
Glucose-Capillary: 121 mg/dL — ABNORMAL HIGH (ref 70–99)
Glucose-Capillary: 128 mg/dL — ABNORMAL HIGH (ref 70–99)
Glucose-Capillary: 129 mg/dL — ABNORMAL HIGH (ref 70–99)
Glucose-Capillary: 153 mg/dL — ABNORMAL HIGH (ref 70–99)

## 2011-04-12 LAB — COMPREHENSIVE METABOLIC PANEL
AST: 22 U/L (ref 0–37)
CO2: 21 mEq/L (ref 19–32)
Calcium: 8.4 mg/dL (ref 8.4–10.5)
Creatinine, Ser: 1.11 mg/dL — ABNORMAL HIGH (ref 0.50–1.10)
GFR calc Af Amer: 57 mL/min — ABNORMAL LOW (ref >60)
GFR calc non Af Amer: 47 mL/min — ABNORMAL LOW (ref >60)
Glucose, Bld: 124 mg/dL — ABNORMAL HIGH (ref 70–99)

## 2011-04-13 DIAGNOSIS — K859 Acute pancreatitis without necrosis or infection, unspecified: Secondary | ICD-10-CM

## 2011-04-13 LAB — GLUCOSE, CAPILLARY

## 2011-04-17 ENCOUNTER — Telehealth: Payer: Self-pay | Admitting: Internal Medicine

## 2011-04-17 NOTE — Telephone Encounter (Signed)
See me week of 9/24 Or Dr. Artist Pais next week

## 2011-04-17 NOTE — Telephone Encounter (Signed)
Pt was in hosp for acute pancreatis and was discharged on Mon 9/10 and is needing to get a work in hosp fup asap with Dr Cato Mulligan. Pls advise.

## 2011-04-18 NOTE — Telephone Encounter (Signed)
I called pt and sch her to see Dr Artist Pais on 9/24 at 2:30 for hosp fup as noted.

## 2011-04-19 NOTE — Consult Note (Signed)
NAMETOSCA, PLETZ               ACCOUNT NO.:  [id number]  MEDICAL RECORD NO.:  [id number]  LOCATION:  4733                         FACILITY:  [facility]  PHYSICIAN:  Jordan Hawks. Elnoria Howard, MD    DATE OF BIRTH:  [birth year]  DATE OF CONSULTATION:  04/09/2011 DATE OF DISCHARGE:                                CONSULTATION   This is an unassigned [facility] patient.  PRIMARY CARE PROVIDER:  Valetta Mole. Swords, MD  HISTORY OF PRESENT ILLNESS:  This is a 77 year old female with past medical history of hypertension, hyperlipidemia, chronic renal insufficiency, chronic anemia, status post cholecystectomy for gallstones 5 years ago, status post appendectomy, status post parathyroidectomy, and status post oophorectomy who was admitted to the hospital with an acute pancreatitis.  The patient started having chest pain and subsequently it radiated down into her abdomen.  There is also some reports of pain into her back.  As a result of her symptoms, she presented to the emergency room for further evaluation and treatment. CT scan of the abdomen did reveal that there is an acute pancreatitis and her lipase is in the 2000 range.  She reports taking one Lasix tablet at home which is a routine medication and then falling asleep and she woke up as result of the pain.  She denies taking any other day new medications and no history of alcohol use.  PAST MEDICAL AND SURGICAL HISTORY:  As stated above.  FAMILY HISTORY:  Noncontributory.  SOCIAL HISTORY:  Negative for alcohol, tobacco, or illicit drug use.  ALLERGIES:  No known drug allergies.  MEDICATIONS: 1. Protonix 40 mg IV daily. 2. Tylenol 650 mg p.o. q. 4 h. p.r.n. 3. Dilaudid 0.5 to 1 mg IV q. 4 hours. 4. Labetalol 10 mg IV q. 4 hours. 5. Zofran 4 mg IV q. 6 hours.  PHYSICAL EXAMINATION:  VITAL SIGNS:  Blood pressure is 135/66, heart rate is 80, respirations 20, temperature is 98.0. GENERAL:  The patient is in no acute distress, alert  and oriented. HEENT:  Normocephalic, atraumatic.  Extraocular muscles intact. NECK:  Supple.  No lymphadenopathy. LUNGS:  Clear to auscultation bilaterally. CARDIOVASCULAR:  Regular rate and rhythm. ABDOMEN:  Obese, soft, tender in the epigastric region.  No rebound or rigidity.  Positive bowel sounds. EXTREMITIES:  No clubbing, cyanosis or edema.  LABORATORY VALUES:  White blood cell count is 8.3, hemoglobin 10.4, MCV is 91.2, platelets at 145.  Sodium 138, potassium 5.1, chloride 104, CO2 26, glucose 129, BUN 45, creatinine 1.7, total bilirubin 0.3, alk phos is 53, AST 15, ALT 12, albumin is 3.6, lipase is 2185.  IMPRESSION: 1. Acute pancreatitis of unclear etiology. 2. Chronic renal insufficiency.  The patient has acute pancreatitis documented with blood work as well as her clinical presentation and CT scan.  I am uncertain about the cause of her pancreatitis at this time.  She did have a history of gallstones in the past and she is status post cholecystectomy.  There could be the possibility of microlithiasis although her transaminases are not abnormal.  There may be an association with a drug reaction with regards to the use of Lasix.  It does have a so  called component within the Lasix that can result in pancreatitis and that is also another issue. Per Renal, the patient appears to be baseline at this time.  PLAN: 1. To continue with IV hydration because of her age and other medical     issues.  She can only be gently hydrated.  Recommendations to     increase the IV fluids from 100 mL per hour to 150 mL per hour. 2. Maintain strict n.p.o.  Further recommendations will be made     pending her clinical course.     Jordan Hawks Elnoria Howard, MD     PDH/MEDQ  D:  04/09/2011  T:  04/09/2011  Job:  [id number]  cc:   Valetta Mole. Swords, MD  Electronically Signed by Jeani Hawking MD on 04/19/2011 08:49:57 AM

## 2011-04-26 NOTE — Discharge Summary (Signed)
Linda Bentley, Linda Bentley               ACCOUNT NO.:  [id number]  MEDICAL RECORD NO.:  [id number]  LOCATION:  4733                         FACILITY:  [facility]  PHYSICIAN:  Lonia Blood, M.D.       DATE OF BIRTH:  12-23-1928  DATE OF ADMISSION:  04/08/2011 DATE OF DISCHARGE:  04/15/2011                              DISCHARGE SUMMARY   PRIMARY CARE PHYSICIAN:  Valetta Mole. Swords, MD  DISCHARGE DIAGNOSES: 1. Idiopathic pancreatitis, resolved. 2. Hypertension. 3. Hyperlipidemia. 4. Chronic kidney disease, stage II. 5. Morbid obesity. 6. Weakness and deconditioning, improved.  The patient is status post cholecystectomy, appendectomy, and oophorectomy.  DISCHARGE MEDICATIONS: 1. Tylenol 650 mg by mouth daily as needed for pain. 2. Aspirin 81 mg daily. 3. Calcium with vitamin D 600 mg twice a day. 4. Celexa 20 mg daily. 5. Fish oil 1000 mg daily. 6. Lasix 20 mg daily. 7. Labetalol 100 mg daily. 8. Lisinopril 40 mg daily. 9. Lorazepam 0.5 mg daily as needed. 10.Multivitamin daily. 11.Omeprazole 40 mg daily.  CONDITION ON DISCHARGE:  Linda Bentley was discharged in good condition, alert and oriented, in no acute distress, abdominal pain free, tolerating regular diet.  She will have Home Health come to her house for the first week after discharge from the hospital.  The patient will follow up with Dr. Birdie Sons.  PROCEDURE THIS ADMISSION:  The patient underwent CT scan of abdomen and pelvis with IV contrast on April 09, 2011, showing stranding in the region of pancreatic head, appearing like acute pancreatitis.  CONSULTATION THIS ADMISSION:  The patient was seen in consultation by Jordan Hawks. Elnoria Howard, MD  HOSPITAL COURSE: 1. Acute pancreatitis.  Linda Bentley was admitted with severe abdominal     pain, nausea, and a lipase level of 2185.  She had CT scan of the     abdomen, showing stranding in the region of the pancreas.  The     clinical picture and the radiographic findings, as well  as     laboratory findings were establishing a diagnosis of acute     pancreatitis.  Nevertheless, Linda Bentley does not drink alcohol.     She is status post cholecystectomy, there is no evidence for common     bile duct stones, and she was not on any medications to explain her     pancreatitis.  Overall, it was felt this was a case of idiopathic     pancreatitis.  Linda Bentley was placed on bowel rest, intravenous     fluids, and throughout her 7 days of hospitalization, she improved     to the point where she was able to tolerate a regular diet and was     abdominal pain free. 2. Weakness and deconditioning.  By hospital day #4 when we offered     discharge to Linda Bentley, she felt like she was too weak to go home.     She requested to go to rehab.  By April 15, 2011, Physical     Therapy and Occupational Therapy evaluated the patient and found     her to actually be very strong, capable of self-care.  The patient  was discharged home with family.     Lonia Blood, M.D.     SL/MEDQ  D:  04/16/2011  T:  04/16/2011  Job:  [id number] cc:   Valetta Mole. Swords, MD  Electronically Signed by Lonia Blood M.D. on 04/26/2011 03:20:35 PM

## 2011-04-29 ENCOUNTER — Encounter: Payer: Self-pay | Admitting: Internal Medicine

## 2011-04-29 ENCOUNTER — Ambulatory Visit (INDEPENDENT_AMBULATORY_CARE_PROVIDER_SITE_OTHER): Payer: Medicare Other | Admitting: Internal Medicine

## 2011-04-29 DIAGNOSIS — E785 Hyperlipidemia, unspecified: Secondary | ICD-10-CM

## 2011-04-29 DIAGNOSIS — N259 Disorder resulting from impaired renal tubular function, unspecified: Secondary | ICD-10-CM

## 2011-04-29 DIAGNOSIS — E669 Obesity, unspecified: Secondary | ICD-10-CM

## 2011-04-29 DIAGNOSIS — I10 Essential (primary) hypertension: Secondary | ICD-10-CM

## 2011-04-29 DIAGNOSIS — K859 Acute pancreatitis without necrosis or infection, unspecified: Secondary | ICD-10-CM

## 2011-04-29 DIAGNOSIS — R5381 Other malaise: Secondary | ICD-10-CM

## 2011-04-29 NOTE — Assessment & Plan Note (Signed)
Reassess off statin in 2 months.  F/U with PCP

## 2011-04-29 NOTE — Progress Notes (Signed)
Subjective:    Patient ID: Linda Bentley, female    DOB: Dec 30, 1928, 77 y.o.   MRN: [id number]  HPI 77 year old white female for hospital followup. Patient admitted on 04/08/2011 with acute abdominal pain and diagnosed with acute pancreatitis. CT of abdomen results reviewed.  1. Significant stranding in the region of the pancreatic head.  Findings favor acute pancreatitis. No evidence for pseudocyst or  abscess.  2. Differential diagnosis also includes duodenitis or peptic ulcer  disease given the appearance. Correlation with lab findings is  recommended.  Her lipase was also significantly elevated consistent with pancreatitis. It was noted Linda Bentley does not drink alcohol and she has had previous cholecystectomy. There is no evidence of common bile duct stones. Although not listed on her discharge summary, simvastatin was discontinued as it may have been a contributing factor in her pancreatitis. Overall it was felt her pancreatitis was idiopathic.  She did have significant deconditioning from her hospitalization. Physical and occupational therapy was helpful. However patient feels she is back to her baseline activities.  She is still able to cook and clean for herself. She is accompanied by a close friend.  She has also resumed her normal diet. She denies abdominal pain, nausea or vomiting. Review of Systems Negative nausea or vomiting, negative for diarrhea     Past Medical History  Diagnosis Date  . Depression   . GERD (gastroesophageal reflux disease)   . Hyperlipidemia   . Hypertension   . Osteopenia   . Renal insufficiency 2007    creat 1.3  . Hiatal hernia   . LBP (low back pain)   . UNSPECIFIED PERIPHERAL VASCULAR DISEASE 06/08/2010  . ANEMIA-UNSPECIFIED 07/03/2009  . HYPERGLYCEMIA 02/26/2007  . GERD 02/26/2007    History   Social History  . Marital Status: Widowed    Spouse Name: N/A    Number of Children: N/A  . Years of Education: N/A   Occupational History    . Not on file.   Social History Main Topics  . Smoking status: Never Smoker   . Smokeless tobacco: Not on file  . Alcohol Use: No  . Drug Use: No  . Sexually Active:    Other Topics Concern  . Not on file   Social History Narrative  . No narrative on file    Past Surgical History  Procedure Date  . Appendectomy 1962  . Cholecystectomy   . Oophorectomy 1962  . Parathyroidectomy   . Dilation and curettage of uterus 1962  . Wrist fracture surgery 10/01  . Tail bone surgery   . Esophagogastroduodenoscopy     with dilatation Dr Russella Dar  . Tonsillectomy and adenoidectomy   . Ingrown toenails      x3    Family History  Problem Relation Age of Onset  . Kidney disease Father     failure  . Colon cancer Sister     Allergies  Allergen Reactions  . Diltiazem Hcl     REACTION: feeling drunk    Current Outpatient Prescriptions on File Prior to Visit  Medication Sig Dispense Refill  . acetaminophen (TYLENOL) 650 MG CR tablet Take 650 mg by mouth every 8 (eight) hours as needed.        Marland Kitchen aspirin 81 MG tablet Take 81 mg by mouth daily.        . Calcium Carbonate-Vit D-Min (CALTRATE 600+D PLUS) 600-400 MG-UNIT per tablet Chew 1 tablet by mouth daily.        . citalopram (CELEXA)  20 MG tablet Take 20 mg by mouth daily.        . fish oil-omega-3 fatty acids 1000 MG capsule Take 2 g by mouth daily.        . furosemide (LASIX) 20 MG tablet Take 1 tablet (20 mg total) by mouth 2 (two) times daily.  90 tablet  1  . labetalol (NORMODYNE) 100 MG tablet Take 1 tablet (100 mg total) by mouth at bedtime as needed.  90 tablet  1  . lisinopril (PRINIVIL,ZESTRIL) 40 MG tablet Take 1 tablet (40 mg total) by mouth daily.  90 tablet  1  . LORazepam (ATIVAN) 0.5 MG tablet Take 1 tablet (0.5 mg total) by mouth daily as needed for anxiety.  30 tablet  2  . Multiple Vitamin (MULTIVITAMIN) tablet Take 1 tablet by mouth daily.        Marland Kitchen omeprazole (PRILOSEC) 40 MG capsule Take 1 capsule (40 mg total)  by mouth daily.  90 capsule  1    BP 122/74  Pulse 74  Temp(Src) 98.1 F (36.7 C) (Oral)  Wt 215 lb (97.523 kg)    Objective:   Physical Exam   Constitutional: pleasant, obese, NAD Head: Normocephalic and atraumatic.  Right Ear: External ear normal.  Left Ear: External ear normal.  Mouth/Throat: Oropharynx is clear and moist.  Eyes: Conjunctivae are normal. Pupils are equal, round, and reactive to light.  Neck: Normal range of motion. Neck supple. No thyromegaly present. No carotid bruit Cardiovascular: Normal rate, regular rhythm and normal heart sounds.  Exam reveals no gallop and no friction rub.   No murmur heard. Pulmonary/Chest: Effort normal and breath sounds normal.  No wheezes. No rales.  Abdominal: Soft. Bowel sounds are normal. No mass. There is no tenderness.  Neurological: Alert. No cranial nerve deficit. ambulates without assistive devices,  Normal gait Skin: Skin is warm and dry.  Psychiatric: Normal mood and affect. Behavior is normal.         Assessment & Plan:

## 2011-04-29 NOTE — Assessment & Plan Note (Signed)
Presumed idiopathic versus secondary to medication side effect (simvastatin) She does not use alcohol, she has had previous cholecystectomy and her triglycerides were normal Encouraged wt loss.  Hold off on statin for now.

## 2011-04-29 NOTE — Patient Instructions (Signed)
Please complete the following lab tests before your next follow up appointment: Lipid panel - 272.4 BMET 0 401.9

## 2011-04-29 NOTE — Assessment & Plan Note (Signed)
Well controlled.  Continue current medication regimen. Lab Results  Component Value Date   CREATININE 1.11* 04/12/2011

## 2011-04-29 NOTE — Assessment & Plan Note (Signed)
Very mild.  Her Cr on discharge 1.11.  Pt advised to avoid NSAIDs

## 2011-04-29 NOTE — Assessment & Plan Note (Signed)
Pt experienced debilitation from her recent hospitalization. This has resolved. Discontinue PT/OT

## 2011-04-29 NOTE — Assessment & Plan Note (Signed)
Pt counseled on diet and exercise

## 2011-05-10 DIAGNOSIS — N189 Chronic kidney disease, unspecified: Secondary | ICD-10-CM

## 2011-05-10 DIAGNOSIS — K859 Acute pancreatitis without necrosis or infection, unspecified: Secondary | ICD-10-CM

## 2011-05-10 DIAGNOSIS — R269 Unspecified abnormalities of gait and mobility: Secondary | ICD-10-CM

## 2011-05-10 DIAGNOSIS — I129 Hypertensive chronic kidney disease with stage 1 through stage 4 chronic kidney disease, or unspecified chronic kidney disease: Secondary | ICD-10-CM

## 2011-06-03 ENCOUNTER — Other Ambulatory Visit: Payer: Self-pay | Admitting: *Deleted

## 2011-06-03 DIAGNOSIS — K219 Gastro-esophageal reflux disease without esophagitis: Secondary | ICD-10-CM

## 2011-06-03 MED ORDER — CITALOPRAM HYDROBROMIDE 20 MG PO TABS: 20.0000 mg | ORAL_TABLET | Freq: Every day | ORAL | Status: DC

## 2011-06-03 MED ORDER — OMEPRAZOLE 40 MG PO CPDR: 40.0000 mg | DELAYED_RELEASE_CAPSULE | Freq: Every day | ORAL | Status: DC

## 2011-06-24 ENCOUNTER — Other Ambulatory Visit (INDEPENDENT_AMBULATORY_CARE_PROVIDER_SITE_OTHER): Payer: Medicare Other

## 2011-06-24 DIAGNOSIS — I10 Essential (primary) hypertension: Secondary | ICD-10-CM

## 2011-06-24 DIAGNOSIS — E785 Hyperlipidemia, unspecified: Secondary | ICD-10-CM

## 2011-06-24 LAB — LIPID PANEL
Cholesterol: 227 mg/dL — ABNORMAL HIGH (ref 0–200)
HDL: 48.3 mg/dL (ref >39.00)
Total CHOL/HDL Ratio: 5
Triglycerides: 173 mg/dL — ABNORMAL HIGH (ref 0.0–149.0)
VLDL: 34.6 mg/dL (ref 0.0–40.0)

## 2011-06-24 LAB — BASIC METABOLIC PANEL
CO2: 24 mEq/L (ref 19–32)
Calcium: 9.2 mg/dL (ref 8.4–10.5)
Creatinine, Ser: 1.7 mg/dL — ABNORMAL HIGH (ref 0.4–1.2)
GFR: 31.6 mL/min — ABNORMAL LOW (ref >60.00)

## 2011-06-24 LAB — LDL CHOLESTEROL, DIRECT: Direct LDL: 147.9 mg/dL

## 2011-07-08 ENCOUNTER — Encounter: Payer: Self-pay | Admitting: Internal Medicine

## 2011-07-08 ENCOUNTER — Ambulatory Visit (INDEPENDENT_AMBULATORY_CARE_PROVIDER_SITE_OTHER): Payer: Medicare Other | Admitting: Internal Medicine

## 2011-07-08 VITALS — BP 148/80 | HR 72 | Temp 97.8°F | Wt 219.0 lb

## 2011-07-08 DIAGNOSIS — N289 Disorder of kidney and ureter, unspecified: Secondary | ICD-10-CM

## 2011-07-08 DIAGNOSIS — N259 Disorder resulting from impaired renal tubular function, unspecified: Secondary | ICD-10-CM

## 2011-07-08 DIAGNOSIS — E785 Hyperlipidemia, unspecified: Secondary | ICD-10-CM

## 2011-07-08 DIAGNOSIS — G2581 Restless legs syndrome: Secondary | ICD-10-CM | POA: Insufficient documentation

## 2011-07-08 DIAGNOSIS — D649 Anemia, unspecified: Secondary | ICD-10-CM | POA: Insufficient documentation

## 2011-07-08 LAB — CBC WITH DIFFERENTIAL/PLATELET
Basophils Absolute: 0 10*3/uL (ref 0.0–0.1)
Eosinophils Absolute: 0.2 10*3/uL (ref 0.0–0.7)
Hemoglobin: 11.2 g/dL — ABNORMAL LOW (ref 12.0–15.0)
Lymphocytes Relative: 33.3 % (ref 12.0–46.0)
Lymphs Abs: 2.2 10*3/uL (ref 0.7–4.0)
MCHC: 33.6 g/dL (ref 30.0–36.0)
Neutro Abs: 3.8 10*3/uL (ref 1.4–7.7)
RDW: 13.4 % (ref 11.5–14.6)

## 2011-07-08 NOTE — Assessment & Plan Note (Signed)
Unclear etiology--- Check labs today (now off lisinopril) She has seen nephrology

## 2011-07-08 NOTE — Assessment & Plan Note (Signed)
Check iron studies.  

## 2011-07-08 NOTE — Assessment & Plan Note (Signed)
Reviewed labs---no reason to treat

## 2011-07-09 LAB — BASIC METABOLIC PANEL
Calcium: 9.4 mg/dL (ref 8.4–10.5)
GFR: 34.19 mL/min — ABNORMAL LOW (ref >60.00)
Glucose, Bld: 102 mg/dL — ABNORMAL HIGH (ref 70–99)
Sodium: 143 mEq/L (ref 135–145)

## 2011-07-09 LAB — IBC PANEL: Transferrin: 303.7 mg/dL (ref 212.0–360.0)

## 2011-07-11 NOTE — Progress Notes (Signed)
Patient ID: Linda Bentley, female   DOB: [birth year], 77 y.o.   MRN: [id number] Patient comes in for followup of multiple medical problems. Patient with known hypertension. She is tolerating her medications without difficulty. She has not fallen and exercise her diet plan.  Osteoarthritis of the knee. She is followed by orthopedics. She states that her knee pain is to the point where she can no longer exercise.  Renal insufficiency patient needs follow up.  Past Medical History  Diagnosis Date  . Depression   . GERD (gastroesophageal reflux disease)   . Hyperlipidemia   . Hypertension   . Osteopenia   . Renal insufficiency 2007    creat 1.3  . Hiatal hernia   . LBP (low back pain)   . UNSPECIFIED PERIPHERAL VASCULAR DISEASE 06/08/2010  . ANEMIA-UNSPECIFIED 07/03/2009  . HYPERGLYCEMIA 02/26/2007  . GERD 02/26/2007    History   Social History  . Marital Status: Widowed    Spouse Name: N/A    Number of Children: N/A  . Years of Education: N/A   Occupational History  . Not on file.   Social History Main Topics  . Smoking status: Never Smoker   . Smokeless tobacco: Not on file  . Alcohol Use: No  . Drug Use: No  . Sexually Active:    Other Topics Concern  . Not on file   Social History Narrative  . No narrative on file    Past Surgical History  Procedure Date  . Appendectomy 1962  . Cholecystectomy   . Oophorectomy 1962  . Parathyroidectomy   . Dilation and curettage of uterus 1962  . Wrist fracture surgery 10/01  . Tail bone surgery   . Esophagogastroduodenoscopy     with dilatation Dr Russella Dar  . Tonsillectomy and adenoidectomy   . Ingrown toenails      x3    Family History  Problem Relation Age of Onset  . Kidney disease Father     failure  . Colon cancer Sister     Allergies  Allergen Reactions  . Diltiazem Hcl     REACTION: feeling drunk    Current Outpatient Prescriptions on File Prior to Visit  Medication Sig Dispense Refill  . acetaminophen  (TYLENOL) 650 MG CR tablet Take 650 mg by mouth every 8 (eight) hours as needed.        Marland Kitchen aspirin 81 MG tablet Take 81 mg by mouth daily.        . Calcium Carbonate-Vit D-Min (CALTRATE 600+D PLUS) 600-400 MG-UNIT per tablet Chew 1 tablet by mouth daily.        . citalopram (CELEXA) 20 MG tablet Take 1 tablet (20 mg total) by mouth daily.  90 tablet  3  . fish oil-omega-3 fatty acids 1000 MG capsule Take 2 g by mouth daily.        . furosemide (LASIX) 20 MG tablet Take 1 tablet (20 mg total) by mouth 2 (two) times daily.  90 tablet  1  . labetalol (NORMODYNE) 100 MG tablet Take 1 tablet (100 mg total) by mouth at bedtime as needed.  90 tablet  1  . LORazepam (ATIVAN) 0.5 MG tablet Take 1 tablet (0.5 mg total) by mouth daily as needed for anxiety.  30 tablet  2  . Multiple Vitamin (MULTIVITAMIN) tablet Take 1 tablet by mouth daily.        Marland Kitchen omeprazole (PRILOSEC) 40 MG capsule Take 1 capsule (40 mg total) by mouth daily.  90 capsule  3     patient denies chest pain, shortness of breath, orthopnea. Denies lower extremity edema, abdominal pain, change in appetite, change in bowel movements. Patient denies rashes, musculoskeletal complaints. No other specific complaints in a complete review of systems.   BP 148/80  Pulse 72  Temp(Src) 97.8 F (36.6 C) (Oral)  Wt 219 lb (99.338 kg) Obese female in no acute distress. HEENT exam atraumatic, normocephalic, neck supple. Chest clear to auscultation cardiac exam S1-S2 irregular. Abdominal exam obese, pleasant, soft her extremities ankles and calves are quite large but there is no pitting edema.

## 2011-08-09 DIAGNOSIS — M1710 Unilateral primary osteoarthritis, unspecified knee: Secondary | ICD-10-CM | POA: Diagnosis not present

## 2011-08-13 DIAGNOSIS — D3130 Benign neoplasm of unspecified choroid: Secondary | ICD-10-CM | POA: Diagnosis not present

## 2011-08-13 DIAGNOSIS — H04129 Dry eye syndrome of unspecified lacrimal gland: Secondary | ICD-10-CM | POA: Diagnosis not present

## 2011-08-21 ENCOUNTER — Ambulatory Visit (INDEPENDENT_AMBULATORY_CARE_PROVIDER_SITE_OTHER): Payer: Medicare Other | Admitting: Internal Medicine

## 2011-08-21 ENCOUNTER — Encounter: Payer: Self-pay | Admitting: Internal Medicine

## 2011-08-21 VITALS — BP 132/64 | HR 88 | Temp 98.1°F | Wt 215.0 lb

## 2011-08-21 DIAGNOSIS — J069 Acute upper respiratory infection, unspecified: Secondary | ICD-10-CM | POA: Insufficient documentation

## 2011-08-21 NOTE — Assessment & Plan Note (Signed)
78 year old white female with symptoms of viral URI. We discussed symptomatic treatment.  Patient advised to call office if symptoms persist or worsen.

## 2011-08-21 NOTE — Patient Instructions (Signed)
You can use tylenol 650 mg every 8 hrs for aches and pains Gargle with warm saltwater and use nasal saline as directed He can use Mucinex twice daily as needed Please call our office if your symptoms do not improve or gets worse.

## 2011-08-21 NOTE — Progress Notes (Signed)
Subjective:    Patient ID: Linda Bentley, female    DOB: 1928-10-04, 78 y.o.   MRN: [id number]  URI  This is a new problem. The current episode started in the past 7 days. The problem has been unchanged. There has been no fever. Associated symptoms include congestion, coughing and a sore throat. She has tried acetaminophen for the symptoms. The treatment provided no relief.   She complains of intermittent chills.    Review of Systems  HENT: Positive for congestion and sore throat.   Respiratory: Positive for cough.   negative for shortness of breath  Past Medical History  Diagnosis Date  . Depression   . GERD (gastroesophageal reflux disease)   . Hyperlipidemia   . Hypertension   . Osteopenia   . Renal insufficiency 2007    creat 1.3  . Hiatal hernia   . LBP (low back pain)   . UNSPECIFIED PERIPHERAL VASCULAR DISEASE 06/08/2010  . ANEMIA-UNSPECIFIED 07/03/2009  . HYPERGLYCEMIA 02/26/2007  . GERD 02/26/2007    History   Social History  . Marital Status: Widowed    Spouse Name: N/A    Number of Children: N/A  . Years of Education: N/A   Occupational History  . Not on file.   Social History Main Topics  . Smoking status: Never Smoker   . Smokeless tobacco: Not on file  . Alcohol Use: No  . Drug Use: No  . Sexually Active:    Other Topics Concern  . Not on file   Social History Narrative  . No narrative on file    Past Surgical History  Procedure Date  . Appendectomy 1962  . Cholecystectomy   . Oophorectomy 1962  . Parathyroidectomy   . Dilation and curettage of uterus 1962  . Wrist fracture surgery 10/01  . Tail bone surgery   . Esophagogastroduodenoscopy     with dilatation Dr Russella Dar  . Tonsillectomy and adenoidectomy   . Ingrown toenails      x3    Family History  Problem Relation Age of Onset  . Kidney disease Father     failure  . Colon cancer Sister     Allergies  Allergen Reactions  . Diltiazem Hcl     REACTION: feeling drunk     Current Outpatient Prescriptions on File Prior to Visit  Medication Sig Dispense Refill  . acetaminophen (TYLENOL) 650 MG CR tablet Take 650 mg by mouth every 8 (eight) hours as needed.        Marland Kitchen aspirin 81 MG tablet Take 81 mg by mouth daily.        . Calcium Carbonate-Vit D-Min (CALTRATE 600+D PLUS) 600-400 MG-UNIT per tablet Chew 1 tablet by mouth daily.        . citalopram (CELEXA) 20 MG tablet Take 1 tablet (20 mg total) by mouth daily.  90 tablet  3  . fish oil-omega-3 fatty acids 1000 MG capsule Take 2 g by mouth daily.        . furosemide (LASIX) 20 MG tablet Take 1 tablet (20 mg total) by mouth 2 (two) times daily.  90 tablet  1  . labetalol (NORMODYNE) 100 MG tablet Take 1 tablet (100 mg total) by mouth at bedtime as needed.  90 tablet  1  . LORazepam (ATIVAN) 0.5 MG tablet Take 1 tablet (0.5 mg total) by mouth daily as needed for anxiety.  30 tablet  2  . Multiple Vitamin (MULTIVITAMIN) tablet Take 1 tablet by mouth daily.        Marland Kitchen  omeprazole (PRILOSEC) 40 MG capsule Take 1 capsule (40 mg total) by mouth daily.  90 capsule  3    BP 132/64  Pulse 88  Temp(Src) 98.1 F (36.7 C) (Oral)  Wt 215 lb (97.523 kg)  SpO2 96%       Objective:   Physical Exam  Constitutional: She appears well-developed and well-nourished.  HENT:  Head: Normocephalic and atraumatic.  Right Ear: External ear normal.       Left TM slightly retracted  Neck: Neck supple.  Cardiovascular: Normal rate, regular rhythm and normal heart sounds.   Pulmonary/Chest: Effort normal and breath sounds normal. No respiratory distress. She has no wheezes. She has no rales.  Lymphadenopathy:    She has no cervical adenopathy.          Assessment & Plan:

## 2011-09-02 DIAGNOSIS — M1710 Unilateral primary osteoarthritis, unspecified knee: Secondary | ICD-10-CM | POA: Diagnosis not present

## 2011-09-09 DIAGNOSIS — M1710 Unilateral primary osteoarthritis, unspecified knee: Secondary | ICD-10-CM | POA: Diagnosis not present

## 2011-09-11 ENCOUNTER — Other Ambulatory Visit: Payer: Self-pay | Admitting: *Deleted

## 2011-09-11 MED ORDER — LABETALOL HCL 100 MG PO TABS: 100.0000 mg | ORAL_TABLET | Freq: Every evening | ORAL | Status: DC | PRN

## 2011-09-16 DIAGNOSIS — M1710 Unilateral primary osteoarthritis, unspecified knee: Secondary | ICD-10-CM | POA: Diagnosis not present

## 2011-09-23 ENCOUNTER — Other Ambulatory Visit: Payer: Self-pay | Admitting: Internal Medicine

## 2011-09-23 DIAGNOSIS — Z1231 Encounter for screening mammogram for malignant neoplasm of breast: Secondary | ICD-10-CM

## 2011-10-02 DIAGNOSIS — R490 Dysphonia: Secondary | ICD-10-CM | POA: Diagnosis not present

## 2011-10-02 DIAGNOSIS — J310 Chronic rhinitis: Secondary | ICD-10-CM | POA: Diagnosis not present

## 2011-10-04 ENCOUNTER — Other Ambulatory Visit: Payer: Self-pay | Admitting: *Deleted

## 2011-10-04 MED ORDER — FUROSEMIDE 20 MG PO TABS: 20.0000 mg | ORAL_TABLET | Freq: Two times a day (BID) | ORAL | Status: DC

## 2011-10-29 ENCOUNTER — Ambulatory Visit: Payer: Medicare Other

## 2011-11-01 ENCOUNTER — Ambulatory Visit: Payer: Medicare Other

## 2011-11-08 ENCOUNTER — Telehealth: Payer: Self-pay | Admitting: Internal Medicine

## 2011-11-08 MED ORDER — LORAZEPAM 0.5 MG PO TABS: 0.5000 mg | ORAL_TABLET | Freq: Every day | ORAL | Status: DC | PRN

## 2011-11-08 NOTE — Telephone Encounter (Signed)
rx faxed to pharmacy

## 2011-11-08 NOTE — Telephone Encounter (Signed)
Pt called her pharmacy on Wed 11/06/11 to req refill of LORazepam (ATIVAN) 0.5 MG tablet, but nothing has been called in yet and pt is completely out of med. Pt is req that this be called in today to [facility] - [place], [place] - [address]. Pt also would like a call back from nurse today. Pt is aware that pcp is out of the office.

## 2011-11-12 DIAGNOSIS — H04129 Dry eye syndrome of unspecified lacrimal gland: Secondary | ICD-10-CM | POA: Diagnosis not present

## 2011-11-14 ENCOUNTER — Encounter: Payer: Self-pay | Admitting: *Deleted

## 2011-11-19 ENCOUNTER — Ambulatory Visit
Admission: RE | Admit: 2011-11-19 | Discharge: 2011-11-19 | Disposition: A | Discharge status: Home / Self Care | Payer: Medicare Other | Source: Ambulatory Visit | Attending: Internal Medicine | Admitting: Internal Medicine

## 2011-11-19 DIAGNOSIS — Z1231 Encounter for screening mammogram for malignant neoplasm of breast: Secondary | ICD-10-CM | POA: Diagnosis not present

## 2011-11-20 ENCOUNTER — Other Ambulatory Visit: Payer: Self-pay | Admitting: Internal Medicine

## 2011-11-20 DIAGNOSIS — R928 Other abnormal and inconclusive findings on diagnostic imaging of breast: Secondary | ICD-10-CM

## 2011-11-26 ENCOUNTER — Ambulatory Visit
Admission: RE | Admit: 2011-11-26 | Discharge: 2011-11-26 | Disposition: A | Discharge status: Home / Self Care | Payer: Medicare Other | Source: Ambulatory Visit | Attending: Internal Medicine | Admitting: Internal Medicine

## 2011-11-26 DIAGNOSIS — R928 Other abnormal and inconclusive findings on diagnostic imaging of breast: Secondary | ICD-10-CM

## 2011-12-24 DIAGNOSIS — R609 Edema, unspecified: Secondary | ICD-10-CM | POA: Diagnosis not present

## 2011-12-24 DIAGNOSIS — I129 Hypertensive chronic kidney disease with stage 1 through stage 4 chronic kidney disease, or unspecified chronic kidney disease: Secondary | ICD-10-CM | POA: Diagnosis not present

## 2011-12-24 DIAGNOSIS — N179 Acute kidney failure, unspecified: Secondary | ICD-10-CM | POA: Diagnosis not present

## 2011-12-31 ENCOUNTER — Ambulatory Visit (INDEPENDENT_AMBULATORY_CARE_PROVIDER_SITE_OTHER): Payer: Medicare Other | Admitting: Internal Medicine

## 2011-12-31 ENCOUNTER — Encounter: Payer: Self-pay | Admitting: Internal Medicine

## 2011-12-31 VITALS — BP 132/76 | HR 80 | Temp 98.3°F | Ht 60.25 in | Wt 221.0 lb

## 2011-12-31 DIAGNOSIS — E785 Hyperlipidemia, unspecified: Secondary | ICD-10-CM | POA: Diagnosis not present

## 2011-12-31 DIAGNOSIS — I10 Essential (primary) hypertension: Secondary | ICD-10-CM

## 2011-12-31 DIAGNOSIS — N259 Disorder resulting from impaired renal tubular function, unspecified: Secondary | ICD-10-CM | POA: Diagnosis not present

## 2011-12-31 LAB — POCT URINALYSIS DIPSTICK
Leukocytes, UA: NEGATIVE
Nitrite, UA: NEGATIVE
pH, UA: >=9

## 2011-12-31 LAB — BASIC METABOLIC PANEL
Calcium: 9.2 mg/dL (ref 8.4–10.5)
Creatinine, Ser: 1.7 mg/dL — ABNORMAL HIGH (ref 0.4–1.2)
GFR: 31.34 mL/min — ABNORMAL LOW (ref >60.00)

## 2011-12-31 LAB — HEPATIC FUNCTION PANEL
ALT: 13 U/L (ref 0–35)
AST: 20 U/L (ref 0–37)
Albumin: 3.8 g/dL (ref 3.5–5.2)

## 2011-12-31 LAB — LIPID PANEL
Cholesterol: 221 mg/dL — ABNORMAL HIGH (ref 0–200)
Triglycerides: 219 mg/dL — ABNORMAL HIGH (ref 0.0–149.0)

## 2011-12-31 NOTE — Progress Notes (Signed)
Patient ID: Linda Bentley, female   DOB: [birth year], 78 y.o.   MRN: [id number] F/u  htn-- home bps 130s/70s  Weight--- she is trying to follow a diet. She is not exercising  Renal insufficiency-- followed by nephrology  Past Medical History  Diagnosis Date  . Depression   . GERD (gastroesophageal reflux disease)   . Hyperlipidemia   . Hypertension   . Osteopenia   . Renal insufficiency 2007    creat 1.3  . Hiatal hernia   . LBP (low back pain)   . UNSPECIFIED PERIPHERAL VASCULAR DISEASE 06/08/2010  . ANEMIA-UNSPECIFIED 07/03/2009  . HYPERGLYCEMIA 02/26/2007  . GERD 02/26/2007    History   Social History  . Marital Status: Widowed    Spouse Name: N/A    Number of Children: N/A  . Years of Education: N/A   Occupational History  . Not on file.   Social History Main Topics  . Smoking status: Never Smoker   . Smokeless tobacco: Not on file  . Alcohol Use: No  . Drug Use: No  . Sexually Active:    Other Topics Concern  . Not on file   Social History Narrative  . No narrative on file    Past Surgical History  Procedure Date  . Appendectomy 1962  . Cholecystectomy   . Oophorectomy 1962  . Parathyroidectomy   . Dilation and curettage of uterus 1962  . Wrist fracture surgery 10/01  . Tail bone surgery   . Esophagogastroduodenoscopy     with dilatation Dr Russella Dar  . Tonsillectomy and adenoidectomy   . Ingrown toenails      x3    Family History  Problem Relation Age of Onset  . Kidney disease Father     failure  . Colon cancer Sister     Allergies  Allergen Reactions  . Diltiazem Hcl     REACTION: feeling drunk    Current Outpatient Prescriptions on File Prior to Visit  Medication Sig Dispense Refill  . acetaminophen (TYLENOL) 650 MG CR tablet Take 650 mg by mouth every 8 (eight) hours as needed.        Marland Kitchen aspirin 81 MG tablet Take 81 mg by mouth daily.        . Calcium Carbonate-Vit D-Min (CALTRATE 600+D PLUS) 600-400 MG-UNIT per tablet Chew 1 tablet  by mouth daily.        . citalopram (CELEXA) 20 MG tablet Take 1 tablet (20 mg total) by mouth daily.  90 tablet  3  . fish oil-omega-3 fatty acids 1000 MG capsule Take 2 g by mouth daily.        . furosemide (LASIX) 20 MG tablet Take 1 tablet (20 mg total) by mouth 2 (two) times daily.  90 tablet  1  . labetalol (NORMODYNE) 100 MG tablet Take 1 tablet (100 mg total) by mouth at bedtime as needed.  90 tablet  1  . LORazepam (ATIVAN) 0.5 MG tablet Take 1 tablet (0.5 mg total) by mouth daily as needed for anxiety.  30 tablet  2  . Multiple Vitamin (MULTIVITAMIN) tablet Take 1 tablet by mouth daily.        Marland Kitchen omeprazole (PRILOSEC) 40 MG capsule Take 1 capsule (40 mg total) by mouth daily.  90 capsule  3     patient denies chest pain, shortness of breath, orthopnea.she has chronic LE edema (stable), She is not exercising Has seen Ortho about bilateral knee OA  BP 132/76  Pulse 80  Temp(Src)  98.3 F (36.8 C) (Oral)  Ht 5' 0.25" (1.53 m)  Wt 221 lb (100.245 kg)  BMI 42.80 kg/m2

## 2011-12-31 NOTE — Assessment & Plan Note (Signed)
Currently on no meds Check labs today

## 2011-12-31 NOTE — Assessment & Plan Note (Signed)
Is being followed by dr Kathrene Bongo Check labs today

## 2011-12-31 NOTE — Assessment & Plan Note (Signed)
Home bps are in the 130-140/70 range She desperately needs to lose weight. Continue same meds for now

## 2012-01-06 ENCOUNTER — Other Ambulatory Visit: Payer: Self-pay | Admitting: *Deleted

## 2012-01-06 MED ORDER — ATORVASTATIN CALCIUM 40 MG PO TABS: 40.0000 mg | ORAL_TABLET | Freq: Every day | ORAL | Status: DC

## 2012-03-09 ENCOUNTER — Other Ambulatory Visit: Payer: Self-pay | Admitting: Internal Medicine

## 2012-04-01 ENCOUNTER — Encounter: Payer: Self-pay | Admitting: Internal Medicine

## 2012-04-01 ENCOUNTER — Ambulatory Visit (INDEPENDENT_AMBULATORY_CARE_PROVIDER_SITE_OTHER): Payer: Medicare Other | Admitting: Internal Medicine

## 2012-04-01 VITALS — BP 124/56 | Temp 97.9°F | Wt 209.0 lb

## 2012-04-01 DIAGNOSIS — R42 Dizziness and giddiness: Secondary | ICD-10-CM

## 2012-04-01 DIAGNOSIS — R32 Unspecified urinary incontinence: Secondary | ICD-10-CM | POA: Diagnosis not present

## 2012-04-01 DIAGNOSIS — N390 Urinary tract infection, site not specified: Secondary | ICD-10-CM | POA: Insufficient documentation

## 2012-04-01 DIAGNOSIS — M549 Dorsalgia, unspecified: Secondary | ICD-10-CM | POA: Insufficient documentation

## 2012-04-01 DIAGNOSIS — R002 Palpitations: Secondary | ICD-10-CM | POA: Diagnosis not present

## 2012-04-01 DIAGNOSIS — I4949 Other premature depolarization: Secondary | ICD-10-CM

## 2012-04-01 DIAGNOSIS — I493 Ventricular premature depolarization: Secondary | ICD-10-CM

## 2012-04-01 DIAGNOSIS — I10 Essential (primary) hypertension: Secondary | ICD-10-CM

## 2012-04-01 LAB — POCT URINALYSIS DIPSTICK
Glucose, UA: NEGATIVE
Protein, UA: NEGATIVE
Spec Grav, UA: 1.01
Urobilinogen, UA: 0.2

## 2012-04-01 MED ORDER — CEFUROXIME AXETIL 250 MG PO TABS: 250.0000 mg | ORAL_TABLET | Freq: Two times a day (BID) | ORAL | Status: AC

## 2012-04-01 MED ORDER — MECLIZINE HCL 12.5 MG PO TABS: 12.5000 mg | ORAL_TABLET | Freq: Three times a day (TID) | ORAL | Status: AC | PRN

## 2012-04-01 NOTE — Assessment & Plan Note (Signed)
78 year old white female with left-sided thoracolumbar back pain. Unclear whether her symptoms related to her urinary tract infection versus spondylosis of her spine. Patient advised to take acetaminophen 650 mg every 8 hours as needed for now. I would like to avoid using any narcotics considering her current issues with dizziness.

## 2012-04-01 NOTE — Assessment & Plan Note (Signed)
Treat with cefuroxime 250 mg twice daily x 7 days.  Consider urologic evaluation re: chronic incontinence

## 2012-04-01 NOTE — Progress Notes (Signed)
Subjective:    Patient ID: Linda Bentley, female    DOB: 10/25/1928, 78 y.o.   MRN: [id number]  HPI  78 year old white female with history of hypertension, renal insufficiency, and depression presents with chronic dizziness for the last 2 or 3 months. Patient reports feeling unsteady/"trunk". She also has discomfort behind her left ear. She denies any hearing loss. She denies fever or chills.  She also complains of left-sided back pain. Symptoms do not change whether she is sitting standing or laying. She has history of chronic urinary incontinence.  Patient also complains of intermittent palpitations. She often feels like she needs to a deep breath when palpitations occur.   Review of Systems Negative for chest pain Negative for syncope  Past Medical History  Diagnosis Date  . Depression   . GERD (gastroesophageal reflux disease)   . Hyperlipidemia   . Hypertension   . Osteopenia   . Renal insufficiency 2007    creat 1.3  . Hiatal hernia   . LBP (low back pain)   . UNSPECIFIED PERIPHERAL VASCULAR DISEASE 06/08/2010  . ANEMIA-UNSPECIFIED 07/03/2009  . HYPERGLYCEMIA 02/26/2007  . GERD 02/26/2007    History   Social History  . Marital Status: Widowed    Spouse Name: N/A    Number of Children: N/A  . Years of Education: N/A   Occupational History  . Not on file.   Social History Main Topics  . Smoking status: Never Smoker   . Smokeless tobacco: Not on file  . Alcohol Use: No  . Drug Use: No  . Sexually Active:    Other Topics Concern  . Not on file   Social History Narrative  . No narrative on file    Past Surgical History  Procedure Date  . Appendectomy 1962  . Cholecystectomy   . Oophorectomy 1962  . Parathyroidectomy   . Dilation and curettage of uterus 1962  . Wrist fracture surgery 10/01  . Tail bone surgery   . Esophagogastroduodenoscopy     with dilatation Dr Russella Dar  . Tonsillectomy and adenoidectomy   . Ingrown toenails      x3    Family  History  Problem Relation Age of Onset  . Kidney disease Father     failure  . Colon cancer Sister     Allergies  Allergen Reactions  . Diltiazem Hcl     REACTION: feeling drunk    Current Outpatient Prescriptions on File Prior to Visit  Medication Sig Dispense Refill  . acetaminophen (TYLENOL) 650 MG CR tablet Take 650 mg by mouth every 8 (eight) hours as needed.        Marland Kitchen aspirin 81 MG tablet Take 81 mg by mouth daily.        Marland Kitchen atorvastatin (LIPITOR) 40 MG tablet Take 1 tablet (40 mg total) by mouth daily.  30 tablet  5  . Calcium Carbonate-Vit D-Min (CALTRATE 600+D PLUS) 600-400 MG-UNIT per tablet Chew 1 tablet by mouth daily.        . citalopram (CELEXA) 20 MG tablet Take 1 tablet (20 mg total) by mouth daily.  90 tablet  3  . fish oil-omega-3 fatty acids 1000 MG capsule Take 2 g by mouth daily.        . furosemide (LASIX) 20 MG tablet TAKE 1 TABLET BY MOUTH 2 TIMES DAILY.  90 tablet  0  . labetalol (NORMODYNE) 100 MG tablet TAKE 1 TABLET BY MOUTH AT BEDTIME AS NEEDED.  90 tablet  0  .  LORazepam (ATIVAN) 0.5 MG tablet Take 1 tablet (0.5 mg total) by mouth daily as needed for anxiety.  30 tablet  2  . Multiple Vitamin (MULTIVITAMIN) tablet Take 1 tablet by mouth daily.        Marland Kitchen omeprazole (PRILOSEC) 40 MG capsule Take 1 capsule (40 mg total) by mouth daily.  90 capsule  3    BP 124/56  Temp 97.9 F (36.6 C) (Oral)  Wt 209 lb (94.802 kg)       Objective:   Physical Exam  Constitutional: She is oriented to person, place, and time. She appears well-developed and well-nourished.  HENT:  Head: Normocephalic and atraumatic.  Right Ear: External ear normal.  Left Ear: External ear normal.       No tenderness of left mastoid  Neck: Neck supple.  Cardiovascular: Normal rate and regular rhythm.        Occasional ectopy  Pulmonary/Chest: Effort normal and breath sounds normal. She has no wheezes.  Abdominal: Soft. Bowel sounds are normal. There is no tenderness.    Lymphadenopathy:    She has no cervical adenopathy.  Neurological: She is alert and oriented to person, place, and time.  Skin: Skin is warm and dry.          Assessment & Plan:

## 2012-04-01 NOTE — Assessment & Plan Note (Addendum)
78 year old white female complains of chronic dizziness for the last 2-3 months. She complains of intermittent palpitations. EKG shows normal sinus rhythm at 77 beats per minute. She has occasional PVCs. Obtain electrolytes and magnesium level. Continue labetalol for now. I doubt her PVCs are source of her dizziness.  She does not have orthostatic hypotension.  Some of her symptoms provoked by laying patient back supine while looking left. Question positional vertigo.  Trial of meclizine.

## 2012-04-01 NOTE — Assessment & Plan Note (Signed)
Stable. Monitor electrolytes. Furosemide dose increased by Dr. Cato Mulligan.

## 2012-04-02 ENCOUNTER — Other Ambulatory Visit: Payer: Self-pay | Admitting: Internal Medicine

## 2012-04-02 DIAGNOSIS — N289 Disorder of kidney and ureter, unspecified: Secondary | ICD-10-CM

## 2012-04-02 LAB — BASIC METABOLIC PANEL
BUN: 48 mg/dL — ABNORMAL HIGH (ref 6–23)
CO2: 25 mEq/L (ref 19–32)
GFR: 27.15 mL/min — ABNORMAL LOW (ref >60.00)
Glucose, Bld: 102 mg/dL — ABNORMAL HIGH (ref 70–99)
Potassium: 4.3 mEq/L (ref 3.5–5.1)

## 2012-04-02 LAB — CBC WITH DIFFERENTIAL/PLATELET
Basophils Absolute: 0 10*3/uL (ref 0.0–0.1)
Eosinophils Absolute: 0.4 10*3/uL (ref 0.0–0.7)
Hemoglobin: 11.7 g/dL — ABNORMAL LOW (ref 12.0–15.0)
Lymphocytes Relative: 30.2 % (ref 12.0–46.0)
Lymphs Abs: 2.4 10*3/uL (ref 0.7–4.0)
MCHC: 32.2 g/dL (ref 30.0–36.0)
MCV: 89.7 fl (ref 78.0–100.0)
Monocytes Absolute: 0.5 10*3/uL (ref 0.1–1.0)
Neutro Abs: 4.5 10*3/uL (ref 1.4–7.7)
RDW: 14.1 % (ref 11.5–14.6)

## 2012-04-08 ENCOUNTER — Ambulatory Visit (INDEPENDENT_AMBULATORY_CARE_PROVIDER_SITE_OTHER): Payer: Medicare Other | Admitting: Internal Medicine

## 2012-04-08 ENCOUNTER — Other Ambulatory Visit: Payer: Self-pay | Admitting: *Deleted

## 2012-04-08 ENCOUNTER — Encounter: Payer: Self-pay | Admitting: Internal Medicine

## 2012-04-08 VITALS — BP 136/84 | Temp 98.4°F | Wt 209.0 lb

## 2012-04-08 DIAGNOSIS — R32 Unspecified urinary incontinence: Secondary | ICD-10-CM | POA: Insufficient documentation

## 2012-04-08 DIAGNOSIS — R42 Dizziness and giddiness: Secondary | ICD-10-CM | POA: Diagnosis not present

## 2012-04-08 DIAGNOSIS — N259 Disorder resulting from impaired renal tubular function, unspecified: Secondary | ICD-10-CM

## 2012-04-08 DIAGNOSIS — N289 Disorder of kidney and ureter, unspecified: Secondary | ICD-10-CM

## 2012-04-08 DIAGNOSIS — R3915 Urgency of urination: Secondary | ICD-10-CM | POA: Diagnosis not present

## 2012-04-08 DIAGNOSIS — M549 Dorsalgia, unspecified: Secondary | ICD-10-CM

## 2012-04-08 LAB — BASIC METABOLIC PANEL
BUN: 34 mg/dL — ABNORMAL HIGH (ref 6–23)
GFR: 42.95 mL/min — ABNORMAL LOW (ref >60.00)
Glucose, Bld: 105 mg/dL — ABNORMAL HIGH (ref 70–99)
Potassium: 4.8 mEq/L (ref 3.5–5.1)

## 2012-04-08 MED ORDER — LORAZEPAM 0.5 MG PO TABS: 0.5000 mg | ORAL_TABLET | Freq: Every day | ORAL | Status: DC | PRN

## 2012-04-08 NOTE — Assessment & Plan Note (Signed)
I suspect low back pain from spondylosis of thoracolumbar spine. Continue using acetaminophen as needed for now.

## 2012-04-08 NOTE — Progress Notes (Signed)
Subjective:    Patient ID: Linda Bentley, female    DOB: 04-Sep-1928, 78 y.o.   MRN: [id number]  HPI  78 year old white female previously seen for chronic dizziness, possible UTI and intermittent palpitations for followup. Patient treated with cefuroxime 250 mg twice daily for 7 days. Patient reports feeling much better. Her dizziness has also improved since using meclizine.  Blood tests reviewed.  She has worsening renal function.  Cr of 1.9.   Her Lasix has been on hold. Renal ultrasound was ordered but has not been completed.  She continues to struggle with chronic urinary incontinence.  She also complains of urinary urgency.  Review of Systems No fever or chills  Past Medical History  Diagnosis Date  . Depression   . GERD (gastroesophageal reflux disease)   . Hyperlipidemia   . Hypertension   . Osteopenia   . Renal insufficiency 2007    creat 1.3  . Hiatal hernia   . LBP (low back pain)   . UNSPECIFIED PERIPHERAL VASCULAR DISEASE 06/08/2010  . ANEMIA-UNSPECIFIED 07/03/2009  . HYPERGLYCEMIA 02/26/2007  . GERD 02/26/2007    History   Social History  . Marital Status: Widowed    Spouse Name: N/A    Number of Children: N/A  . Years of Education: N/A   Occupational History  . Not on file.   Social History Main Topics  . Smoking status: Never Smoker   . Smokeless tobacco: Not on file  . Alcohol Use: No  . Drug Use: No  . Sexually Active:    Other Topics Concern  . Not on file   Social History Narrative  . No narrative on file    Past Surgical History  Procedure Date  . Appendectomy 1962  . Cholecystectomy   . Oophorectomy 1962  . Parathyroidectomy   . Dilation and curettage of uterus 1962  . Wrist fracture surgery 10/01  . Tail bone surgery   . Esophagogastroduodenoscopy     with dilatation Dr Russella Dar  . Tonsillectomy and adenoidectomy   . Ingrown toenails      x3    Family History  Problem Relation Age of Onset  . Kidney disease Father    failure  . Colon cancer Sister     Allergies  Allergen Reactions  . Diltiazem Hcl     REACTION: feeling drunk    Current Outpatient Prescriptions on File Prior to Visit  Medication Sig Dispense Refill  . acetaminophen (TYLENOL) 650 MG CR tablet Take 650 mg by mouth every 8 (eight) hours as needed.        Marland Kitchen aspirin 81 MG tablet Take 81 mg by mouth daily.        Marland Kitchen atorvastatin (LIPITOR) 40 MG tablet Take 1 tablet (40 mg total) by mouth daily.  30 tablet  5  . Calcium Carbonate-Vit D-Min (CALTRATE 600+D PLUS) 600-400 MG-UNIT per tablet Chew 1 tablet by mouth daily.        . cefUROXime (CEFTIN) 250 MG tablet Take 1 tablet (250 mg total) by mouth 2 (two) times daily.  14 tablet  0  . citalopram (CELEXA) 20 MG tablet Take 1 tablet (20 mg total) by mouth daily.  90 tablet  3  . fish oil-omega-3 fatty acids 1000 MG capsule Take 2 g by mouth daily.        . furosemide (LASIX) 20 MG tablet TAKE 1 TABLET BY MOUTH 2 TIMES DAILY.  90 tablet  0  . labetalol (NORMODYNE) 100 MG tablet TAKE  1 TABLET BY MOUTH AT BEDTIME AS NEEDED.  90 tablet  0  . meclizine (ANTIVERT) 12.5 MG tablet Take 1 tablet (12.5 mg total) by mouth 3 (three) times daily as needed.  30 tablet  0  . Multiple Vitamin (MULTIVITAMIN) tablet Take 1 tablet by mouth daily.        Marland Kitchen omeprazole (PRILOSEC) 40 MG capsule Take 1 capsule (40 mg total) by mouth daily.  90 capsule  3    BP 136/84  Temp 98.4 F (36.9 C) (Oral)  Wt 209 lb (94.802 kg)       Objective:   Physical Exam  Constitutional: She is oriented to person, place, and time. She appears well-developed and well-nourished. No distress.  Cardiovascular: Normal rate, regular rhythm and normal heart sounds.   Pulmonary/Chest: Effort normal and breath sounds normal. She has no wheezes.  Neurological: She is alert and oriented to person, place, and time.  Skin: Skin is warm and dry.  Psychiatric: She has a normal mood and affect. Her behavior is normal.            Assessment & Plan:

## 2012-04-08 NOTE — Assessment & Plan Note (Signed)
Patient complains of chronic urinary incontinence. It is not limited to sneezing or laughing. She also has urinary urgency. Refer to urologist for further workup.

## 2012-04-08 NOTE — Assessment & Plan Note (Signed)
Improved.  Continue using meclizine as needed.

## 2012-04-08 NOTE — Patient Instructions (Addendum)
Please follow up with Dr. Cato Mulligan in December, 2013. Our office will contact you re: Urology referral

## 2012-04-08 NOTE — Assessment & Plan Note (Signed)
Cr worsening.  No clear etiology.   Hold lasix.  Obtain renal u/s.  Repeat BMET today.

## 2012-04-14 ENCOUNTER — Other Ambulatory Visit: Payer: Self-pay | Admitting: Cardiology

## 2012-04-14 DIAGNOSIS — N289 Disorder of kidney and ureter, unspecified: Secondary | ICD-10-CM

## 2012-04-20 ENCOUNTER — Ambulatory Visit (INDEPENDENT_AMBULATORY_CARE_PROVIDER_SITE_OTHER): Payer: Medicare Other | Admitting: Internal Medicine

## 2012-04-20 ENCOUNTER — Encounter (INDEPENDENT_AMBULATORY_CARE_PROVIDER_SITE_OTHER): Payer: Medicare Other

## 2012-04-20 ENCOUNTER — Encounter: Payer: Self-pay | Admitting: Internal Medicine

## 2012-04-20 VITALS — BP 124/62 | Temp 98.8°F | Wt 212.0 lb

## 2012-04-20 DIAGNOSIS — N289 Disorder of kidney and ureter, unspecified: Secondary | ICD-10-CM

## 2012-04-20 DIAGNOSIS — M549 Dorsalgia, unspecified: Secondary | ICD-10-CM | POA: Diagnosis not present

## 2012-04-20 DIAGNOSIS — N390 Urinary tract infection, site not specified: Secondary | ICD-10-CM

## 2012-04-20 DIAGNOSIS — N259 Disorder resulting from impaired renal tubular function, unspecified: Secondary | ICD-10-CM

## 2012-04-20 DIAGNOSIS — N189 Chronic kidney disease, unspecified: Secondary | ICD-10-CM

## 2012-04-20 LAB — POCT URINALYSIS DIPSTICK
Bilirubin, UA: NEGATIVE
Blood, UA: NEGATIVE
Nitrite, UA: NEGATIVE
pH, UA: 7.5

## 2012-04-20 MED ORDER — TRAMADOL HCL 50 MG PO TABS: ORAL_TABLET | ORAL | Status: DC

## 2012-04-20 NOTE — Assessment & Plan Note (Signed)
78 year old white female with history of renal insufficiency experiencing exacerbation of low back pain. Considering elevated creatinine, Avoid NSAIDs. Use tramadol 50 mg one half to one tablet every 8 hours as needed.  Her UA was normal but will send for culture.    Patient advised to call office if symptoms persist or worsen.  Reassess in 2 weeks.

## 2012-04-20 NOTE — Progress Notes (Signed)
Subjective:    Patient ID: Linda Bentley, female    DOB: [birth year], 78 y.o.   MRN: [id number]  HPI  78 year old white female with history of urinary incontinence, UTI and renal insufficiency complains of bilateral back pain for the last 3-4 days. Patient states "I feel like my back hurts like it did during my last UTI". However she denies any dysuria or foul odor to her urine.  Review of Systems Negative for fever or chills.    Past Medical History  Diagnosis Date  . Depression   . GERD (gastroesophageal reflux disease)   . Hyperlipidemia   . Hypertension   . Osteopenia   . Renal insufficiency 2007    creat 1.3  . Hiatal hernia   . LBP (low back pain)   . UNSPECIFIED PERIPHERAL VASCULAR DISEASE 06/08/2010  . ANEMIA-UNSPECIFIED 07/03/2009  . HYPERGLYCEMIA 02/26/2007  . GERD 02/26/2007    History   Social History  . Marital Status: Widowed    Spouse Name: N/A    Number of Children: N/A  . Years of Education: N/A   Occupational History  . Not on file.   Social History Main Topics  . Smoking status: Never Smoker   . Smokeless tobacco: Not on file  . Alcohol Use: No  . Drug Use: No  . Sexually Active:    Other Topics Concern  . Not on file   Social History Narrative  . No narrative on file    Past Surgical History  Procedure Date  . Appendectomy 1962  . Cholecystectomy   . Oophorectomy 1962  . Parathyroidectomy   . Dilation and curettage of uterus 1962  . Wrist fracture surgery 10/01  . Tail bone surgery   . Esophagogastroduodenoscopy     with dilatation Dr Russella Dar  . Tonsillectomy and adenoidectomy   . Ingrown toenails      x3    Family History  Problem Relation Age of Onset  . Kidney disease Father     failure  . Colon cancer Sister     Allergies  Allergen Reactions  . Diltiazem Hcl     REACTION: feeling drunk    Current Outpatient Prescriptions on File Prior to Visit  Medication Sig Dispense Refill  . acetaminophen (TYLENOL) 650 MG CR  tablet Take 650 mg by mouth every 8 (eight) hours as needed.        Marland Kitchen aspirin 81 MG tablet Take 81 mg by mouth daily.        Marland Kitchen atorvastatin (LIPITOR) 40 MG tablet Take 1 tablet (40 mg total) by mouth daily.  30 tablet  5  . Calcium Carbonate-Vit D-Min (CALTRATE 600+D PLUS) 600-400 MG-UNIT per tablet Chew 1 tablet by mouth daily.        . citalopram (CELEXA) 20 MG tablet Take 1 tablet (20 mg total) by mouth daily.  90 tablet  3  . fish oil-omega-3 fatty acids 1000 MG capsule Take 2 g by mouth daily.        . furosemide (LASIX) 20 MG tablet TAKE 1 TABLET BY MOUTH 2 TIMES DAILY.  90 tablet  0  . labetalol (NORMODYNE) 100 MG tablet TAKE 1 TABLET BY MOUTH AT BEDTIME AS NEEDED.  90 tablet  0  . LORazepam (ATIVAN) 0.5 MG tablet Take 1 tablet (0.5 mg total) by mouth daily as needed for anxiety.  30 tablet  2  . Multiple Vitamin (MULTIVITAMIN) tablet Take 1 tablet by mouth daily.        Marland Kitchen  omeprazole (PRILOSEC) 40 MG capsule Take 1 capsule (40 mg total) by mouth daily.  90 capsule  3    BP 124/62  Temp 98.8 F (37.1 C) (Oral)  Wt 212 lb (96.163 kg)     Objective:   Physical Exam  Constitutional: She is oriented to person, place, and time. She appears well-developed and well-nourished.  Cardiovascular: Normal rate, regular rhythm and normal heart sounds.   Pulmonary/Chest: Effort normal. She has no wheezes.  Abdominal: Soft. Bowel sounds are normal.       Mild lower abdominal tenderness No flank tenderness  Neurological: She is alert and oriented to person, place, and time.          Assessment & Plan:

## 2012-04-22 LAB — URINE CULTURE

## 2012-05-01 ENCOUNTER — Other Ambulatory Visit: Payer: Self-pay | Admitting: Internal Medicine

## 2012-05-01 DIAGNOSIS — N63 Unspecified lump in unspecified breast: Secondary | ICD-10-CM

## 2012-05-04 ENCOUNTER — Encounter: Payer: Self-pay | Admitting: Internal Medicine

## 2012-05-04 ENCOUNTER — Ambulatory Visit (INDEPENDENT_AMBULATORY_CARE_PROVIDER_SITE_OTHER): Payer: Medicare Other | Admitting: Internal Medicine

## 2012-05-04 VITALS — BP 134/72 | Temp 97.9°F | Wt 213.0 lb

## 2012-05-04 DIAGNOSIS — IMO0002 Reserved for concepts with insufficient information to code with codable children: Secondary | ICD-10-CM

## 2012-05-04 DIAGNOSIS — R42 Dizziness and giddiness: Secondary | ICD-10-CM

## 2012-05-04 DIAGNOSIS — H8110 Benign paroxysmal vertigo, unspecified ear: Secondary | ICD-10-CM | POA: Diagnosis not present

## 2012-05-04 DIAGNOSIS — I10 Essential (primary) hypertension: Secondary | ICD-10-CM

## 2012-05-04 DIAGNOSIS — M549 Dorsalgia, unspecified: Secondary | ICD-10-CM

## 2012-05-04 MED ORDER — LOSARTAN POTASSIUM 25 MG PO TABS: 25.0000 mg | ORAL_TABLET | Freq: Every day | ORAL | Status: DC

## 2012-05-04 MED ORDER — MECLIZINE HCL 12.5 MG PO TABS: 12.5000 mg | ORAL_TABLET | ORAL | Status: DC | PRN

## 2012-05-04 MED ORDER — TRAMADOL HCL 50 MG PO TABS: ORAL_TABLET | ORAL | Status: DC

## 2012-05-04 NOTE — Progress Notes (Signed)
Subjective:    Patient ID: Linda Bentley, female    DOB: 1928-11-01, 78 y.o.   MRN: [id number]  HPI  78 year old white female with history of chronic low back pain, hypertension and mild renal insufficiency for follow up. Patient reports her low back pain improved since previous visit. She is only using one half of tramadol 50 mg at bedtime and acetaminophen during the day. She reports long history of low back pain with this and was previously seen by Dr. Cleophas Dunker. MRI of lumbar spine was completed in 2011.  She denies adverse effects from tramadol.  Patient also complains of intermittent dizziness. Symptoms are worse in the morning after she showers. Symptoms triggered by changes in head position.  Htn - stable.  ACE inhibitor discontinued.  She sometimes feels "like I need to take a deeper breath".    Review of Systems Negative for chest pain  Past Medical History  Diagnosis Date  . Depression   . GERD (gastroesophageal reflux disease)   . Hyperlipidemia   . Hypertension   . Osteopenia   . Renal insufficiency 2007    creat 1.3  . Hiatal hernia   . LBP (low back pain)   . UNSPECIFIED PERIPHERAL VASCULAR DISEASE 06/08/2010  . ANEMIA-UNSPECIFIED 07/03/2009  . HYPERGLYCEMIA 02/26/2007  . GERD 02/26/2007    History   Social History  . Marital Status: Widowed    Spouse Name: N/A    Number of Children: N/A  . Years of Education: N/A   Occupational History  . Not on file.   Social History Main Topics  . Smoking status: Never Smoker   . Smokeless tobacco: Not on file  . Alcohol Use: No  . Drug Use: No  . Sexually Active:    Other Topics Concern  . Not on file   Social History Narrative  . No narrative on file    Past Surgical History  Procedure Date  . Appendectomy 1962  . Cholecystectomy   . Oophorectomy 1962  . Parathyroidectomy   . Dilation and curettage of uterus 1962  . Wrist fracture surgery 10/01  . Tail bone surgery   . Esophagogastroduodenoscopy       with dilatation Dr Russella Dar  . Tonsillectomy and adenoidectomy   . Ingrown toenails      x3    Family History  Problem Relation Age of Onset  . Kidney disease Father     failure  . Colon cancer Sister     Allergies  Allergen Reactions  . Diltiazem Hcl     REACTION: feeling drunk    Current Outpatient Prescriptions on File Prior to Visit  Medication Sig Dispense Refill  . acetaminophen (TYLENOL) 650 MG CR tablet Take 650 mg by mouth every 8 (eight) hours as needed.        Marland Kitchen aspirin 81 MG tablet Take 81 mg by mouth daily.        Marland Kitchen atorvastatin (LIPITOR) 40 MG tablet Take 1 tablet (40 mg total) by mouth daily.  30 tablet  5  . Calcium Carbonate-Vit D-Min (CALTRATE 600+D PLUS) 600-400 MG-UNIT per tablet Chew 1 tablet by mouth daily.        . citalopram (CELEXA) 20 MG tablet Take 1 tablet (20 mg total) by mouth daily.  90 tablet  3  . fish oil-omega-3 fatty acids 1000 MG capsule Take 2 g by mouth daily.        . furosemide (LASIX) 20 MG tablet TAKE 1 TABLET BY MOUTH 2  TIMES DAILY.  90 tablet  0  . labetalol (NORMODYNE) 100 MG tablet TAKE 1 TABLET BY MOUTH AT BEDTIME AS NEEDED.  90 tablet  0  . LORazepam (ATIVAN) 0.5 MG tablet Take 1 tablet (0.5 mg total) by mouth daily as needed for anxiety.  30 tablet  2  . Multiple Vitamin (MULTIVITAMIN) tablet Take 1 tablet by mouth daily.        Marland Kitchen omeprazole (PRILOSEC) 40 MG capsule Take 1 capsule (40 mg total) by mouth daily.  90 capsule  3  . losartan (COZAAR) 25 MG tablet Take 1 tablet (25 mg total) by mouth daily.  30 tablet  3    BP 134/72  Temp 97.9 F (36.6 C) (Oral)  Wt 213 lb (96.616 kg)       Objective:   Physical Exam  Constitutional: She appears well-developed and well-nourished.  Cardiovascular: Normal rate, regular rhythm and normal heart sounds.   No murmur heard. Pulmonary/Chest: Effort normal and breath sounds normal. She has no wheezes.  Musculoskeletal:       No pain in lumbar spine with percussion.  Neurological:  No cranial nerve deficit. Coordination normal.       Ambulates with single prong cane  Skin: Skin is warm and dry.  Psychiatric: She has a normal mood and affect. Her behavior is normal.          Assessment & Plan:

## 2012-05-04 NOTE — Assessment & Plan Note (Signed)
Patient has intermittent shortness of breath. This may be secondary to diastolic dysfunction. Patient previously on ACE inhibitor but discontinued secondary to renal insufficiency.  Start losartan 25 mg once daily. Arrange basic metabolic panel in 2 weeks.

## 2012-05-04 NOTE — Assessment & Plan Note (Signed)
Patient still has intermittent dizziness. Her symptoms are positional. 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 Refer to vestibular rehabilitation.

## 2012-05-04 NOTE — Patient Instructions (Addendum)
Please complete the following lab test within 2 weeks of starting new blood pressure medication: BMET - 401.9

## 2012-05-04 NOTE — Assessment & Plan Note (Signed)
I suspect patient's symptoms secondary to spondylosis of lumbar spine. She is tolerating low-dose tramadol well. She understands to use tramadol sparingly.

## 2012-05-05 ENCOUNTER — Ambulatory Visit: Payer: Medicare Other | Admitting: Internal Medicine

## 2012-05-08 ENCOUNTER — Ambulatory Visit
Admission: RE | Admit: 2012-05-08 | Discharge: 2012-05-08 | Disposition: A | Discharge status: Home / Self Care | Payer: Medicare Other | Source: Ambulatory Visit | Attending: Internal Medicine | Admitting: Internal Medicine

## 2012-05-08 DIAGNOSIS — R928 Other abnormal and inconclusive findings on diagnostic imaging of breast: Secondary | ICD-10-CM | POA: Diagnosis not present

## 2012-05-08 DIAGNOSIS — N63 Unspecified lump in unspecified breast: Secondary | ICD-10-CM

## 2012-05-13 ENCOUNTER — Other Ambulatory Visit (INDEPENDENT_AMBULATORY_CARE_PROVIDER_SITE_OTHER): Payer: Medicare Other

## 2012-05-13 DIAGNOSIS — I10 Essential (primary) hypertension: Secondary | ICD-10-CM

## 2012-05-13 LAB — BASIC METABOLIC PANEL
BUN: 35 mg/dL — ABNORMAL HIGH (ref 6–23)
Chloride: 104 mEq/L (ref 96–112)
Glucose, Bld: 111 mg/dL — ABNORMAL HIGH (ref 70–99)
Potassium: 4.6 mEq/L (ref 3.5–5.1)

## 2012-05-19 DIAGNOSIS — R32 Unspecified urinary incontinence: Secondary | ICD-10-CM | POA: Diagnosis not present

## 2012-05-19 DIAGNOSIS — N318 Other neuromuscular dysfunction of bladder: Secondary | ICD-10-CM | POA: Diagnosis not present

## 2012-05-19 DIAGNOSIS — N3941 Urge incontinence: Secondary | ICD-10-CM | POA: Diagnosis not present

## 2012-06-05 ENCOUNTER — Other Ambulatory Visit: Payer: Self-pay | Admitting: Internal Medicine

## 2012-06-15 ENCOUNTER — Ambulatory Visit: Payer: Medicare Other | Admitting: Internal Medicine

## 2012-06-19 DIAGNOSIS — R82998 Other abnormal findings in urine: Secondary | ICD-10-CM | POA: Diagnosis not present

## 2012-06-19 DIAGNOSIS — N3941 Urge incontinence: Secondary | ICD-10-CM | POA: Diagnosis not present

## 2012-06-23 DIAGNOSIS — N318 Other neuromuscular dysfunction of bladder: Secondary | ICD-10-CM | POA: Diagnosis not present

## 2012-06-23 DIAGNOSIS — D649 Anemia, unspecified: Secondary | ICD-10-CM | POA: Diagnosis not present

## 2012-06-23 DIAGNOSIS — I129 Hypertensive chronic kidney disease with stage 1 through stage 4 chronic kidney disease, or unspecified chronic kidney disease: Secondary | ICD-10-CM | POA: Diagnosis not present

## 2012-07-06 ENCOUNTER — Other Ambulatory Visit: Payer: Self-pay | Admitting: Internal Medicine

## 2012-07-06 ENCOUNTER — Other Ambulatory Visit: Payer: Self-pay | Admitting: *Deleted

## 2012-07-06 MED ORDER — LORAZEPAM 0.5 MG PO TABS: 0.5000 mg | ORAL_TABLET | Freq: Every day | ORAL | Status: DC | PRN

## 2012-07-07 ENCOUNTER — Ambulatory Visit (INDEPENDENT_AMBULATORY_CARE_PROVIDER_SITE_OTHER): Payer: Medicare Other | Admitting: Internal Medicine

## 2012-07-07 ENCOUNTER — Encounter: Payer: Self-pay | Admitting: Internal Medicine

## 2012-07-07 VITALS — BP 132/76 | HR 80 | Temp 98.3°F | Wt 214.0 lb

## 2012-07-07 DIAGNOSIS — K219 Gastro-esophageal reflux disease without esophagitis: Secondary | ICD-10-CM

## 2012-07-07 DIAGNOSIS — R32 Unspecified urinary incontinence: Secondary | ICD-10-CM

## 2012-07-07 DIAGNOSIS — M1710 Unilateral primary osteoarthritis, unspecified knee: Secondary | ICD-10-CM | POA: Diagnosis not present

## 2012-07-07 DIAGNOSIS — D649 Anemia, unspecified: Secondary | ICD-10-CM | POA: Diagnosis not present

## 2012-07-07 DIAGNOSIS — G2581 Restless legs syndrome: Secondary | ICD-10-CM

## 2012-07-07 DIAGNOSIS — N259 Disorder resulting from impaired renal tubular function, unspecified: Secondary | ICD-10-CM

## 2012-07-07 DIAGNOSIS — I10 Essential (primary) hypertension: Secondary | ICD-10-CM

## 2012-07-07 DIAGNOSIS — R7309 Other abnormal glucose: Secondary | ICD-10-CM | POA: Diagnosis not present

## 2012-07-07 LAB — BASIC METABOLIC PANEL
BUN: 39 mg/dL — ABNORMAL HIGH (ref 6–23)
CO2: 25 mEq/L (ref 19–32)
Chloride: 102 mEq/L (ref 96–112)
Creatinine, Ser: 1.6 mg/dL — ABNORMAL HIGH (ref 0.4–1.2)
Glucose, Bld: 110 mg/dL — ABNORMAL HIGH (ref 70–99)
Potassium: 5 mEq/L (ref 3.5–5.1)

## 2012-07-07 LAB — FERRITIN: Ferritin: 14.6 ng/mL (ref 10.0–291.0)

## 2012-07-07 LAB — CBC WITH DIFFERENTIAL/PLATELET
Eosinophils Absolute: 0.2 10*3/uL (ref 0.0–0.7)
Eosinophils Relative: 2.9 % (ref 0.0–5.0)
HCT: 35.5 % — ABNORMAL LOW (ref 36.0–46.0)
Lymphs Abs: 2.3 10*3/uL (ref 0.7–4.0)
MCHC: 32.8 g/dL (ref 30.0–36.0)
MCV: 89.3 fl (ref 78.0–100.0)
Monocytes Absolute: 0.3 10*3/uL (ref 0.1–1.0)
Neutrophils Relative %: 53.3 % (ref 43.0–77.0)
Platelets: 189 10*3/uL (ref 150.0–400.0)
RDW: 13.3 % (ref 11.5–14.6)

## 2012-07-07 MED ORDER — LABETALOL HCL 100 MG PO TABS: 100.0000 mg | ORAL_TABLET | Freq: Every day | ORAL | Status: DC

## 2012-07-07 NOTE — Assessment & Plan Note (Signed)
Well-controlled on PPI. ?

## 2012-07-07 NOTE — Assessment & Plan Note (Signed)
?   Cause Was on ACE-I now on ARB,  Will check labs today

## 2012-07-07 NOTE — Assessment & Plan Note (Signed)
Blood pressure well controlled On ARB-- check bmet today

## 2012-07-07 NOTE — Assessment & Plan Note (Signed)
Needs f/u- check a1c today

## 2012-07-07 NOTE — Progress Notes (Signed)
  Subjective:    Patient ID: Linda Bentley, female    DOB: 1928-11-30, 78 y.o.   MRN: [id number]  HPI  Lipids- tolerating meds htn-- no sxs on meds, tolerating meds Obese-- not trying to follow an exercise or diet plan Pain meds-- she does not take tramadol She has not been taking furosemide-- states that dr. Artist Pais discontinued it. She thinks the lack of furosemide has caused weight gain (she does not follow an exercise or weight loss diet)  Past Medical History  Diagnosis Date  . Depression   . GERD (gastroesophageal reflux disease)   . Hyperlipidemia   . Hypertension   . Osteopenia   . Renal insufficiency 2007    creat 1.3  . Hiatal hernia   . LBP (low back pain)   . UNSPECIFIED PERIPHERAL VASCULAR DISEASE 06/08/2010  . ANEMIA-UNSPECIFIED 07/03/2009  . HYPERGLYCEMIA 02/26/2007  . GERD 02/26/2007   Past Surgical History  Procedure Date  . Appendectomy 1962  . Cholecystectomy   . Oophorectomy 1962  . Parathyroidectomy   . Dilation and curettage of uterus 1962  . Wrist fracture surgery 10/01  . Tail bone surgery   . Esophagogastroduodenoscopy     with dilatation Dr Russella Dar  . Tonsillectomy and adenoidectomy   . Ingrown toenails      x3    reports that she has never smoked. She does not have any smokeless tobacco history on file. She reports that she does not drink alcohol or use illicit drugs. family history includes Colon cancer in her sister and Kidney disease in her father. Allergies  Allergen Reactions  . Diltiazem Hcl     REACTION: feeling drunk     Review of Systems  patient denies chest pain, shortness of breath, orthopnea. Denies lower extremity edema, abdominal pain, change in appetite, change in bowel movements. Patient denies rashes,.No other specific complaints in a complete review of systems except for chronic fatigue and she admits that her sleep is disrupted by "moving legs"     Objective:   Physical Exam  Well-developed well-nourished female in no  acute distress. HEENT exam atraumatic, normocephalic, extraocular muscles are intact. Neck is supple. No jugular venous distention no thyromegaly. Chest clear to auscultation without increased work of breathing. Cardiac exam S1 and S2 are regular. Abdominal exam active bowel sounds, soft, nontender.    Assessment & Plan:

## 2012-07-07 NOTE — Assessment & Plan Note (Signed)
Has had great control with Linda Bentley-- has regular f/u with urology

## 2012-07-17 DIAGNOSIS — M1710 Unilateral primary osteoarthritis, unspecified knee: Secondary | ICD-10-CM | POA: Diagnosis not present

## 2012-07-24 DIAGNOSIS — M1710 Unilateral primary osteoarthritis, unspecified knee: Secondary | ICD-10-CM | POA: Diagnosis not present

## 2012-07-31 DIAGNOSIS — M1710 Unilateral primary osteoarthritis, unspecified knee: Secondary | ICD-10-CM | POA: Diagnosis not present

## 2012-09-07 ENCOUNTER — Other Ambulatory Visit: Payer: Self-pay | Admitting: *Deleted

## 2012-09-07 MED ORDER — LOSARTAN POTASSIUM 25 MG PO TABS: 25.0000 mg | ORAL_TABLET | Freq: Every day | ORAL | Status: DC

## 2012-09-07 MED ORDER — LABETALOL HCL 100 MG PO TABS: 100.0000 mg | ORAL_TABLET | Freq: Every day | ORAL | Status: DC

## 2012-10-05 ENCOUNTER — Other Ambulatory Visit: Payer: Self-pay | Admitting: *Deleted

## 2012-10-05 MED ORDER — LORAZEPAM 0.5 MG PO TABS: 0.5000 mg | ORAL_TABLET | Freq: Every day | ORAL | Status: DC | PRN

## 2012-10-29 ENCOUNTER — Other Ambulatory Visit: Payer: Self-pay | Admitting: Internal Medicine

## 2012-10-29 ENCOUNTER — Telehealth: Payer: Self-pay | Admitting: Internal Medicine

## 2012-10-29 DIAGNOSIS — M899 Disorder of bone, unspecified: Secondary | ICD-10-CM

## 2012-10-29 DIAGNOSIS — R922 Inconclusive mammogram: Secondary | ICD-10-CM

## 2012-10-29 NOTE — Telephone Encounter (Signed)
Pt would like order for bone density test fax to [facility] center [phone]. Pt has mammogram sch on 11-23-12 at [facility] also.

## 2012-10-29 NOTE — Telephone Encounter (Signed)
Order placed in EPIC.

## 2012-11-23 ENCOUNTER — Other Ambulatory Visit: Payer: Self-pay | Admitting: Internal Medicine

## 2012-11-23 ENCOUNTER — Ambulatory Visit
Admission: RE | Admit: 2012-11-23 | Discharge: 2012-11-23 | Disposition: A | Discharge status: Home / Self Care | Payer: Medicare Other | Source: Ambulatory Visit | Attending: Internal Medicine | Admitting: Internal Medicine

## 2012-11-23 DIAGNOSIS — R922 Inconclusive mammogram: Secondary | ICD-10-CM

## 2012-11-23 DIAGNOSIS — M949 Disorder of cartilage, unspecified: Secondary | ICD-10-CM

## 2012-11-23 DIAGNOSIS — R9230 Dense breasts, unspecified: Secondary | ICD-10-CM

## 2013-01-04 ENCOUNTER — Other Ambulatory Visit: Payer: Self-pay | Admitting: Internal Medicine

## 2013-01-12 ENCOUNTER — Ambulatory Visit (INDEPENDENT_AMBULATORY_CARE_PROVIDER_SITE_OTHER): Payer: Medicare Other | Admitting: Internal Medicine

## 2013-01-12 ENCOUNTER — Encounter: Payer: Self-pay | Admitting: Internal Medicine

## 2013-01-12 VITALS — BP 142/82 | HR 72 | Temp 97.7°F | Wt 220.0 lb

## 2013-01-12 DIAGNOSIS — R7309 Other abnormal glucose: Secondary | ICD-10-CM

## 2013-01-12 DIAGNOSIS — Z23 Encounter for immunization: Secondary | ICD-10-CM

## 2013-01-12 DIAGNOSIS — R739 Hyperglycemia, unspecified: Secondary | ICD-10-CM

## 2013-01-12 DIAGNOSIS — R5381 Other malaise: Secondary | ICD-10-CM

## 2013-01-12 LAB — BASIC METABOLIC PANEL
Calcium: 9.3 mg/dL (ref 8.4–10.5)
Chloride: 108 mEq/L (ref 96–112)
Creatinine, Ser: 1.4 mg/dL — ABNORMAL HIGH (ref 0.4–1.2)

## 2013-01-12 LAB — CBC WITH DIFFERENTIAL/PLATELET
Basophils Relative: 0.2 % (ref 0.0–3.0)
Eosinophils Absolute: 0.4 10*3/uL (ref 0.0–0.7)
HCT: 34.8 % — ABNORMAL LOW (ref 36.0–46.0)
Hemoglobin: 11.6 g/dL — ABNORMAL LOW (ref 12.0–15.0)
Lymphs Abs: 2 10*3/uL (ref 0.7–4.0)
MCHC: 33.3 g/dL (ref 30.0–36.0)
MCV: 92.2 fl (ref 78.0–100.0)
Monocytes Absolute: 0.3 10*3/uL (ref 0.1–1.0)
Neutro Abs: 3.3 10*3/uL (ref 1.4–7.7)
RBC: 3.78 Mil/uL — ABNORMAL LOW (ref 3.87–5.11)

## 2013-01-12 LAB — HEPATIC FUNCTION PANEL
ALT: 16 U/L (ref 0–35)
Bilirubin, Direct: 0.1 mg/dL (ref 0.0–0.3)
Total Bilirubin: 0.7 mg/dL (ref 0.3–1.2)

## 2013-01-12 NOTE — Progress Notes (Signed)
Patient ID: Linda Bentley, female   DOB: [birth year], 79 y.o.   MRN: [id number]  She reports fatigue/no enrgy Ongoing for years but may be a little worse  SOB- once in a while she has to take a deep breath but she denies DOE  Obesity- she is not trying to lose weight  Past Medical History  Diagnosis Date  . Depression   . GERD (gastroesophageal reflux disease)   . Hyperlipidemia   . Hypertension   . Osteopenia   . Renal insufficiency 2007    creat 1.3  . Hiatal hernia   . LBP (low back pain)   . UNSPECIFIED PERIPHERAL VASCULAR DISEASE 06/08/2010  . ANEMIA-UNSPECIFIED 07/03/2009  . HYPERGLYCEMIA 02/26/2007  . GERD 02/26/2007    History   Social History  . Marital Status: Widowed    Spouse Name: N/A    Number of Children: N/A  . Years of Education: N/A   Occupational History  . Not on file.   Social History Main Topics  . Smoking status: Never Smoker   . Smokeless tobacco: Not on file  . Alcohol Use: No  . Drug Use: No  . Sexually Active:    Other Topics Concern  . Not on file   Social History Narrative  . No narrative on file    Past Surgical History  Procedure Laterality Date  . Appendectomy  1962  . Cholecystectomy    . Oophorectomy  1962  . Parathyroidectomy    . Dilation and curettage of uterus  1962  . Wrist fracture surgery  10/01  . Tail bone surgery    . Esophagogastroduodenoscopy      with dilatation Dr Russella Dar  . Tonsillectomy and adenoidectomy    . Ingrown toenails       x3    Family History  Problem Relation Age of Onset  . Kidney disease Father     failure  . Colon cancer Sister     Allergies  Allergen Reactions  . Diltiazem Hcl     REACTION: feeling drunk    Current Outpatient Prescriptions on File Prior to Visit  Medication Sig Dispense Refill  . acetaminophen (TYLENOL) 650 MG CR tablet Take 650 mg by mouth every 8 (eight) hours as needed.        Marland Kitchen aspirin 81 MG tablet Take 81 mg by mouth daily.        Marland Kitchen atorvastatin  (LIPITOR) 40 MG tablet TAKE 1 TABLET BY MOUTH ONCE DAILY.  30 tablet  5  . citalopram (CELEXA) 20 MG tablet TAKE 1 TABLET BY MOUTH DAILY.  90 tablet  3  . fish oil-omega-3 fatty acids 1000 MG capsule Take 2 g by mouth daily.        Marland Kitchen labetalol (NORMODYNE) 100 MG tablet Take 1 tablet (100 mg total) by mouth daily.  90 tablet  1  . LORazepam (ATIVAN) 0.5 MG tablet TAKE 1 TABLET BY MOUTH DAILY AS NEEDED FOR ANXIETY.  30 tablet  2  . losartan (COZAAR) 25 MG tablet Take 1 tablet (25 mg total) by mouth daily.  30 tablet  11  . Multiple Vitamin (MULTIVITAMIN) tablet Take 1 tablet by mouth daily.        Marland Kitchen omeprazole (PRILOSEC) 40 MG capsule TAKE 1 CAPSULE BY MOUTH DAILY.  90 capsule  3  . TOVIAZ 4 MG TB24 Take 1 tablet by mouth daily.       No current facility-administered medications on file prior to visit.  patient denies chest pain, shortness of breath, orthopnea. Denies lower extremity edema, abdominal pain, change in appetite, change in bowel movements. Patient denies rashes, musculoskeletal complaints. No other specific complaints in a complete review of systems.   BP 142/82  Pulse 72  Temp(Src) 97.7 F (36.5 C) (Oral)  Wt 220 lb (99.791 kg)  BMI 42.63 kg/m2  Elderly femaile Obese Chest- cta cv- reg rate abd- soft , + bs Exam-- 1 + edema but large ankles.   A/p- main complaint is fatigue-- suspect related to deconditioning and obesity Will check labs

## 2013-02-03 ENCOUNTER — Other Ambulatory Visit: Payer: Self-pay | Admitting: Internal Medicine

## 2013-03-04 ENCOUNTER — Other Ambulatory Visit: Payer: Self-pay | Admitting: *Deleted

## 2013-03-04 MED ORDER — LABETALOL HCL 100 MG PO TABS: 100.0000 mg | ORAL_TABLET | Freq: Every day | ORAL | Status: DC

## 2013-03-30 ENCOUNTER — Other Ambulatory Visit: Payer: Self-pay | Admitting: Orthopaedic Surgery

## 2013-03-30 DIAGNOSIS — M47816 Spondylosis without myelopathy or radiculopathy, lumbar region: Secondary | ICD-10-CM

## 2013-04-06 ENCOUNTER — Ambulatory Visit
Admission: RE | Admit: 2013-04-06 | Discharge: 2013-04-06 | Disposition: A | Discharge status: Home / Self Care | Payer: Medicare Other | Source: Ambulatory Visit | Attending: Orthopaedic Surgery | Admitting: Orthopaedic Surgery

## 2013-04-06 VITALS — BP 140/71 | HR 81

## 2013-04-06 DIAGNOSIS — M47816 Spondylosis without myelopathy or radiculopathy, lumbar region: Secondary | ICD-10-CM

## 2013-04-06 MED ORDER — METHYLPREDNISOLONE ACETATE 40 MG/ML INJ SUSP (RADIOLOG
120.0000 mg | Freq: Once | INTRAMUSCULAR | Status: AC
  Administered 2013-04-06: 120 mg via EPIDURAL

## 2013-04-06 MED ORDER — IOHEXOL 180 MG/ML  SOLN
1.0000 mL | Freq: Once | INTRAMUSCULAR | Status: AC | PRN
  Administered 2013-04-06: 1 mL via EPIDURAL

## 2013-04-09 ENCOUNTER — Other Ambulatory Visit: Payer: Self-pay | Admitting: *Deleted

## 2013-04-09 MED ORDER — LORAZEPAM 0.5 MG PO TABS: ORAL_TABLET | ORAL | Status: DC

## 2013-06-07 ENCOUNTER — Other Ambulatory Visit: Payer: Self-pay | Admitting: Internal Medicine

## 2013-07-06 ENCOUNTER — Ambulatory Visit: Payer: Medicare Other | Admitting: Internal Medicine

## 2013-07-07 ENCOUNTER — Other Ambulatory Visit: Payer: Self-pay | Admitting: Internal Medicine

## 2013-07-08 ENCOUNTER — Other Ambulatory Visit: Payer: Self-pay | Admitting: *Deleted

## 2013-07-08 MED ORDER — LORAZEPAM 0.5 MG PO TABS: ORAL_TABLET | ORAL | Status: DC

## 2013-07-19 ENCOUNTER — Ambulatory Visit (INDEPENDENT_AMBULATORY_CARE_PROVIDER_SITE_OTHER): Payer: Medicare Other | Admitting: Internal Medicine

## 2013-07-19 ENCOUNTER — Encounter: Payer: Self-pay | Admitting: Internal Medicine

## 2013-07-19 VITALS — BP 150/82 | HR 64 | Temp 98.0°F | Ht 60.25 in | Wt 226.0 lb

## 2013-07-19 DIAGNOSIS — I10 Essential (primary) hypertension: Secondary | ICD-10-CM

## 2013-07-19 DIAGNOSIS — E785 Hyperlipidemia, unspecified: Secondary | ICD-10-CM

## 2013-07-19 DIAGNOSIS — K219 Gastro-esophageal reflux disease without esophagitis: Secondary | ICD-10-CM

## 2013-07-19 DIAGNOSIS — E669 Obesity, unspecified: Secondary | ICD-10-CM

## 2013-07-19 DIAGNOSIS — R7309 Other abnormal glucose: Secondary | ICD-10-CM

## 2013-07-19 DIAGNOSIS — N289 Disorder of kidney and ureter, unspecified: Secondary | ICD-10-CM

## 2013-07-19 DIAGNOSIS — G2581 Restless legs syndrome: Secondary | ICD-10-CM

## 2013-07-19 LAB — BASIC METABOLIC PANEL
BUN: 36 mg/dL — ABNORMAL HIGH (ref 6–23)
Chloride: 108 mEq/L (ref 96–112)
Glucose, Bld: 111 mg/dL — ABNORMAL HIGH (ref 70–99)
Potassium: 4.9 mEq/L (ref 3.5–5.1)

## 2013-07-19 LAB — HEPATIC FUNCTION PANEL
ALT: 16 U/L (ref 0–35)
AST: 19 U/L (ref 0–37)
Albumin: 4.2 g/dL (ref 3.5–5.2)
Alkaline Phosphatase: 62 U/L (ref 39–117)

## 2013-07-19 LAB — LIPID PANEL
Total CHOL/HDL Ratio: 3
VLDL: 31.2 mg/dL (ref 0.0–40.0)

## 2013-07-19 LAB — FERRITIN: Ferritin: 24.1 ng/mL (ref 10.0–291.0)

## 2013-07-19 LAB — TSH: TSH: 2.8 u[IU]/mL (ref 0.35–5.50)

## 2013-07-19 LAB — CBC WITH DIFFERENTIAL/PLATELET
Basophils Relative: 0.2 % (ref 0.0–3.0)
Eosinophils Relative: 3 % (ref 0.0–5.0)
MCV: 93.1 fl (ref 78.0–100.0)
Monocytes Absolute: 0.4 10*3/uL (ref 0.1–1.0)
Monocytes Relative: 5.1 % (ref 3.0–12.0)
Neutrophils Relative %: 62.2 % (ref 43.0–77.0)
RBC: 3.67 Mil/uL — ABNORMAL LOW (ref 3.87–5.11)
WBC: 7.1 10*3/uL (ref 4.5–10.5)

## 2013-07-19 NOTE — Progress Notes (Signed)
Lipids- tolerating meds  htn- no home bps but tolerating meds  GERD- no sxs on PPI  Reviewed pmh, meds, probs  Ros- she has a normal appetite Denies CP, SOB, PND, orthopnea.  Exam: BP 150/82  Pulse 64  Temp(Src) 98 F (36.7 C) (Oral)  Ht 5' 0.25" (1.53 m)  Wt 226 lb (102.513 kg)  BMI 43.79 kg/m2  Well-developed well-nourished female in no acute distress. HEENT exam atraumatic, normocephalic, extraocular muscles are intact. Neck is supple. No jugular venous distention no thyromegaly. Chest clear to auscultation without increased work of breathing. Cardiac exam S1 and S2 are regular. Abdominal exam active bowel sounds, soft, nontender. Extremities: lower extremities- large with minimal pitting edema. Neurologic exam she is alert without any motor sensory deficits. Gait is normal.  .

## 2013-07-19 NOTE — Progress Notes (Signed)
Pre visit review using our clinic review tool, if applicable. No additional management support is needed unless otherwise documented below in the visit note. 

## 2013-07-20 NOTE — Assessment & Plan Note (Signed)
Well controlled Continue ppi

## 2013-07-20 NOTE — Assessment & Plan Note (Signed)
Lab Results  Component Value Date   HGBA1C 6.0 07/19/2013   Will monitor q6-12 months

## 2013-07-20 NOTE — Assessment & Plan Note (Signed)
BP Readings from Last 3 Encounters:  07/19/13 150/82  04/06/13 140/71  01/12/13 142/82   Will need to follow Given her age will continue same meds

## 2013-08-31 ENCOUNTER — Other Ambulatory Visit: Payer: Self-pay | Admitting: Internal Medicine

## 2013-10-08 ENCOUNTER — Other Ambulatory Visit: Payer: Self-pay | Admitting: *Deleted

## 2013-10-08 MED ORDER — LORAZEPAM 0.5 MG PO TABS: ORAL_TABLET | ORAL | Status: DC

## 2013-10-20 ENCOUNTER — Encounter: Payer: Self-pay | Admitting: *Deleted

## 2013-10-22 ENCOUNTER — Other Ambulatory Visit: Payer: Self-pay

## 2013-10-22 DIAGNOSIS — Z1231 Encounter for screening mammogram for malignant neoplasm of breast: Secondary | ICD-10-CM

## 2013-11-24 ENCOUNTER — Ambulatory Visit
Admission: RE | Admit: 2013-11-24 | Discharge: 2013-11-24 | Disposition: A | Discharge status: Home / Self Care | Payer: Self-pay | Source: Ambulatory Visit

## 2013-11-24 ENCOUNTER — Encounter (INDEPENDENT_AMBULATORY_CARE_PROVIDER_SITE_OTHER): Payer: Self-pay

## 2013-11-24 DIAGNOSIS — Z1231 Encounter for screening mammogram for malignant neoplasm of breast: Secondary | ICD-10-CM

## 2013-12-04 ENCOUNTER — Other Ambulatory Visit: Payer: Self-pay | Admitting: Internal Medicine

## 2014-01-04 ENCOUNTER — Other Ambulatory Visit: Payer: Self-pay | Admitting: Internal Medicine

## 2014-01-25 ENCOUNTER — Encounter: Payer: Self-pay | Admitting: Internal Medicine

## 2014-01-25 ENCOUNTER — Ambulatory Visit (INDEPENDENT_AMBULATORY_CARE_PROVIDER_SITE_OTHER): Payer: Medicare Other | Admitting: Internal Medicine

## 2014-01-25 VITALS — BP 130/76 | HR 80 | Temp 97.7°F | Wt 212.0 lb

## 2014-01-25 DIAGNOSIS — R7309 Other abnormal glucose: Secondary | ICD-10-CM

## 2014-01-25 DIAGNOSIS — D649 Anemia, unspecified: Secondary | ICD-10-CM | POA: Insufficient documentation

## 2014-01-25 DIAGNOSIS — K219 Gastro-esophageal reflux disease without esophagitis: Secondary | ICD-10-CM

## 2014-01-25 DIAGNOSIS — N259 Disorder resulting from impaired renal tubular function, unspecified: Secondary | ICD-10-CM

## 2014-01-25 NOTE — Progress Notes (Signed)
Renal insuff- reviewed nephrology note(s)  htn- no home bps  gerd- well controlled on ppi  lipids Lipid Panel     Component Value Date/Time   CHOL 141 07/19/2013 1016   TRIG 156.0* 07/19/2013 1016   HDL 50.40 07/19/2013 1016   CHOLHDL 3 07/19/2013 1016   VLDL 31.2 07/19/2013 1016   LDLCALC 59 07/19/2013 1016   Review of systems: Other complaints.  Review problems, allergies, medications.  Review vital signs. In general she appears well. She is overweight. HEENT exam: Atraumatic, normocephalic. Extraocular muscles are intact Chest is clear to auscultation Cardiac exam S1-S2 are regular Extremities: Ankles are quite large but no pitting edema.  RENAL INSUFFICIENCY Basic Metabolic Panel:    Component Value Date/Time   NA 141 07/19/2013 1016   K 4.9 07/19/2013 1016   CL 108 07/19/2013 1016   CO2 25 07/19/2013 1016   BUN 36* 07/19/2013 1016   CREATININE 1.4* 07/19/2013 1016   GLUCOSE 111* 07/19/2013 1016   GLUCOSE 105* 06/10/2006 1112   CALCIUM 9.5 07/19/2013 1016   she will need yearly followup.   HYPERGLYCEMIA Discussed need for aggressive weight loss. Her BMI should be less than 25.  GERD Well-controlled on ppi.  Anemia CBC:    Component Value Date/Time   WBC 7.1 07/19/2013 1016   HGB 11.4* 07/19/2013 1016   HCT 34.1* 07/19/2013 1016   PLT 166.0 07/19/2013 1016   MCV 93.1 07/19/2013 1016   NEUTROABS 4.4 07/19/2013 1016   LYMPHSABS 2.1 07/19/2013 1016   MONOABS 0.4 07/19/2013 1016   EOSABS 0.2 07/19/2013 1016   BASOSABS 0.0 07/19/2013 1016

## 2014-01-25 NOTE — Progress Notes (Signed)
Pre visit review using our clinic review tool, if applicable. No additional management support is needed unless otherwise documented below in the visit note. 

## 2014-01-27 NOTE — Assessment & Plan Note (Signed)
CBC:    Component Value Date/Time   WBC 7.1 07/19/2013 1016   HGB 11.4* 07/19/2013 1016   HCT 34.1* 07/19/2013 1016   PLT 166.0 07/19/2013 1016   MCV 93.1 07/19/2013 1016   NEUTROABS 4.4 07/19/2013 1016   LYMPHSABS 2.1 07/19/2013 1016   MONOABS 0.4 07/19/2013 1016   EOSABS 0.2 07/19/2013 1016   BASOSABS 0.0 07/19/2013 1016

## 2014-01-27 NOTE — Assessment & Plan Note (Signed)
Discussed need for aggressive weight loss. Her BMI should be less than 25.

## 2014-01-27 NOTE — Assessment & Plan Note (Signed)
Well controlled on ppi

## 2014-01-27 NOTE — Assessment & Plan Note (Signed)
Basic Metabolic Panel:    Component Value Date/Time   NA 141 07/19/2013 1016   K 4.9 07/19/2013 1016   CL 108 07/19/2013 1016   CO2 25 07/19/2013 1016   BUN 36* 07/19/2013 1016   CREATININE 1.4* 07/19/2013 1016   GLUCOSE 111* 07/19/2013 1016   GLUCOSE 105* 06/10/2006 1112   CALCIUM 9.5 07/19/2013 1016   she will need yearly followup.

## 2014-03-05 ENCOUNTER — Other Ambulatory Visit: Payer: Self-pay | Admitting: Internal Medicine

## 2014-03-07 ENCOUNTER — Telehealth: Payer: Self-pay | Admitting: *Deleted

## 2014-03-07 NOTE — Telephone Encounter (Signed)
Opened in error

## 2014-03-29 ENCOUNTER — Encounter: Payer: Self-pay | Admitting: Gastroenterology

## 2014-04-14 ENCOUNTER — Other Ambulatory Visit: Payer: Self-pay | Admitting: *Deleted

## 2014-04-14 MED ORDER — LORAZEPAM 0.5 MG PO TABS: ORAL_TABLET | ORAL | Status: DC

## 2014-06-03 ENCOUNTER — Other Ambulatory Visit: Payer: Self-pay | Admitting: Internal Medicine

## 2014-07-07 ENCOUNTER — Other Ambulatory Visit: Payer: Self-pay | Admitting: Internal Medicine

## 2014-07-11 ENCOUNTER — Encounter: Payer: Self-pay | Admitting: Internal Medicine

## 2014-07-11 ENCOUNTER — Ambulatory Visit (INDEPENDENT_AMBULATORY_CARE_PROVIDER_SITE_OTHER): Payer: Medicare Other | Admitting: Internal Medicine

## 2014-07-11 ENCOUNTER — Ambulatory Visit (INDEPENDENT_AMBULATORY_CARE_PROVIDER_SITE_OTHER): Payer: Medicare Other | Admitting: *Deleted

## 2014-07-11 VITALS — BP 134/76 | HR 72 | Temp 98.6°F | Ht 60.25 in | Wt 221.0 lb

## 2014-07-11 DIAGNOSIS — R42 Dizziness and giddiness: Secondary | ICD-10-CM

## 2014-07-11 DIAGNOSIS — Z23 Encounter for immunization: Secondary | ICD-10-CM

## 2014-07-11 DIAGNOSIS — R002 Palpitations: Secondary | ICD-10-CM

## 2014-07-11 DIAGNOSIS — R7309 Other abnormal glucose: Secondary | ICD-10-CM

## 2014-07-11 DIAGNOSIS — N183 Chronic kidney disease, stage 3 unspecified: Secondary | ICD-10-CM

## 2014-07-11 DIAGNOSIS — Z0000 Encounter for general adult medical examination without abnormal findings: Secondary | ICD-10-CM | POA: Insufficient documentation

## 2014-07-11 DIAGNOSIS — R7303 Prediabetes: Secondary | ICD-10-CM

## 2014-07-11 DIAGNOSIS — I10 Essential (primary) hypertension: Secondary | ICD-10-CM

## 2014-07-11 DIAGNOSIS — E785 Hyperlipidemia, unspecified: Secondary | ICD-10-CM

## 2014-07-11 DIAGNOSIS — R2681 Unsteadiness on feet: Secondary | ICD-10-CM

## 2014-07-11 NOTE — Assessment & Plan Note (Signed)
Stable.  No change in medication. BP Readings from Last 3 Encounters:  07/11/14 134/76  01/25/14 130/76  07/19/13 150/82   Lab Results  Component Value Date   NA 141 07/19/2013   K 4.9 07/19/2013   CL 108 07/19/2013   CO2 25 07/19/2013   Lab Results  Component Value Date   CREATININE 1.4* 07/19/2013

## 2014-07-11 NOTE — Assessment & Plan Note (Signed)
Medicare wellness questionnaire reviewed with patient.   See attached form for details. Screening for substance abuse negative. She continues to exercise 4 times per week. She has issues with gait instability. Referral to physical therapy arranged. Gross hearing is normal. Patient able to hear finger rub. She denies any symptoms of depression. Screening for memory loss negative.

## 2014-07-11 NOTE — Assessment & Plan Note (Signed)
Monitor A1c 

## 2014-07-11 NOTE — Progress Notes (Signed)
Pre visit review using our clinic review tool, if applicable. No additional management support is needed unless otherwise documented below in the visit note. 

## 2014-07-11 NOTE — Assessment & Plan Note (Signed)
80 year old white female with intermittent symptoms of dizziness, weakness and palpitations. EKG Shows sinus rhythm at 69 bpm. Left axis, nonspecific T-wave changes.   Arrange 48 hr holter monitor.  Patient may require additional cardiac testing.

## 2014-07-11 NOTE — Progress Notes (Signed)
Subjective:    Patient ID: Linda Bentley, female    DOB: Dec 10, 1928, 80 y.o.   MRN: [id number]  HPI  80 year old white female with history of hypertension, hyperlipidemia and chronic renal sufficiency presents for Medicare physical. Medicare wellness questionnaire reviewed in detail. She denies any hospitalizations or emergency room visits within the past year. She is followed by nephrologist-Dr. Lyda Kalata. Her last office visit was in May 2015. She has not had any surgeries in the last year. Her last eye exam was performed in January 2015 by Dr. Melissa Noon.  See attached Medicare wellness questionnaire for additional details.  Patient is widowed she lives alone. No history of tobacco or alcohol use. Patient able to perform most household chores with the assistance of a made. She denies any falls within the past year. She does have unsteady gait secondary to significant osteoarthritis especially of her right knee. She is followed by orthopedic specialist-Dr. Durward Fortes.  She denies any hearing loss. She denies any depressive symptoms.   Patient complains of intermittent dizziness and weakness. Her symptoms associated with palpitations. Her symptoms have been intermittent/ongoing over the last 12 months. Patient lays down until "spell" passes. She denies any associated chest pain.  Hypertension - stable  Prediabetes - weight gain  Review of Systems  Constitutional: Negative for activity change, appetite change and unexpected weight change.  Eyes: Negative for visual disturbance.  Respiratory: Negative for cough, chest tightness and shortness of breath.   Cardiovascular: Negative for chest pain. palpitations Genitourinary: Negative for difficulty urinating. urinary incontinence, she wears pads Neurological: Negative for headaches.  Gastrointestinal: Negative for abdominal pain, heartburn melena or hematochezia Psych: Negative for depression or anxiety Endo:  No polyuria or  polydypsia        Past Medical History  Diagnosis Date  . Depression   . GERD (gastroesophageal reflux disease)   . Hyperlipidemia   . Hypertension   . Osteopenia   . Renal insufficiency 2007    creat 1.3  . Hiatal hernia   . LBP (low back pain)   . UNSPECIFIED PERIPHERAL VASCULAR DISEASE 06/08/2010  . ANEMIA-UNSPECIFIED 07/03/2009  . HYPERGLYCEMIA 02/26/2007  . GERD 02/26/2007    History   Social History  . Marital Status: Widowed    Spouse Name: N/A    Number of Children: 0  . Years of Education: N/A   Occupational History  . Retired    Social History Main Topics  . Smoking status: Never Smoker   . Smokeless tobacco: Not on file  . Alcohol Use: No  . Drug Use: No  . Sexual Activity: Not on file   Other Topics Concern  . Not on file   Social History Narrative   Physician roster:      Nephrologist-Dr. Lyda Kalata   Ophthalmologists-Dr. [place] specialist-Dr. Dara Lords - husband passed 21 years ago.  No children    Past Surgical History  Procedure Laterality Date  . Appendectomy  1962  . Cholecystectomy    . Oophorectomy  1962  . Parathyroidectomy    . Dilation and curettage of uterus  1962  . Wrist fracture surgery  10/01  . Tail bone surgery    . Esophagogastroduodenoscopy      with dilatation Dr Fuller Plan  . Tonsillectomy and adenoidectomy    . Ingrown toenails       x3    Family History  Problem Relation Age of Onset  . Kidney disease Father  failure  . Colon cancer Sister     Allergies  Allergen Reactions  . Diltiazem Hcl Other (See Comments)    REACTION: feeling drunk    Current Outpatient Prescriptions on File Prior to Visit  Medication Sig Dispense Refill  . atorvastatin (LIPITOR) 40 MG tablet TAKE 1 TABLET BY MOUTH ONCE DAILY. 30 tablet 5  . calcium carbonate (TUMS - DOSED IN MG ELEMENTAL CALCIUM) 500 MG chewable tablet Chew 1 tablet by mouth daily as needed for heartburn.    . citalopram (CELEXA) 20  MG tablet TAKE 1 TABLET BY MOUTH DAILY. 90 tablet 3  . furosemide (LASIX) 40 MG tablet Take 1 tablet by mouth daily.    Marland Kitchen labetalol (NORMODYNE) 100 MG tablet TAKE 1 TABLET BY MOUTH DAILY. 90 tablet 2  . LORazepam (ATIVAN) 0.5 MG tablet TAKE 1 TABLET BY MOUTH DAILY AS NEEDED FOR ANXIETY. 30 tablet 5  . losartan (COZAAR) 25 MG tablet TAKE 1 TABLET BY MOUTH DAILY. 30 tablet 11  . meclizine (ANTIVERT) 12.5 MG tablet TAKE 1 TABLET BY MOUTH AS NEEDED. 30 tablet 5  . Multiple Vitamin (MULTIVITAMIN) tablet Take 1 tablet by mouth daily.      Marland Kitchen omeprazole (PRILOSEC) 40 MG capsule TAKE 1 CAPSULE BY MOUTH DAILY. 90 capsule 3  . TOVIAZ 4 MG TB24 Take 1 tablet by mouth daily.     No current facility-administered medications on file prior to visit.    BP 134/76 mmHg  Pulse 72  Temp(Src) 98.6 F (37 C) (Oral)  Ht 5' 0.25" (1.53 m)  Wt 221 lb (100.245 kg)  BMI 42.82 kg/m2      Objective:   Physical Exam  Constitutional: She is oriented to person, place, and time. She appears well-developed and well-nourished. No distress.  HENT:  Head: Normocephalic and atraumatic.  Right Ear: External ear normal.  Left Ear: External ear normal.  Mouth/Throat: Oropharynx is clear and moist.  Eyes: EOM are normal. Pupils are equal, round, and reactive to light. No scleral icterus.  Neck: Neck supple.  Cardiovascular: Normal rate, regular rhythm and normal heart sounds.   No murmur heard. Pulmonary/Chest: Effort normal and breath sounds normal. She has no wheezes.  Abdominal: Soft. Bowel sounds are normal. There is no tenderness.  Musculoskeletal:  Valgus deformity of both knees Ambulates with single-prong cane Unsteady gait Bilateral non pitting ankle swelling  Lymphadenopathy:    She has no cervical adenopathy.  Neurological: She is alert and oriented to person, place, and time. No cranial nerve deficit.  Skin: Skin is warm and dry.  Psychiatric: She has a normal mood and affect. Her behavior is  normal.          Assessment & Plan:

## 2014-07-12 ENCOUNTER — Telehealth: Payer: Self-pay | Admitting: Internal Medicine

## 2014-07-12 NOTE — Telephone Encounter (Signed)
emmi mailed  °

## 2014-07-19 ENCOUNTER — Encounter (INDEPENDENT_AMBULATORY_CARE_PROVIDER_SITE_OTHER): Payer: Medicare Other

## 2014-07-19 ENCOUNTER — Encounter: Payer: Self-pay | Admitting: *Deleted

## 2014-07-19 ENCOUNTER — Ambulatory Visit: Payer: Medicare Other | Admitting: Internal Medicine

## 2014-07-19 DIAGNOSIS — R42 Dizziness and giddiness: Secondary | ICD-10-CM

## 2014-07-19 DIAGNOSIS — R002 Palpitations: Secondary | ICD-10-CM

## 2014-07-19 NOTE — Progress Notes (Signed)
Patient ID: Linda Bentley, female   DOB: March 22, 1929, 80 y.o.   MRN: [id number] [facility] 48 hour holter monitor applied to patient.

## 2014-08-09 ENCOUNTER — Ambulatory Visit: Payer: Medicare Other | Attending: Internal Medicine | Admitting: Physical Therapy

## 2014-08-09 DIAGNOSIS — R2681 Unsteadiness on feet: Secondary | ICD-10-CM | POA: Insufficient documentation

## 2014-08-09 DIAGNOSIS — R262 Difficulty in walking, not elsewhere classified: Secondary | ICD-10-CM | POA: Diagnosis not present

## 2014-08-09 DIAGNOSIS — M545 Low back pain: Secondary | ICD-10-CM | POA: Insufficient documentation

## 2014-08-12 ENCOUNTER — Ambulatory Visit: Payer: Medicare Other | Admitting: Internal Medicine

## 2014-08-12 DIAGNOSIS — D3101 Benign neoplasm of right conjunctiva: Secondary | ICD-10-CM | POA: Diagnosis not present

## 2014-08-18 ENCOUNTER — Ambulatory Visit: Payer: Medicare Other | Admitting: Physical Therapy

## 2014-08-18 DIAGNOSIS — M545 Low back pain: Secondary | ICD-10-CM | POA: Diagnosis not present

## 2014-08-18 DIAGNOSIS — R262 Difficulty in walking, not elsewhere classified: Secondary | ICD-10-CM | POA: Diagnosis not present

## 2014-08-18 DIAGNOSIS — R2681 Unsteadiness on feet: Secondary | ICD-10-CM | POA: Diagnosis not present

## 2014-09-05 ENCOUNTER — Other Ambulatory Visit: Payer: Self-pay | Admitting: Internal Medicine

## 2014-09-09 ENCOUNTER — Ambulatory Visit: Payer: Medicare Other | Admitting: Internal Medicine

## 2014-10-10 ENCOUNTER — Encounter: Payer: Self-pay | Admitting: Internal Medicine

## 2014-10-10 ENCOUNTER — Ambulatory Visit (INDEPENDENT_AMBULATORY_CARE_PROVIDER_SITE_OTHER): Payer: Medicare Other | Admitting: Internal Medicine

## 2014-10-10 VITALS — BP 124/58 | HR 72 | Temp 97.9°F | Ht 60.25 in | Wt 215.0 lb

## 2014-10-10 DIAGNOSIS — N3946 Mixed incontinence: Secondary | ICD-10-CM

## 2014-10-10 DIAGNOSIS — R1310 Dysphagia, unspecified: Secondary | ICD-10-CM

## 2014-10-10 DIAGNOSIS — R51 Headache: Secondary | ICD-10-CM

## 2014-10-10 DIAGNOSIS — M542 Cervicalgia: Secondary | ICD-10-CM

## 2014-10-10 DIAGNOSIS — I10 Essential (primary) hypertension: Secondary | ICD-10-CM | POA: Diagnosis not present

## 2014-10-10 DIAGNOSIS — R7303 Prediabetes: Secondary | ICD-10-CM

## 2014-10-10 DIAGNOSIS — R7309 Other abnormal glucose: Secondary | ICD-10-CM

## 2014-10-10 DIAGNOSIS — Z7189 Other specified counseling: Secondary | ICD-10-CM

## 2014-10-10 DIAGNOSIS — E785 Hyperlipidemia, unspecified: Secondary | ICD-10-CM | POA: Diagnosis not present

## 2014-10-10 DIAGNOSIS — R519 Headache, unspecified: Secondary | ICD-10-CM | POA: Insufficient documentation

## 2014-10-10 LAB — CBC WITH DIFFERENTIAL/PLATELET
Basophils Absolute: 0 10*3/uL (ref 0.0–0.1)
Basophils Relative: 0.2 % (ref 0.0–3.0)
Eosinophils Absolute: 0.3 10*3/uL (ref 0.0–0.7)
Eosinophils Relative: 4.1 % (ref 0.0–5.0)
HCT: 33.1 % — ABNORMAL LOW (ref 36.0–46.0)
Hemoglobin: 11.1 g/dL — ABNORMAL LOW (ref 12.0–15.0)
Lymphocytes Relative: 34.2 % (ref 12.0–46.0)
Lymphs Abs: 2.2 10*3/uL (ref 0.7–4.0)
MCHC: 33.5 g/dL (ref 30.0–36.0)
MCV: 89.7 fl (ref 78.0–100.0)
Monocytes Absolute: 0.4 10*3/uL (ref 0.1–1.0)
Monocytes Relative: 6.9 % (ref 3.0–12.0)
Neutro Abs: 3.5 10*3/uL (ref 1.4–7.7)
Neutrophils Relative %: 54.6 % (ref 43.0–77.0)
Platelets: 181 10*3/uL (ref 150.0–400.0)
RBC: 3.69 Mil/uL — ABNORMAL LOW (ref 3.87–5.11)
RDW: 13.4 % (ref 11.5–15.5)
WBC: 6.4 10*3/uL (ref 4.0–10.5)

## 2014-10-10 LAB — BASIC METABOLIC PANEL WITH GFR
BUN: 42 mg/dL — ABNORMAL HIGH (ref 6–23)
CO2: 28 meq/L (ref 19–32)
Calcium: 9.4 mg/dL (ref 8.4–10.5)
Chloride: 102 meq/L (ref 96–112)
Creatinine, Ser: 1.69 mg/dL — ABNORMAL HIGH (ref 0.40–1.20)
GFR: 30.7 mL/min — ABNORMAL LOW
Glucose, Bld: 104 mg/dL — ABNORMAL HIGH (ref 70–99)
Potassium: 4.5 meq/L (ref 3.5–5.1)
Sodium: 137 meq/L (ref 135–145)

## 2014-10-10 LAB — HEPATIC FUNCTION PANEL
ALT: 13 U/L (ref 0–35)
AST: 14 U/L (ref 0–37)
Albumin: 4.3 g/dL (ref 3.5–5.2)
Alkaline Phosphatase: 70 U/L (ref 39–117)
BILIRUBIN DIRECT: 0.1 mg/dL (ref 0.0–0.3)
TOTAL PROTEIN: 7.2 g/dL (ref 6.0–8.3)
Total Bilirubin: 0.5 mg/dL (ref 0.2–1.2)

## 2014-10-10 LAB — HEMOGLOBIN A1C: HEMOGLOBIN A1C: 6.2 % (ref 4.6–6.5)

## 2014-10-10 LAB — LIPID PANEL
CHOLESTEROL: 121 mg/dL (ref 0–200)
HDL: 46.9 mg/dL (ref >39.00)
LDL Cholesterol: 45 mg/dL (ref 0–99)
NONHDL: 74.1
Total CHOL/HDL Ratio: 3
Triglycerides: 145 mg/dL (ref 0.0–149.0)
VLDL: 29 mg/dL (ref 0.0–40.0)

## 2014-10-10 LAB — TSH: TSH: 2.05 u[IU]/mL (ref 0.35–4.50)

## 2014-10-10 MED ORDER — POLYETHYLENE GLYCOL 3350 17 GM/SCOOP PO POWD: 17.0000 g | Freq: Two times a day (BID) | ORAL | Status: DC | PRN

## 2014-10-10 NOTE — Progress Notes (Signed)
Subjective:    Patient ID: Linda Bentley, female    DOB: May 07, 1929, 81 y.o.   MRN: [id number]  HPI  81 year old white female with history of hypertension, hyperlipidemia and chronic renal insufficiency for follow-up.  Patient has multiple complaints. She has noticed chronic pressure sensation back of her head. She also reports upper neck discomfort. No history of recent trauma or injury.  Patient also complains of food getting stuck with swallowing solids. She has remote history of endoscopy and esophageal dilation in the past. She is on chronic proton pump inhibitor therapy.  She also complains of chronic constipation. Her symptoms worse after starting oxybutynin.  She would like to also discuss end-of-life care planning. She has living will and durable medical power of attorney. She does not want any heroic measures if she were to suffer from cardiac arrest or severe stroke. She requests DO NOT RESUSCITATE form be completed.   Review of Systems Bowel movement every 3-4 days    Past Medical History  Diagnosis Date  . Depression   . GERD (gastroesophageal reflux disease)   . Hyperlipidemia   . Hypertension   . Osteopenia   . Renal insufficiency 2007    creat 1.3  . Hiatal hernia   . LBP (low back pain)   . UNSPECIFIED PERIPHERAL VASCULAR DISEASE 06/08/2010  . ANEMIA-UNSPECIFIED 07/03/2009  . HYPERGLYCEMIA 02/26/2007  . GERD 02/26/2007    History   Social History  . Marital Status: Widowed    Spouse Name: N/A  . Number of Children: 0  . Years of Education: N/A   Occupational History  . Retired    Social History Main Topics  . Smoking status: Never Smoker   . Smokeless tobacco: Not on file  . Alcohol Use: No  . Drug Use: No  . Sexual Activity: Not on file   Other Topics Concern  . Not on file   Social History Narrative   Physician roster:      Nephrologist-Dr. Lyda Kalata   Ophthalmologists-Dr. [place] specialist-Dr. Dara Lords  - husband passed 21 years ago.  No children    Past Surgical History  Procedure Laterality Date  . Appendectomy  1962  . Cholecystectomy    . Oophorectomy  1962  . Parathyroidectomy    . Dilation and curettage of uterus  1962  . Wrist fracture surgery  10/01  . Tail bone surgery    . Esophagogastroduodenoscopy      with dilatation Dr Fuller Plan  . Tonsillectomy and adenoidectomy    . Ingrown toenails       x3    Family History  Problem Relation Age of Onset  . Kidney disease Father     failure  . Colon cancer Sister     Allergies  Allergen Reactions  . Diltiazem Hcl Other (See Comments)    REACTION: feeling drunk    Current Outpatient Prescriptions on File Prior to Visit  Medication Sig Dispense Refill  . atorvastatin (LIPITOR) 40 MG tablet TAKE 1 TABLET BY MOUTH ONCE DAILY. 30 tablet 5  . calcium carbonate (TUMS - DOSED IN MG ELEMENTAL CALCIUM) 500 MG chewable tablet Chew 1 tablet by mouth daily as needed for heartburn.    . citalopram (CELEXA) 20 MG tablet TAKE 1 TABLET BY MOUTH DAILY. 90 tablet 3  . furosemide (LASIX) 40 MG tablet Take 1 tablet by mouth daily.    Marland Kitchen labetalol (NORMODYNE) 100 MG tablet TAKE 1 TABLET BY  MOUTH DAILY. 90 tablet 2  . LORazepam (ATIVAN) 0.5 MG tablet TAKE 1 TABLET BY MOUTH DAILY AS NEEDED FOR ANXIETY. 30 tablet 5  . losartan (COZAAR) 25 MG tablet TAKE 1 TABLET BY MOUTH DAILY. 30 tablet 11  . meclizine (ANTIVERT) 12.5 MG tablet TAKE 1 TABLET BY MOUTH AS NEEDED. 30 tablet 5  . Multiple Vitamin (MULTIVITAMIN) tablet Take 1 tablet by mouth daily.      Marland Kitchen omeprazole (PRILOSEC) 40 MG capsule TAKE 1 CAPSULE BY MOUTH DAILY. 90 capsule 3   No current facility-administered medications on file prior to visit.    BP 124/58 mmHg  Pulse 72  Temp(Src) 97.9 F (36.6 C) (Oral)  Ht 5' 0.25" (1.53 m)  Wt 215 lb (97.523 kg)  BMI 41.66 kg/m2    Objective:   Physical Exam  Constitutional: She is oriented to person, place, and time. She appears  well-developed and well-nourished. No distress.  HENT:  Head: Normocephalic and atraumatic.  Right Ear: External ear normal.  Left Ear: External ear normal.  Mouth/Throat: Oropharynx is clear and moist.  Uvula is midline Cerumen impaction of left ear canal  Eyes: EOM are normal. Pupils are equal, round, and reactive to light.  Neck:  limited range of motion of cervical spine  Neurological: She is alert and oriented to person, place, and time. No cranial nerve deficit.  Skin: Skin is warm and dry.  Psychiatric: She has a normal mood and affect. Her behavior is normal.          Assessment & Plan:

## 2014-10-10 NOTE — Progress Notes (Signed)
Pre visit review using our clinic review tool, if applicable. No additional management support is needed unless otherwise documented below in the visit note. 

## 2014-10-10 NOTE — Assessment & Plan Note (Signed)
Patient experiencing constipation as side effects from oxybutynin. Start daily MiraLAX and Metamucil.

## 2014-10-10 NOTE — Assessment & Plan Note (Signed)
She has living will and durable medical power of attorney. She does not want any heroic measures if she were to suffer from cardiac arrest or severe stroke. DO NOT RESUSCITATE form be completed.

## 2014-10-10 NOTE — Assessment & Plan Note (Signed)
81 year old white female complains of upper esophageal dysphagia. She had Endoscopy with esophageal dilation in 2010. She is on chronic PPI therapy. Obtain barium swallow.  We discussed possible referral to GI.

## 2014-10-10 NOTE — Assessment & Plan Note (Signed)
Patient complains of pressure sensation near occipital area and upper cervical spine. Her neurologic exam is non focal. Obtain CT of head and cervical spine.

## 2014-10-11 ENCOUNTER — Ambulatory Visit (INDEPENDENT_AMBULATORY_CARE_PROVIDER_SITE_OTHER)
Admission: RE | Admit: 2014-10-11 | Discharge: 2014-10-11 | Disposition: A | Discharge status: Home / Self Care | Payer: Medicare Other | Source: Ambulatory Visit | Attending: Internal Medicine | Admitting: Internal Medicine

## 2014-10-11 DIAGNOSIS — R519 Headache, unspecified: Secondary | ICD-10-CM

## 2014-10-11 DIAGNOSIS — M542 Cervicalgia: Secondary | ICD-10-CM

## 2014-10-11 DIAGNOSIS — R51 Headache: Secondary | ICD-10-CM | POA: Diagnosis not present

## 2014-10-11 DIAGNOSIS — I672 Cerebral atherosclerosis: Secondary | ICD-10-CM | POA: Diagnosis not present

## 2014-10-11 DIAGNOSIS — M47812 Spondylosis without myelopathy or radiculopathy, cervical region: Secondary | ICD-10-CM | POA: Diagnosis not present

## 2014-10-19 ENCOUNTER — Other Ambulatory Visit: Payer: Self-pay

## 2014-10-19 DIAGNOSIS — Z1231 Encounter for screening mammogram for malignant neoplasm of breast: Secondary | ICD-10-CM

## 2014-11-03 ENCOUNTER — Telehealth: Payer: Self-pay | Admitting: *Deleted

## 2014-11-03 MED ORDER — LORAZEPAM 0.5 MG PO TABS: ORAL_TABLET | ORAL | Status: DC

## 2014-11-03 NOTE — Telephone Encounter (Signed)
Ok to RF x 2

## 2014-11-03 NOTE — Telephone Encounter (Signed)
rx called in

## 2014-11-03 NOTE — Telephone Encounter (Signed)
Rx request from [facility] for Lorazepam 0.5 mg Qty:30 -- Take one tablet by mouth daily as needed for anxiety.

## 2014-11-09 ENCOUNTER — Encounter: Payer: Self-pay | Admitting: Internal Medicine

## 2014-11-09 ENCOUNTER — Ambulatory Visit (INDEPENDENT_AMBULATORY_CARE_PROVIDER_SITE_OTHER): Payer: Medicare Other | Admitting: Internal Medicine

## 2014-11-09 VITALS — BP 136/74 | HR 63 | Temp 97.8°F | Ht 60.25 in | Wt 213.0 lb

## 2014-11-09 DIAGNOSIS — I10 Essential (primary) hypertension: Secondary | ICD-10-CM | POA: Diagnosis not present

## 2014-11-09 DIAGNOSIS — N183 Chronic kidney disease, stage 3 unspecified: Secondary | ICD-10-CM

## 2014-11-09 DIAGNOSIS — R1310 Dysphagia, unspecified: Secondary | ICD-10-CM

## 2014-11-09 DIAGNOSIS — R519 Headache, unspecified: Secondary | ICD-10-CM

## 2014-11-09 DIAGNOSIS — R51 Headache: Secondary | ICD-10-CM | POA: Diagnosis not present

## 2014-11-09 DIAGNOSIS — R7309 Other abnormal glucose: Secondary | ICD-10-CM

## 2014-11-09 DIAGNOSIS — R7303 Prediabetes: Secondary | ICD-10-CM

## 2014-11-09 LAB — BASIC METABOLIC PANEL
BUN: 44 mg/dL — AB (ref 6–23)
CALCIUM: 9.3 mg/dL (ref 8.4–10.5)
CO2: 25 mEq/L (ref 19–32)
Chloride: 103 mEq/L (ref 96–112)
Creatinine, Ser: 1.55 mg/dL — ABNORMAL HIGH (ref 0.40–1.20)
GFR: 33.91 mL/min — AB (ref >60.00)
Glucose, Bld: 92 mg/dL (ref 70–99)
POTASSIUM: 4.7 meq/L (ref 3.5–5.1)
Sodium: 136 mEq/L (ref 135–145)

## 2014-11-09 MED ORDER — FUROSEMIDE 20 MG PO TABS: 20.0000 mg | ORAL_TABLET | Freq: Every day | ORAL | Status: DC

## 2014-11-09 MED ORDER — ATORVASTATIN CALCIUM 40 MG PO TABS: 40.0000 mg | ORAL_TABLET | Freq: Every day | ORAL | Status: DC

## 2014-11-09 NOTE — Assessment & Plan Note (Signed)
CRI stage III.  Lasix dose decreased.  Increase fluid intake. Monitor electrolytes and kidney function.

## 2014-11-09 NOTE — Patient Instructions (Signed)
Avoid concentrated sweets, sugary beverages and starchy foods You do not need another bone density scan Increase your fluid intake as directed Please complete the following lab tests before your next follow up appointment: BMET, A1c - hypertension, renal insufficiency stage 3, and abnormal glucose

## 2014-11-09 NOTE — Assessment & Plan Note (Signed)
Patient never completed barium swallow study. She reports her dysphagia is stable. She understands she will likely need repeat EGD if her symptoms progressively worsen.  She would like to avoid repeat esophageal dilation if possible.

## 2014-11-09 NOTE — Assessment & Plan Note (Signed)
CT of head and C spine negative for acute process.  Use warm compress and tylenol as needed.

## 2014-11-09 NOTE — Assessment & Plan Note (Signed)
Continue dietary management. Lab Results  Component Value Date   HGBA1C 6.2 10/10/2014

## 2014-11-09 NOTE — Progress Notes (Signed)
Subjective:    Patient ID: Linda Bentley, female    DOB: Oct 26, 1928, 81 y.o.   MRN: [id number]  HPI  81 year old white female previously seen for dysphagia, headache and neck pain for follow-up. Patient had CT of head and cervical spine. It was negative for any acute changes. She has mild degenerative changes of cervical spine.  Patient never scheduled barium swallow as directed. She reports her dysphagia has not gotten worse. She has seen gastroenterologist for esophageal dilation in the past. She would like to avoid this procedure if possible.  Recent blood work reviewed. She has stage III chronic renal insufficiency. Her serum creatinine slightly higher than usual. She denies any dehydrating illness. She denies taking any over-the-counter NSAIDs.  Review of Systems Constipation improved.  Negative for dehydrating illness    Past Medical History  Diagnosis Date  . Depression   . GERD (gastroesophageal reflux disease)   . Hyperlipidemia   . Hypertension   . Osteopenia   . Renal insufficiency 2007    creat 1.3  . Hiatal hernia   . LBP (low back pain)   . UNSPECIFIED PERIPHERAL VASCULAR DISEASE 06/08/2010  . ANEMIA-UNSPECIFIED 07/03/2009  . HYPERGLYCEMIA 02/26/2007  . GERD 02/26/2007    History   Social History  . Marital Status: Widowed    Spouse Name: N/A  . Number of Children: 0  . Years of Education: N/A   Occupational History  . Retired    Social History Main Topics  . Smoking status: Never Smoker   . Smokeless tobacco: Not on file  . Alcohol Use: No  . Drug Use: No  . Sexual Activity: Not on file   Other Topics Concern  . Not on file   Social History Narrative   Physician roster:      Nephrologist-Dr. Lyda Kalata   Ophthalmologists-Dr. [place] specialist-Dr. Dara Lords - husband passed 21 years ago.  No children    Past Surgical History  Procedure Laterality Date  . Appendectomy  1962  . Cholecystectomy    .  Oophorectomy  1962  . Parathyroidectomy    . Dilation and curettage of uterus  1962  . Wrist fracture surgery  10/01  . Tail bone surgery    . Esophagogastroduodenoscopy      with dilatation Dr Fuller Plan  . Tonsillectomy and adenoidectomy    . Ingrown toenails       x3    Family History  Problem Relation Age of Onset  . Kidney disease Father     failure  . Colon cancer Sister     Allergies  Allergen Reactions  . Diltiazem Hcl Other (See Comments)    REACTION: feeling drunk    Current Outpatient Prescriptions on File Prior to Visit  Medication Sig Dispense Refill  . atorvastatin (LIPITOR) 40 MG tablet TAKE 1 TABLET BY MOUTH ONCE DAILY. 30 tablet 5  . calcium carbonate (TUMS - DOSED IN MG ELEMENTAL CALCIUM) 500 MG chewable tablet Chew 1 tablet by mouth daily as needed for heartburn.    . citalopram (CELEXA) 20 MG tablet TAKE 1 TABLET BY MOUTH DAILY. 90 tablet 3  . furosemide (LASIX) 40 MG tablet Take 1 tablet by mouth daily.    Marland Kitchen labetalol (NORMODYNE) 100 MG tablet TAKE 1 TABLET BY MOUTH DAILY. 90 tablet 2  . LORazepam (ATIVAN) 0.5 MG tablet TAKE 1 TABLET BY MOUTH DAILY AS NEEDED FOR ANXIETY. 30 tablet 2  . losartan (COZAAR) 25  MG tablet TAKE 1 TABLET BY MOUTH DAILY. 30 tablet 11  . meclizine (ANTIVERT) 12.5 MG tablet TAKE 1 TABLET BY MOUTH AS NEEDED. 30 tablet 5  . Multiple Vitamin (MULTIVITAMIN) tablet Take 1 tablet by mouth daily.      Marland Kitchen omeprazole (PRILOSEC) 40 MG capsule TAKE 1 CAPSULE BY MOUTH DAILY. 90 capsule 3  . oxybutynin (DITROPAN XL) 15 MG 24 hr tablet Take 15 mg by mouth at bedtime.    . polyethylene glycol powder (GLYCOLAX/MIRALAX) powder Take 17 g by mouth 2 (two) times daily as needed. 3350 g 3   No current facility-administered medications on file prior to visit.    BP 136/74 mmHg  Pulse 63  Temp(Src) 97.8 F (36.6 C) (Oral)  Ht 5' 0.25" (1.53 m)  Wt 213 lb (96.616 kg)  BMI 41.27 kg/m2    Objective:   Physical Exam  Constitutional: She is oriented to  person, place, and time. She appears well-developed and well-nourished. No distress.  HENT:  Head: Normocephalic and atraumatic.  Cardiovascular: Normal rate, regular rhythm and normal heart sounds.   Pulmonary/Chest: Effort normal and breath sounds normal.  Musculoskeletal:  Trace lower extremity edema  Neurological: She is alert and oriented to person, place, and time.  Skin: Skin is warm and dry.  Psychiatric: She has a normal mood and affect. Her behavior is normal.          Assessment & Plan:

## 2014-11-09 NOTE — Progress Notes (Signed)
Pre visit review using our clinic review tool, if applicable. No additional management support is needed unless otherwise documented below in the visit note. 

## 2014-11-28 ENCOUNTER — Ambulatory Visit
Admission: RE | Admit: 2014-11-28 | Discharge: 2014-11-28 | Disposition: A | Discharge status: Home / Self Care | Payer: Medicare Other | Source: Ambulatory Visit

## 2014-11-28 DIAGNOSIS — Z1231 Encounter for screening mammogram for malignant neoplasm of breast: Secondary | ICD-10-CM | POA: Diagnosis not present

## 2014-12-05 ENCOUNTER — Other Ambulatory Visit: Payer: Self-pay | Admitting: Internal Medicine

## 2015-01-05 ENCOUNTER — Other Ambulatory Visit: Payer: Self-pay | Admitting: Internal Medicine

## 2015-01-20 DIAGNOSIS — D631 Anemia in chronic kidney disease: Secondary | ICD-10-CM | POA: Diagnosis not present

## 2015-01-20 DIAGNOSIS — N183 Chronic kidney disease, stage 3 (moderate): Secondary | ICD-10-CM | POA: Diagnosis not present

## 2015-01-20 DIAGNOSIS — I10 Essential (primary) hypertension: Secondary | ICD-10-CM | POA: Diagnosis not present

## 2015-01-20 DIAGNOSIS — N2581 Secondary hyperparathyroidism of renal origin: Secondary | ICD-10-CM | POA: Diagnosis not present

## 2015-02-03 ENCOUNTER — Telehealth: Payer: Self-pay | Admitting: Internal Medicine

## 2015-02-03 ENCOUNTER — Other Ambulatory Visit: Payer: Self-pay | Admitting: Internal Medicine

## 2015-02-03 NOTE — Telephone Encounter (Signed)
Call in #30 with no rf  

## 2015-02-03 NOTE — Telephone Encounter (Signed)
Dr Sarajane Jews approved rx.  Rx called in to [facility]

## 2015-02-03 NOTE — Telephone Encounter (Signed)
Pt request refill LORazepam (ATIVAN) 0.5 MG tablet  [facility]  30 day w/ refills  Pt needs asap.so it can be delivered  Thanks pt has appt 03/13/15

## 2015-03-03 ENCOUNTER — Other Ambulatory Visit: Payer: Self-pay | Admitting: Family Medicine

## 2015-03-08 ENCOUNTER — Telehealth: Payer: Self-pay | Admitting: Internal Medicine

## 2015-03-08 MED ORDER — LORAZEPAM 0.5 MG PO TABS: ORAL_TABLET | ORAL | Status: DC

## 2015-03-08 NOTE — Telephone Encounter (Signed)
Pt needs a refill on lorazepam. Pt has been out of med over a wk. [facility].  Please call into drug today for tomorrow delivery

## 2015-03-08 NOTE — Telephone Encounter (Signed)
Rx called in 

## 2015-03-13 ENCOUNTER — Ambulatory Visit (INDEPENDENT_AMBULATORY_CARE_PROVIDER_SITE_OTHER)
Admission: RE | Admit: 2015-03-13 | Discharge: 2015-03-13 | Disposition: A | Discharge status: Home / Self Care | Payer: Medicare Other | Source: Ambulatory Visit | Attending: Internal Medicine | Admitting: Internal Medicine

## 2015-03-13 ENCOUNTER — Ambulatory Visit (INDEPENDENT_AMBULATORY_CARE_PROVIDER_SITE_OTHER): Payer: Medicare Other | Admitting: Internal Medicine

## 2015-03-13 ENCOUNTER — Encounter: Payer: Self-pay | Admitting: Internal Medicine

## 2015-03-13 VITALS — BP 136/66 | Temp 97.9°F | Ht 60.25 in | Wt 206.2 lb

## 2015-03-13 DIAGNOSIS — N183 Chronic kidney disease, stage 3 unspecified: Secondary | ICD-10-CM

## 2015-03-13 DIAGNOSIS — R059 Cough, unspecified: Secondary | ICD-10-CM

## 2015-03-13 DIAGNOSIS — R7303 Prediabetes: Secondary | ICD-10-CM

## 2015-03-13 DIAGNOSIS — R05 Cough: Secondary | ICD-10-CM | POA: Diagnosis not present

## 2015-03-13 DIAGNOSIS — I10 Essential (primary) hypertension: Secondary | ICD-10-CM | POA: Diagnosis not present

## 2015-03-13 DIAGNOSIS — R7309 Other abnormal glucose: Secondary | ICD-10-CM

## 2015-03-13 LAB — BASIC METABOLIC PANEL
BUN: 45 mg/dL — ABNORMAL HIGH (ref 6–23)
CALCIUM: 9.4 mg/dL (ref 8.4–10.5)
CO2: 26 mEq/L (ref 19–32)
CREATININE: 1.76 mg/dL — AB (ref 0.40–1.20)
Chloride: 102 mEq/L (ref 96–112)
GFR: 29.26 mL/min — ABNORMAL LOW (ref >60.00)
GLUCOSE: 107 mg/dL — AB (ref 70–99)
POTASSIUM: 4.6 meq/L (ref 3.5–5.1)
Sodium: 138 mEq/L (ref 135–145)

## 2015-03-13 LAB — HEMOGLOBIN A1C: Hgb A1c MFr Bld: 5.9 % (ref 4.6–6.5)

## 2015-03-13 MED ORDER — FLUTICASONE PROPIONATE 50 MCG/ACT NA SUSP: 2.0000 | Freq: Every day | NASAL | Status: DC

## 2015-03-13 MED ORDER — CITALOPRAM HYDROBROMIDE 20 MG PO TABS: 20.0000 mg | ORAL_TABLET | Freq: Every day | ORAL | Status: DC

## 2015-03-13 NOTE — Patient Instructions (Addendum)
Use over the counter saline nose spray twice daily Our office will contact you regarding chest xray results Schedule next office visit as medicare wellness exam Return within 1-2 months for high dose flu vaccine

## 2015-03-13 NOTE — Progress Notes (Signed)
Subjective:    Patient ID: Linda Bentley, female    DOB: 1929-07-08, 81 y.o.   MRN: [id number]  HPI  81 year old white female with history of chronic renal insufficiency, prediabetes and hypertension for routine follow-up.  Interval medical history-patient reports she was seen by her nephrologist. She was advised to resume taking full dose of Lasix 20 mg once daily due to lower extremity swelling.  Patient complains of intermittent cough over the last 2 weeks. She also complains of fullness sensation in her sinuses. Cough is nonproductive. She denies shortness of breath. She has experienced generalized fatigue during the last 2 weeks.  Lastly, patient reports screening mammogram completed in April 2016. It was negative for any signs of malignancy but noted dense breast tissue. She denies family history of breast cancer.  Review of Systems Intermittent difficulty swallowing (no change), no shortness of breath    Past Medical History  Diagnosis Date  . Depression   . GERD (gastroesophageal reflux disease)   . Hyperlipidemia   . Hypertension   . Osteopenia   . Renal insufficiency 2007    creat 1.3  . Hiatal hernia   . LBP (low back pain)   . UNSPECIFIED PERIPHERAL VASCULAR DISEASE 06/08/2010  . ANEMIA-UNSPECIFIED 07/03/2009  . HYPERGLYCEMIA 02/26/2007  . GERD 02/26/2007    History   Social History  . Marital Status: Widowed    Spouse Name: N/A  . Number of Children: 0  . Years of Education: N/A   Occupational History  . Retired    Social History Main Topics  . Smoking status: Never Smoker   . Smokeless tobacco: Not on file  . Alcohol Use: No  . Drug Use: No  . Sexual Activity: Not on file   Other Topics Concern  . Not on file   Social History Narrative   Physician roster:      Nephrologist-Dr. Lyda Kalata   Ophthalmologists-Dr. [place] specialist-Dr. Dara Lords - husband passed 21 years ago.  No children    Past Surgical History   Procedure Laterality Date  . Appendectomy  1962  . Cholecystectomy    . Oophorectomy  1962  . Parathyroidectomy    . Dilation and curettage of uterus  1962  . Wrist fracture surgery  10/01  . Tail bone surgery    . Esophagogastroduodenoscopy      with dilatation Dr Fuller Plan  . Tonsillectomy and adenoidectomy    . Ingrown toenails       x3    Family History  Problem Relation Age of Onset  . Kidney disease Father     failure  . Colon cancer Sister     Allergies  Allergen Reactions  . Diltiazem Hcl Other (See Comments)    REACTION: feeling drunk    Current Outpatient Prescriptions on File Prior to Visit  Medication Sig Dispense Refill  . atorvastatin (LIPITOR) 40 MG tablet Take 1 tablet (40 mg total) by mouth daily. 90 tablet 1  . atorvastatin (LIPITOR) 40 MG tablet TAKE 1 TABLET BY MOUTH ONCE DAILY. 30 tablet 5  . calcium carbonate (TUMS - DOSED IN MG ELEMENTAL CALCIUM) 500 MG chewable tablet Chew 1 tablet by mouth daily as needed for heartburn.    . citalopram (CELEXA) 20 MG tablet TAKE 1 TABLET BY MOUTH DAILY. 90 tablet 3  . furosemide (LASIX) 20 MG tablet Take 1 tablet (20 mg total) by mouth daily. 90 tablet 1  . labetalol (NORMODYNE)  100 MG tablet TAKE 1 TABLET BY MOUTH DAILY. 90 tablet 2  . LORazepam (ATIVAN) 0.5 MG tablet TAKE 1 TABLET BY MOUTH DAILY AS NEEDED FOR ANXIETY. 30 tablet 0  . losartan (COZAAR) 25 MG tablet TAKE 1 TABLET BY MOUTH DAILY. 30 tablet 11  . meclizine (ANTIVERT) 12.5 MG tablet TAKE 1 TABLET BY MOUTH AS NEEDED. 30 tablet 5  . Multiple Vitamin (MULTIVITAMIN) tablet Take 1 tablet by mouth daily.      Marland Kitchen omeprazole (PRILOSEC) 40 MG capsule TAKE 1 CAPSULE BY MOUTH DAILY. 90 capsule 3  . oxybutynin (DITROPAN XL) 15 MG 24 hr tablet Take 15 mg by mouth at bedtime.    . polyethylene glycol powder (GLYCOLAX/MIRALAX) powder Take 17 g by mouth 2 (two) times daily as needed. 3350 g 3   No current facility-administered medications on file prior to visit.    BP  136/66 mmHg  Temp(Src) 97.9 F (36.6 C) (Oral)  Ht 5' 0.25" (1.53 m)  Wt 206 lb 3.2 oz (93.532 kg)  BMI 39.96 kg/m2  Wt Readings from Last 3 Encounters:  03/13/15 206 lb 3.2 oz (93.532 kg)  11/09/14 213 lb (96.616 kg)  10/10/14 215 lb (97.523 kg)    Objective:   Physical Exam  Constitutional: She is oriented to person, place, and time. She appears well-developed and well-nourished. No distress.  HENT:  Head: Normocephalic and atraumatic.  Right Ear: External ear normal.  Left Ear: External ear normal.  Mouth/Throat: Oropharynx is clear and moist.  Neck: Neck supple.  Cardiovascular: Normal rate, regular rhythm and normal heart sounds.   No murmur heard. Pulmonary/Chest: Effort normal and breath sounds normal. She has no wheezes.  Musculoskeletal: She exhibits edema.  Lymphadenopathy:    She has no cervical adenopathy.  Neurological: She is alert and oriented to person, place, and time. No cranial nerve deficit.  Ambulates with single prong cane  Skin: Skin is warm and dry.  Psychiatric: She has a normal mood and affect. Her behavior is normal.         Assessment & Plan:   1. Cough 2. Generalized fatigue / Head fullness 3. Hypertension 4. Screening for breast cancer  Patient reports intermittent cough, head fullness and fatigue last 2 weeks. Obtain chest x-ray to rule out pneumonia. I suspect her symptoms may be secondary to allergic rhinitis. Trial of Flonase and intranasal saline.  Patient's blood pressure stable.  Monitor BMET.  Patient resumed Lasix 20 mg daily as instructed by her nephrologist.  Patient had breast cancer screening with digital mammogram in April 2016. Her mammogram was unremarkable but dense breast tissue was noted. She oes not have any family history of breast cancer. We discussed pros and cons of breast cancer screening after age 88.  She elects to continue screening.

## 2015-03-13 NOTE — Progress Notes (Signed)
Pre visit review using our clinic review tool, if applicable. No additional management support is needed unless otherwise documented below in the visit note. 

## 2015-04-05 ENCOUNTER — Telehealth: Payer: Self-pay

## 2015-04-05 ENCOUNTER — Other Ambulatory Visit: Payer: Self-pay | Admitting: Internal Medicine

## 2015-04-05 NOTE — Telephone Encounter (Signed)
Patient would like a refill of Lorazepam 0.5 mg.  Please advise.

## 2015-04-06 NOTE — Telephone Encounter (Signed)
Looks like she was given rx in August with 3 RFs.  Please check with pharmacy

## 2015-05-08 ENCOUNTER — Emergency Department (HOSPITAL_COMMUNITY): Payer: Medicare Other

## 2015-05-08 ENCOUNTER — Encounter (HOSPITAL_COMMUNITY): Payer: Self-pay | Admitting: *Deleted

## 2015-05-08 ENCOUNTER — Emergency Department (HOSPITAL_COMMUNITY)
Admission: EM | Admit: 2015-05-08 | Discharge: 2015-05-08 | Disposition: A | Discharge status: Home / Self Care | Payer: Medicare Other | Attending: Emergency Medicine | Admitting: Emergency Medicine

## 2015-05-08 DIAGNOSIS — S0993XA Unspecified injury of face, initial encounter: Secondary | ICD-10-CM | POA: Diagnosis present

## 2015-05-08 DIAGNOSIS — Y9289 Other specified places as the place of occurrence of the external cause: Secondary | ICD-10-CM | POA: Diagnosis not present

## 2015-05-08 DIAGNOSIS — K219 Gastro-esophageal reflux disease without esophagitis: Secondary | ICD-10-CM | POA: Insufficient documentation

## 2015-05-08 DIAGNOSIS — W01190A Fall on same level from slipping, tripping and stumbling with subsequent striking against furniture, initial encounter: Secondary | ICD-10-CM | POA: Diagnosis not present

## 2015-05-08 DIAGNOSIS — Y9389 Activity, other specified: Secondary | ICD-10-CM | POA: Diagnosis not present

## 2015-05-08 DIAGNOSIS — Z862 Personal history of diseases of the blood and blood-forming organs and certain disorders involving the immune mechanism: Secondary | ICD-10-CM | POA: Insufficient documentation

## 2015-05-08 DIAGNOSIS — I10 Essential (primary) hypertension: Secondary | ICD-10-CM | POA: Diagnosis not present

## 2015-05-08 DIAGNOSIS — Z79899 Other long term (current) drug therapy: Secondary | ICD-10-CM | POA: Diagnosis not present

## 2015-05-08 DIAGNOSIS — Y998 Other external cause status: Secondary | ICD-10-CM | POA: Insufficient documentation

## 2015-05-08 DIAGNOSIS — M545 Low back pain: Secondary | ICD-10-CM | POA: Diagnosis not present

## 2015-05-08 DIAGNOSIS — S0510XA Contusion of eyeball and orbital tissues, unspecified eye, initial encounter: Secondary | ICD-10-CM | POA: Diagnosis not present

## 2015-05-08 DIAGNOSIS — S0181XA Laceration without foreign body of other part of head, initial encounter: Secondary | ICD-10-CM | POA: Insufficient documentation

## 2015-05-08 DIAGNOSIS — E785 Hyperlipidemia, unspecified: Secondary | ICD-10-CM | POA: Insufficient documentation

## 2015-05-08 DIAGNOSIS — F329 Major depressive disorder, single episode, unspecified: Secondary | ICD-10-CM | POA: Diagnosis not present

## 2015-05-08 DIAGNOSIS — W19XXXA Unspecified fall, initial encounter: Secondary | ICD-10-CM

## 2015-05-08 DIAGNOSIS — S199XXA Unspecified injury of neck, initial encounter: Secondary | ICD-10-CM | POA: Diagnosis not present

## 2015-05-08 DIAGNOSIS — S3992XA Unspecified injury of lower back, initial encounter: Secondary | ICD-10-CM | POA: Diagnosis not present

## 2015-05-08 DIAGNOSIS — Z87448 Personal history of other diseases of urinary system: Secondary | ICD-10-CM | POA: Insufficient documentation

## 2015-05-08 DIAGNOSIS — Z7951 Long term (current) use of inhaled steroids: Secondary | ICD-10-CM | POA: Diagnosis not present

## 2015-05-08 MED ORDER — IBUPROFEN 800 MG PO TABS: 800.0000 mg | ORAL_TABLET | Freq: Three times a day (TID) | ORAL | Status: DC

## 2015-05-08 NOTE — Discharge Instructions (Signed)

## 2015-05-08 NOTE — ED Notes (Addendum)
Pt reports "stumbled" and fell hitting L side of her face on the table.  Pt denies LOC at this time.  Bruising noted under her L eye and L facial area with swelling.   Pt also reports mid back pain.

## 2015-05-08 NOTE — ED Provider Notes (Signed)
CSN: [id number]     Arrival date & time 05/08/15  1441 History   First MD Initiated Contact with Patient 05/08/15 1752     Chief Complaint  Patient presents with  . Fall     (Consider location/radiation/quality/duration/timing/severity/associated sxs/prior Treatment) HPI  The patient is an 81 year old female who states that she lost her balance when she tried to sit down striking her left face on the edge of a table and falling to the ground landing on her buttocks. She complains of low back pain as well as some contusion and hematoma to the left face. No loss of consciousness, no neck pain, no numbness or weakness, she was ambulatory after the fall. The symptoms are persistent, worse with palpation  Past Medical History  Diagnosis Date  . Depression   . GERD (gastroesophageal reflux disease)   . Hyperlipidemia   . Hypertension   . Osteopenia   . Renal insufficiency 2007    creat 1.3  . Hiatal hernia   . LBP (low back pain)   . UNSPECIFIED PERIPHERAL VASCULAR DISEASE 06/08/2010  . ANEMIA-UNSPECIFIED 07/03/2009  . HYPERGLYCEMIA 02/26/2007  . GERD 02/26/2007   Past Surgical History  Procedure Laterality Date  . Appendectomy  1962  . Cholecystectomy    . Oophorectomy  1962  . Parathyroidectomy    . Dilation and curettage of uterus  1962  . Wrist fracture surgery  10/01  . Tail bone surgery    . Esophagogastroduodenoscopy      with dilatation Dr Fuller Plan  . Tonsillectomy and adenoidectomy    . Ingrown toenails       x3   Family History  Problem Relation Age of Onset  . Kidney disease Father     failure  . Colon cancer Sister    Social History  Substance Use Topics  . Smoking status: Never Smoker   . Smokeless tobacco: None  . Alcohol Use: No   OB History    No data available     Review of Systems  All other systems reviewed and are negative.     Allergies  Diltiazem hcl  Home Medications   Prior to Admission medications   Medication Sig Start Date End  Date Taking? Authorizing Provider  atorvastatin (LIPITOR) 40 MG tablet Take 1 tablet (40 mg total) by mouth daily. 11/09/14  Yes Doe-Hyun R Shawna Orleans, DO  calcium carbonate (TUMS - DOSED IN MG ELEMENTAL CALCIUM) 500 MG chewable tablet Chew 1 tablet by mouth daily as needed for heartburn.   Yes Historical Provider, MD  citalopram (CELEXA) 20 MG tablet Take 1 tablet (20 mg total) by mouth daily. 03/13/15  Yes Doe-Hyun R Shawna Orleans, DO  fluticasone (FLONASE) 50 MCG/ACT nasal spray Place 2 sprays into both nostrils daily. 03/13/15 03/12/16 Yes Doe-Hyun R Shawna Orleans, DO  furosemide (LASIX) 40 MG tablet Take 40 mg by mouth daily.  05/05/15  Yes Historical Provider, MD  labetalol (NORMODYNE) 100 MG tablet TAKE 1 TABLET BY MOUTH DAILY. 12/05/14  Yes Doe-Hyun R Shawna Orleans, DO  LORazepam (ATIVAN) 0.5 MG tablet TAKE 1 TABLET BY MOUTH DAILY AS NEEDED FOR ANXIETY Patient taking differently: TAKE 1 TABLET BY MOUTH AT BEDTIME 04/05/15  Yes Doe-Hyun R Yoo, DO  losartan (COZAAR) 25 MG tablet TAKE 1 TABLET BY MOUTH DAILY. 09/05/14  Yes Doe-Hyun Kyra Searles, DO  Multiple Vitamin (MULTIVITAMIN) tablet Take 1 tablet by mouth daily.     Yes Historical Provider, MD  omeprazole (PRILOSEC) 40 MG capsule TAKE 1 CAPSULE BY MOUTH DAILY.  06/03/14  Yes Bruce Kendall Flack, MD  oxybutynin (DITROPAN XL) 15 MG 24 hr tablet Take 15 mg by mouth at bedtime.   Yes Historical Provider, MD  polyethylene glycol powder (GLYCOLAX/MIRALAX) powder Take 17 g by mouth 2 (two) times daily as needed. 10/10/14  Yes Doe-Hyun R Shawna Orleans, DO  furosemide (LASIX) 20 MG tablet Take 1 tablet (20 mg total) by mouth daily. Patient not taking: Reported on 05/08/2015 11/09/14   Doe-Hyun R Shawna Orleans, DO  ibuprofen (ADVIL,MOTRIN) 800 MG tablet Take 1 tablet (800 mg total) by mouth 3 (three) times daily. 05/08/15   Noemi Chapel, MD  meclizine (ANTIVERT) 12.5 MG tablet TAKE 1 TABLET BY MOUTH AS NEEDED.    Darrick Penna Swords, MD   BP 149/57 mmHg  Pulse 64  Temp(Src) 98.7 F (37.1 C) (Oral)  Resp 18  SpO2 97% Physical Exam   Constitutional: She appears well-developed and well-nourished. No distress.  HENT:  Head: Normocephalic.  Mouth/Throat: Oropharynx is clear and moist. No oropharyngeal exudate.  No malocclusion, no dental injuries, no blood in the oropharynx or the nurse, tenderness over the left zygoma with hematoma and small 1 cm laceration without gaping  Eyes: Conjunctivae and EOM are normal. Pupils are equal, round, and reactive to light. Right eye exhibits no discharge. Left eye exhibits no discharge. No scleral icterus.  Normal extraocular movements, no disconjugate gaze, no entrapment, normal pupillary exam, no proptosis  Neck: Normal range of motion. Neck supple. No JVD present. No thyromegaly present.  Cardiovascular: Normal rate, regular rhythm, normal heart sounds and intact distal pulses.  Exam reveals no gallop and no friction rub.   No murmur heard. Pulmonary/Chest: Effort normal and breath sounds normal. No respiratory distress. She has no wheezes. She has no rales.  Abdominal: Soft. Bowel sounds are normal. She exhibits no distension and no mass. There is no tenderness.  Musculoskeletal: Normal range of motion. She exhibits tenderness (tenderness over the lumbar spine). She exhibits no edema.  Lymphadenopathy:    She has no cervical adenopathy.  Neurological: She is alert. Coordination normal.  Able to straight leg raise bilaterally without difficulty normal sensation to the bilateral lower extremities  Skin: Skin is warm and dry. No rash noted. No erythema.  Psychiatric: She has a normal mood and affect. Her behavior is normal.  Nursing note and vitals reviewed.   ED Course  Procedures (including critical care time) Labs Review Labs Reviewed - No data to display  Imaging Review Dg Lumbar Spine Complete  05/08/2015   CLINICAL DATA:  Tripped over a road in the home today and fell, landing on the left side. Lower back pain.  EXAM: LUMBAR SPINE - COMPLETE 4+ VIEW  COMPARISON:  MRI  04/12/2010  FINDINGS: Alignment is normal. No evidence of fracture. Minimal disc space narrowing at L3-4. Ordinary lower lumbar facet degeneration.  IMPRESSION: No acute or traumatic finding. Mild disc space narrowing L3-4. Ordinary lower lumbar facet osteoarthritis.   Electronically Signed   By: Nelson Chimes M.D.   On: 05/08/2015 15:46   Ct Head Wo Contrast  05/08/2015   CLINICAL DATA:  Fall. Hit left-sided face on table. No loss of consciousness. Left periorbital swelling.  EXAM: CT HEAD WITHOUT CONTRAST  CT MAXILLOFACIAL WITHOUT CONTRAST  CT CERVICAL SPINE WITHOUT CONTRAST  TECHNIQUE: Multidetector CT imaging of the head, cervical spine, and maxillofacial structures were performed using the standard protocol without intravenous contrast. Multiplanar CT image reconstructions of the cervical spine and maxillofacial structures were also generated.  COMPARISON:  CT of the head and cervical spine 02/19/2010.  FINDINGS: CT HEAD FINDINGS  A left infraorbital hematoma is present. There is no underlying fracture. The paranasal sinuses and mastoid air cells are clear. Calvarium is intact. Atherosclerotic calcifications are present in the cavernous internal carotid arteries. Note is made of hyperostosis frontalis internus.  Mild atrophy and white matter disease is evident. No acute cortical infarct, hemorrhage, or mass lesion is present. The ventricles are of normal size. No significant extra-axial fluid collection is present.  CT MAXILLOFACIAL FINDINGS  The a left infraorbital hematoma is present. There is significant soft tissue swelling. There is no underlying fracture. Bilateral lens replacements are present. The globes and orbits are otherwise intact. Mucosal thickening is evident along the inferior aspect of the maxillary sinuses bilaterally, left greater than right. The ostiomeatal complex is patent. The remaining paranasal sinuses are clear. Nasal bones are intact. There is no significant facial fracture. The  mandible is intact and located. Degenerative changes are noted at the TMJ bilaterally.  CT CERVICAL SPINE FINDINGS  Cervical spine is imaged from the skullbase through T2-3. Vertebral body heights and alignment are maintained. No acute fracture or traumatic subluxation is evident. Asymmetric facet hypertrophy is present on the right at C2-3. Foraminal narrowing is worse on the left at C3-4 and on the right at C4-5. A calcified disc protrusion is noted at C5-6. Mild right foraminal narrowing at C6-7 is stable.  Medial deviation of the right internal carotid artery is noted. A dominant left thyroid lesion measures 16 mm, potentially increased in size. The lung apices are clear.  IMPRESSION: 1. Left infraorbital hematoma without underlying fracture. 2. Stable atrophy and white matter disease. No acute intracranial abnormality. 3. Stable spondylosis of the cervical spine as described. 4. Dominant left thyroid lesion. This may be increasing in size. Consider further evaluation with thyroid ultrasound. If patient is clinically hyperthyroid, consider nuclear medicine thyroid uptake and scan.   Electronically Signed   By: San Morelle M.D.   On: 05/08/2015 15:53   Ct Cervical Spine Wo Contrast  05/08/2015   CLINICAL DATA:  Fall. Hit left-sided face on table. No loss of consciousness. Left periorbital swelling.  EXAM: CT HEAD WITHOUT CONTRAST  CT MAXILLOFACIAL WITHOUT CONTRAST  CT CERVICAL SPINE WITHOUT CONTRAST  TECHNIQUE: Multidetector CT imaging of the head, cervical spine, and maxillofacial structures were performed using the standard protocol without intravenous contrast. Multiplanar CT image reconstructions of the cervical spine and maxillofacial structures were also generated.  COMPARISON:  CT of the head and cervical spine 02/19/2010.  FINDINGS: CT HEAD FINDINGS  A left infraorbital hematoma is present. There is no underlying fracture. The paranasal sinuses and mastoid air cells are clear. Calvarium is  intact. Atherosclerotic calcifications are present in the cavernous internal carotid arteries. Note is made of hyperostosis frontalis internus.  Mild atrophy and white matter disease is evident. No acute cortical infarct, hemorrhage, or mass lesion is present. The ventricles are of normal size. No significant extra-axial fluid collection is present.  CT MAXILLOFACIAL FINDINGS  The a left infraorbital hematoma is present. There is significant soft tissue swelling. There is no underlying fracture. Bilateral lens replacements are present. The globes and orbits are otherwise intact. Mucosal thickening is evident along the inferior aspect of the maxillary sinuses bilaterally, left greater than right. The ostiomeatal complex is patent. The remaining paranasal sinuses are clear. Nasal bones are intact. There is no significant facial fracture. The mandible is intact and located. Degenerative changes  are noted at the TMJ bilaterally.  CT CERVICAL SPINE FINDINGS  Cervical spine is imaged from the skullbase through T2-3. Vertebral body heights and alignment are maintained. No acute fracture or traumatic subluxation is evident. Asymmetric facet hypertrophy is present on the right at C2-3. Foraminal narrowing is worse on the left at C3-4 and on the right at C4-5. A calcified disc protrusion is noted at C5-6. Mild right foraminal narrowing at C6-7 is stable.  Medial deviation of the right internal carotid artery is noted. A dominant left thyroid lesion measures 16 mm, potentially increased in size. The lung apices are clear.  IMPRESSION: 1. Left infraorbital hematoma without underlying fracture. 2. Stable atrophy and white matter disease. No acute intracranial abnormality. 3. Stable spondylosis of the cervical spine as described. 4. Dominant left thyroid lesion. This may be increasing in size. Consider further evaluation with thyroid ultrasound. If patient is clinically hyperthyroid, consider nuclear medicine thyroid uptake and  scan.   Electronically Signed   By: San Morelle M.D.   On: 05/08/2015 15:53   Ct Maxillofacial Wo Cm  05/08/2015   CLINICAL DATA:  Fall. Hit left-sided face on table. No loss of consciousness. Left periorbital swelling.  EXAM: CT HEAD WITHOUT CONTRAST  CT MAXILLOFACIAL WITHOUT CONTRAST  CT CERVICAL SPINE WITHOUT CONTRAST  TECHNIQUE: Multidetector CT imaging of the head, cervical spine, and maxillofacial structures were performed using the standard protocol without intravenous contrast. Multiplanar CT image reconstructions of the cervical spine and maxillofacial structures were also generated.  COMPARISON:  CT of the head and cervical spine 02/19/2010.  FINDINGS: CT HEAD FINDINGS  A left infraorbital hematoma is present. There is no underlying fracture. The paranasal sinuses and mastoid air cells are clear. Calvarium is intact. Atherosclerotic calcifications are present in the cavernous internal carotid arteries. Note is made of hyperostosis frontalis internus.  Mild atrophy and white matter disease is evident. No acute cortical infarct, hemorrhage, or mass lesion is present. The ventricles are of normal size. No significant extra-axial fluid collection is present.  CT MAXILLOFACIAL FINDINGS  The a left infraorbital hematoma is present. There is significant soft tissue swelling. There is no underlying fracture. Bilateral lens replacements are present. The globes and orbits are otherwise intact. Mucosal thickening is evident along the inferior aspect of the maxillary sinuses bilaterally, left greater than right. The ostiomeatal complex is patent. The remaining paranasal sinuses are clear. Nasal bones are intact. There is no significant facial fracture. The mandible is intact and located. Degenerative changes are noted at the TMJ bilaterally.  CT CERVICAL SPINE FINDINGS  Cervical spine is imaged from the skullbase through T2-3. Vertebral body heights and alignment are maintained. No acute fracture or  traumatic subluxation is evident. Asymmetric facet hypertrophy is present on the right at C2-3. Foraminal narrowing is worse on the left at C3-4 and on the right at C4-5. A calcified disc protrusion is noted at C5-6. Mild right foraminal narrowing at C6-7 is stable.  Medial deviation of the right internal carotid artery is noted. A dominant left thyroid lesion measures 16 mm, potentially increased in size. The lung apices are clear.  IMPRESSION: 1. Left infraorbital hematoma without underlying fracture. 2. Stable atrophy and white matter disease. No acute intracranial abnormality. 3. Stable spondylosis of the cervical spine as described. 4. Dominant left thyroid lesion. This may be increasing in size. Consider further evaluation with thyroid ultrasound. If patient is clinically hyperthyroid, consider nuclear medicine thyroid uptake and scan.   Electronically Signed   By:  San Morelle M.D.   On: 05/08/2015 15:53   I have personally reviewed and evaluated these images and lab results as part of my medical decision-making.   EKG Interpretation None      MDM   Final diagnoses:  Facial laceration, initial encounter  Low back pain without sciatica, unspecified back pain laterality    Laceration repaired without difficulty, Dermabond, imaging negative for any fractures of the lumbar spine or the maxillofacial structures. Patient informed of the treatment plan, NSAIDs, home.  LACERATION REPAIR Performed by: Johnna Acosta Authorized by: Johnna Acosta Consent: Verbal consent obtained. Risks and benefits: risks, benefits and alternatives were discussed Consent given by: patient Patient identity confirmed: provided demographic data Prepped and Draped in normal sterile fashion Wound explored  Laceration Location: L face  Laceration Length: 1cm  No Foreign Bodies seen or palpated  Anesthesia: local infiltration  Local anesthetic: none    Irrigation method: syringe Amount of  cleaning: standard  Skin closure: Dermabond  Number of sutures: Dermabond  Technique: Dermabond  Patient tolerance: Patient tolerated the procedure well with no immediate complications.     Noemi Chapel, MD 05/08/15 1900

## 2015-05-23 DIAGNOSIS — R351 Nocturia: Secondary | ICD-10-CM | POA: Diagnosis not present

## 2015-05-23 DIAGNOSIS — N3281 Overactive bladder: Secondary | ICD-10-CM | POA: Diagnosis not present

## 2015-06-02 ENCOUNTER — Other Ambulatory Visit: Payer: Self-pay | Admitting: Internal Medicine

## 2015-06-19 ENCOUNTER — Ambulatory Visit (INDEPENDENT_AMBULATORY_CARE_PROVIDER_SITE_OTHER): Payer: Medicare Other | Admitting: Internal Medicine

## 2015-06-19 ENCOUNTER — Encounter: Payer: Self-pay | Admitting: Internal Medicine

## 2015-06-19 VITALS — BP 110/58 | Temp 98.2°F | Ht 60.25 in | Wt 194.2 lb

## 2015-06-19 DIAGNOSIS — R531 Weakness: Secondary | ICD-10-CM

## 2015-06-19 DIAGNOSIS — Z5181 Encounter for therapeutic drug level monitoring: Secondary | ICD-10-CM

## 2015-06-19 DIAGNOSIS — T148 Other injury of unspecified body region: Secondary | ICD-10-CM | POA: Diagnosis not present

## 2015-06-19 DIAGNOSIS — R17 Unspecified jaundice: Secondary | ICD-10-CM | POA: Diagnosis not present

## 2015-06-19 DIAGNOSIS — T148XXA Other injury of unspecified body region, initial encounter: Secondary | ICD-10-CM

## 2015-06-19 DIAGNOSIS — L299 Pruritus, unspecified: Secondary | ICD-10-CM | POA: Diagnosis not present

## 2015-06-19 DIAGNOSIS — R630 Anorexia: Secondary | ICD-10-CM

## 2015-06-19 LAB — POCT URINALYSIS DIP (MANUAL ENTRY)
GLUCOSE UA: NEGATIVE
Ketones, POC UA: NEGATIVE
NITRITE UA: NEGATIVE
PH UA: 6
RBC UA: NEGATIVE
Spec Grav, UA: 1.015
UROBILINOGEN UA: 1

## 2015-06-19 LAB — CBC WITH DIFFERENTIAL/PLATELET
BASOS ABS: 0 10*3/uL (ref 0.0–0.1)
Basophils Relative: 0.4 % (ref 0.0–3.0)
Eosinophils Absolute: 0.3 10*3/uL (ref 0.0–0.7)
Eosinophils Relative: 3.8 % (ref 0.0–5.0)
HCT: 33.6 % — ABNORMAL LOW (ref 36.0–46.0)
Hemoglobin: 11.1 g/dL — ABNORMAL LOW (ref 12.0–15.0)
LYMPHS ABS: 2.1 10*3/uL (ref 0.7–4.0)
Lymphocytes Relative: 29.7 % (ref 12.0–46.0)
MCHC: 33.1 g/dL (ref 30.0–36.0)
MCV: 91.6 fl (ref 78.0–100.0)
MONO ABS: 0.4 10*3/uL (ref 0.1–1.0)
MONOS PCT: 5.7 % (ref 3.0–12.0)
NEUTROS ABS: 4.3 10*3/uL (ref 1.4–7.7)
NEUTROS PCT: 60.4 % (ref 43.0–77.0)
PLATELETS: 279 10*3/uL (ref 150.0–400.0)
RBC: 3.67 Mil/uL — AB (ref 3.87–5.11)
RDW: 13.5 % (ref 11.5–15.5)
WBC: 7.1 10*3/uL (ref 4.0–10.5)

## 2015-06-19 LAB — TSH: TSH: 1.86 u[IU]/mL (ref 0.35–4.50)

## 2015-06-19 LAB — PROTIME-INR
INR: 1.1 ratio — AB (ref 0.8–1.0)
Prothrombin Time: 12.7 s (ref 9.6–13.1)

## 2015-06-19 LAB — HEPATIC FUNCTION PANEL
ALBUMIN: 4.1 g/dL (ref 3.5–5.2)
ALK PHOS: 1152 U/L — AB (ref 39–117)
ALT: 182 U/L — ABNORMAL HIGH (ref 0–35)
AST: 231 U/L — AB (ref 0–37)
BILIRUBIN TOTAL: 4.2 mg/dL — AB (ref 0.2–1.2)
Bilirubin, Direct: 2.7 mg/dL — ABNORMAL HIGH (ref 0.0–0.3)
Total Protein: 7 g/dL (ref 6.0–8.3)

## 2015-06-19 LAB — BASIC METABOLIC PANEL
BUN: 41 mg/dL — AB (ref 6–23)
CALCIUM: 9.8 mg/dL (ref 8.4–10.5)
CO2: 29 meq/L (ref 19–32)
CREATININE: 2.13 mg/dL — AB (ref 0.40–1.20)
Chloride: 98 mEq/L (ref 96–112)
GFR: 23.46 mL/min — AB (ref >60.00)
GLUCOSE: 183 mg/dL — AB (ref 70–99)
Potassium: 5 mEq/L (ref 3.5–5.1)
SODIUM: 141 meq/L (ref 135–145)

## 2015-06-19 LAB — SEDIMENTATION RATE: SED RATE: 114 mm/h — AB (ref 0–22)

## 2015-06-19 LAB — T4, FREE: FREE T4: 0.89 ng/dL (ref 0.60–1.60)

## 2015-06-19 NOTE — Patient Instructions (Signed)
Lab tests today and we may get an abdominal ultrasound tests to check your liver . Lets see what the blood tests show first.  If the blood pressure remains low we  May decrease your blood pressure medication   If tyou are taking   Ibuprofen  Or aleve please   Stop this as this can effect your liver and kidney function.   BP Readings from Last 3 Encounters:  06/19/15 110/58  05/08/15 149/57  03/13/15 136/66   For  Now no med to  [place] fu visit depending on lab tests  Etc

## 2015-06-19 NOTE — Progress Notes (Signed)
Pre visit review using our clinic review tool, if applicable. No additional management support is needed unless otherwise documented below in the visit note.  Chief Complaint  Patient presents with  . Fatigue    x2WKS  . Dizziness    HPI: Patient TENNILLE Bentley  comes in today for SDA for  new problem evaluation.  PCP  Dr Shawna Orleans NA  Today .Marland Kitchen  Here with sis in law  2 week hx of  Decrease appetite weigh tloss feeling weak and tired and that is not like her  Lives alone .  Independent calls  Family if needed help. No abd pain but vomited one tim 2 weeks ago  Took ibu x 6 after a fall in October ed  Not now. Sees urologist and all ok .  recenet began itching  badly and although  denies bruising says that  That is from scratching gos hard    Has noted some yellow flush skin in am  ROS: See pertinent positives and negatives per HPI.  Inc urination on diuretics  Leg edema like her whole life  No syncope cp sob changes   Past Medical History  Diagnosis Date  . Depression   . GERD (gastroesophageal reflux disease)   . Hyperlipidemia   . Hypertension   . Osteopenia   . Renal insufficiency 2007    creat 1.3  . Hiatal hernia   . LBP (low back pain)   . UNSPECIFIED PERIPHERAL VASCULAR DISEASE 06/08/2010  . ANEMIA-UNSPECIFIED 07/03/2009  . HYPERGLYCEMIA 02/26/2007  . GERD 02/26/2007    Family History  Problem Relation Age of Onset  . Kidney disease Father     failure  . Colon cancer Sister     Social History   Social History  . Marital Status: Widowed    Spouse Name: N/A  . Number of Children: 0  . Years of Education: N/A   Occupational History  . Retired    Social History Main Topics  . Smoking status: Never Smoker   . Smokeless tobacco: None  . Alcohol Use: No  . Drug Use: No  . Sexual Activity: Not Asked   Other Topics Concern  . None   Social History Narrative   Physician roster:      Nephrologist-Dr. Lyda Kalata   Ophthalmologists-Dr. Melissa Noon   Orthopedic  specialist-Dr. Dara Lords - husband passed 21 years ago.  No children    Outpatient Prescriptions Prior to Visit  Medication Sig Dispense Refill  . atorvastatin (LIPITOR) 40 MG tablet Take 1 tablet (40 mg total) by mouth daily. 90 tablet 1  . calcium carbonate (TUMS - DOSED IN MG ELEMENTAL CALCIUM) 500 MG chewable tablet Chew 1 tablet by mouth daily as needed for heartburn.    . citalopram (CELEXA) 20 MG tablet Take 1 tablet (20 mg total) by mouth daily. 90 tablet 1  . citalopram (CELEXA) 20 MG tablet TAKE 1 TABLET BY MOUTH DAILY. 90 tablet 1  . fluticasone (FLONASE) 50 MCG/ACT nasal spray Place 2 sprays into both nostrils daily. 16 g 1  . furosemide (LASIX) 20 MG tablet Take 1 tablet (20 mg total) by mouth daily. 90 tablet 1  . ibuprofen (ADVIL,MOTRIN) 800 MG tablet Take 1 tablet (800 mg total) by mouth 3 (three) times daily. 21 tablet 0  . labetalol (NORMODYNE) 100 MG tablet TAKE 1 TABLET BY MOUTH DAILY. 90 tablet 2  . LORazepam (ATIVAN) 0.5 MG tablet TAKE 1 TABLET BY MOUTH DAILY  AS NEEDED FOR ANXIETY (Patient taking differently: TAKE 1 TABLET BY MOUTH AT BEDTIME) 30 tablet 3  . losartan (COZAAR) 25 MG tablet TAKE 1 TABLET BY MOUTH DAILY. 30 tablet 11  . meclizine (ANTIVERT) 12.5 MG tablet TAKE 1 TABLET BY MOUTH AS NEEDED. 30 tablet 5  . Multiple Vitamin (MULTIVITAMIN) tablet Take 1 tablet by mouth daily.      Marland Kitchen omeprazole (PRILOSEC) 40 MG capsule TAKE 1 CAPSULE BY MOUTH DAILY. 90 capsule 1  . oxybutynin (DITROPAN XL) 15 MG 24 hr tablet Take 15 mg by mouth at bedtime.    . polyethylene glycol powder (GLYCOLAX/MIRALAX) powder Take 17 g by mouth 2 (two) times daily as needed. 3350 g 3  . furosemide (LASIX) 40 MG tablet Take 40 mg by mouth daily.   6   No facility-administered medications prior to visit.     EXAM:  BP 110/58 mmHg  Temp(Src) 98.2 F (36.8 C) (Oral)  Ht 5' 0.25" (1.53 m)  Wt 194 lb 3.2 oz (88.089 kg)  BMI 37.63 kg/m2  Body mass index is 37.63  kg/(m^2).  GENERAL: vitals reviewed and listed above, alert, oriented, appears well hydrated and in no acute distress looks  mildy jaundice or pasty  Pleasant and non toxic  HEENT: atraumatic, conjunctiva  clear, no obvious abnormalities on inspection of external nose and ears OP : no lesion edema or exudate tongue midline  NECK: no obvious masses on inspection palpation supple   LUNGS: clear to auscultation bilaterally, no wheezes, rales or rhonchi, good air movement Abdomen:  Sof,t protuberant non tender  normal bowel sounds / heap ar rcm , no guarding rebound or masses no CVA tenderness Skin bruising alon right lateral chest wall and upper abd and left lower abd  ( pt says from scratching so hard)  CV: HRRR, no clubbing cyanosis or   3+ larege legs  No obv lesions  peripheranl cap refill  MS: moves all extremities without noticeable focal  abnormality PSYCH: pleasant and cooperative, no obvious depression or anxiety ambulatory with cane    ASSESSMENT AND PLAN:  Discussed the following assessment and plan:  Weakness - Plan: Basic metabolic panel, CBC with Differential/Platelet, Hepatic function panel, TSH, POCT urinalysis dipstick, Sedimentation rate, T4, free, Protime-INR  Decreased appetite - Plan: Basic metabolic panel, CBC with Differential/Platelet, Hepatic function panel, TSH, POCT urinalysis dipstick, Sedimentation rate, T4, free, Protime-INR  Itching - Plan: Basic metabolic panel, CBC with Differential/Platelet, Hepatic function panel, TSH, POCT urinalysis dipstick, Sedimentation rate, T4, free, Protime-INR  Bruising - Plan: Basic metabolic panel, CBC with Differential/Platelet, Hepatic function panel, TSH, POCT urinalysis dipstick, Sedimentation rate, T4, free, Protime-INR  Jaundice Renal insuff  Anemia    Hx depression anxiety  lorazepam use  Rare   Above sx concerning for biliary obstructive disease    Will notify you  of labs when available. And then abd imagine as  appropriate .  -Patient advised to return or notify health care team  if symptoms worsen ,persist or new concerns arise.  Patient Instructions   Lab tests today and we may get an abdominal ultrasound tests to check your liver . Lets see what the blood tests show first.  If the blood pressure remains low we  May decrease your blood pressure medication   If tyou are taking   Ibuprofen  Or aleve please   Stop this as this can effect your liver and kidney function.   BP Readings from Last 3 Encounters:  06/19/15 110/58  05/08/15 149/57  03/13/15 136/66   For  Now no med to  [place] fu visit depending on lab tests  Isac Caddy K. Brandilynn Taormina M.D.  Lab Results  Component Value Date   WBC 7.1 06/19/2015   HGB 11.1* 06/19/2015   HCT 33.6* 06/19/2015   PLT 279.0 06/19/2015   GLUCOSE 183* 06/19/2015   CHOL 121 10/10/2014   TRIG 145.0 10/10/2014   HDL 46.90 10/10/2014   LDLDIRECT 128.5 12/31/2011   LDLCALC 45 10/10/2014   ALT 182* 06/19/2015   AST 231* 06/19/2015   NA 141 06/19/2015   K 5.0 06/19/2015   CL 98 06/19/2015   CREATININE 2.13* 06/19/2015   BUN 41* 06/19/2015   CO2 29 06/19/2015   TSH 1.86 06/19/2015   INR 1.1* 06/19/2015   HGBA1C 5.9 03/13/2015  labs consistent with obstructive   Biliary disease   Will need  abd imaging   Ct w/o  3 cm pancreatic mass obst bile ducts . Will get gi involved .  Total visit 40 mins > 50% spent counseling and coordinating care as indicated in above note and in instructions to patient .

## 2015-06-20 ENCOUNTER — Ambulatory Visit (INDEPENDENT_AMBULATORY_CARE_PROVIDER_SITE_OTHER)
Admission: RE | Admit: 2015-06-20 | Discharge: 2015-06-20 | Disposition: A | Discharge status: Home / Self Care | Payer: Medicare Other | Source: Ambulatory Visit | Attending: Internal Medicine | Admitting: Internal Medicine

## 2015-06-20 ENCOUNTER — Other Ambulatory Visit: Payer: Self-pay | Admitting: Family Medicine

## 2015-06-20 DIAGNOSIS — N289 Disorder of kidney and ureter, unspecified: Secondary | ICD-10-CM | POA: Diagnosis not present

## 2015-06-20 DIAGNOSIS — R748 Abnormal levels of other serum enzymes: Secondary | ICD-10-CM

## 2015-06-20 DIAGNOSIS — R17 Unspecified jaundice: Secondary | ICD-10-CM | POA: Diagnosis not present

## 2015-06-21 ENCOUNTER — Telehealth: Payer: Self-pay

## 2015-06-21 ENCOUNTER — Encounter: Payer: Self-pay | Admitting: Internal Medicine

## 2015-06-21 ENCOUNTER — Other Ambulatory Visit: Payer: Self-pay

## 2015-06-21 ENCOUNTER — Telehealth: Payer: Self-pay | Admitting: Gastroenterology

## 2015-06-21 ENCOUNTER — Ambulatory Visit (INDEPENDENT_AMBULATORY_CARE_PROVIDER_SITE_OTHER): Payer: Medicare Other | Admitting: Internal Medicine

## 2015-06-21 VITALS — BP 136/62 | Temp 98.2°F | Ht 60.25 in | Wt 193.1 lb

## 2015-06-21 DIAGNOSIS — M1990 Unspecified osteoarthritis, unspecified site: Secondary | ICD-10-CM

## 2015-06-21 DIAGNOSIS — R2681 Unsteadiness on feet: Secondary | ICD-10-CM

## 2015-06-21 DIAGNOSIS — R531 Weakness: Secondary | ICD-10-CM | POA: Diagnosis not present

## 2015-06-21 DIAGNOSIS — K831 Obstruction of bile duct: Secondary | ICD-10-CM

## 2015-06-21 DIAGNOSIS — N183 Chronic kidney disease, stage 3 unspecified: Secondary | ICD-10-CM

## 2015-06-21 DIAGNOSIS — K838 Other specified diseases of biliary tract: Secondary | ICD-10-CM

## 2015-06-21 DIAGNOSIS — Z7409 Other reduced mobility: Secondary | ICD-10-CM

## 2015-06-21 DIAGNOSIS — K869 Disease of pancreas, unspecified: Secondary | ICD-10-CM

## 2015-06-21 DIAGNOSIS — K8689 Other specified diseases of pancreas: Secondary | ICD-10-CM

## 2015-06-21 NOTE — Telephone Encounter (Signed)
Linda Bentley figured this out on our end.  Let [name] know she will be having an ERCP with Dr. Fuller Plan tomorrow morning (thursday) at [facility]. The case will start around 7:30, she probably has to be there around [place] from our office will plan to contact her this afternoon (after her office appt with you) to give her all the details of the appt.  Plan is to place a stent in the bile duct to relieve the jaundice and also sample the bile duct with brushing.  Thanks

## 2015-06-21 NOTE — Patient Instructions (Addendum)
You will have a procedure  Tomorrow at 730 at [facility] long tomorrow. GI  Should  Contact you about details but may need to be there at 6 am .  procredure is called ercp with stent placement  Endoscopic Retrograde Cholangiopancreatography (ERCP) Endoscopic retrograde cholangiopancreatography (ERCP) is a procedure used to diagnosis many diseases of the pancreas, bile ducts, liver, and gallbladder. During ERCP a thin, lighted tube (endoscope) is passed through the mouth and down the back of the throat into the first part of the small intestine (duodenum). A small, plastic tube (cannula) is then passed through the endoscope and directed into the bile duct or pancreatic duct. Dye is then injected through the cannula and X-rays are taken to study the biliary and pancreatic passageways.  LET [facility] CARE PROVIDER KNOW ABOUT:   Any allergies you have.   All medicines you are taking, including vitamins, herbs, eyedrops, creams, and over-the-counter medicines.   Previous problems you or members of your family have had with the use of anesthetics.   Any blood disorders you have.   Previous surgeries you have had.   Medical conditions you have. RISKS AND COMPLICATIONS Generally, ERCP is a safe procedure. However, as with any procedure, complications can occur. A simple removal of gallstones has the lowest rate of complications. Higher rates of complication occur in people who have poorly functioning bile or pancreatic ducts. Possible complications include:   Pancreatitis.  Bleeding.  Accidental punctures in the bowel wall, pancreas, or gall bladder.  Gall bladder or bile duct infection. BEFORE THE PROCEDURE   Do not eat or drink anything, including water, for at least 8 hours before the procedure or as directed by your health care provider.   Ask your health care provider whether you should stop taking certain medicines prior to your procedure.   Arrange for someone to drive you  home. You will not be allowed to drive for [id number] hours after the procedure. PROCEDURE   You will be given medicine through a vein (intravenously) to make you relaxed and sleepy.   You might have a breathing tube placed to give you medicine that makes you sleep (general anesthetic).   Your throat may be sprayed with medicine that numbs the area and prevents gagging (local anesthetic), or you may gargle this medicine.   You will lie on your left side.   The endoscope will be inserted through your mouth and into the duodenum. The tube will not interfere with your breathing. Gagging is prevented by the anesthesia.   While X-rays are being taken, you may be positioned on your stomach.   A small sample of tissue (biopsy) may be removed for examination. AFTER THE PROCEDURE   You will rest in bed until you are fully conscious.   When you first wake up, your throat may feel slightly sore.   You will not be allowed to eat or drink until numbness subsides.   Once you are able to drink, urinate, and sit on the edge of the bed without feeling sick to your stomach (nauseous) or dizzy, you may be allowed to go home.   This information is not intended to replace advice given to you by your health care provider. Make sure you discuss any questions you have with your health care provider.   Document Released: 04/16/2001 Document Revised: 05/12/2013 Document Reviewed: 03/02/2013 Elsevier Interactive Patient Education [employer].

## 2015-06-21 NOTE — Telephone Encounter (Signed)
See additional phone note from Dr. Ardis Hughs for details.

## 2015-06-21 NOTE — Telephone Encounter (Signed)
I spoke with patient and explained all instructions for ERCP tomorrow.  She verbalized understandign to arrive in admitting

## 2015-06-21 NOTE — Progress Notes (Signed)
Pre visit review using our clinic review tool, if applicable. No additional management support is needed unless otherwise documented below in the visit note.   Chief Complaint  Patient presents with  . Follow-up    HPI: Here with brother  For   Results of labs and scans  Still dizzy headache at times .   No vomiting still itching   bp ok though  Feels more weak  .    Asks for rx for 3 point walker with wheel   Someone called her [facility]  About procedure she didn't yet nowabout. ROS: See pertinent positives and negatives per HPI.  Past Medical History  Diagnosis Date  . Depression   . GERD (gastroesophageal reflux disease)   . Hyperlipidemia   . Hypertension   . Osteopenia   . Renal insufficiency 2007    creat 1.3  . Hiatal hernia   . LBP (low back pain)   . UNSPECIFIED PERIPHERAL VASCULAR DISEASE 06/08/2010  . ANEMIA-UNSPECIFIED 07/03/2009  . HYPERGLYCEMIA 02/26/2007  . GERD 02/26/2007    Family History  Problem Relation Age of Onset  . Kidney disease Father     failure  . Colon cancer Sister     Social History   Social History  . Marital Status: Widowed    Spouse Name: N/A  . Number of Children: 0  . Years of Education: N/A   Occupational History  . Retired    Social History Main Topics  . Smoking status: Never Smoker   . Smokeless tobacco: None  . Alcohol Use: No  . Drug Use: No  . Sexual Activity: Not Asked   Other Topics Concern  . None   Social History Narrative   Physician roster:      Nephrologist-Dr. Lyda Kalata   Ophthalmologists-Dr. Melissa Noon   Orthopedic specialist-Dr. Dara Lords - husband passed 21 years ago.  No children    Outpatient Prescriptions Prior to Visit  Medication Sig Dispense Refill  . atorvastatin (LIPITOR) 40 MG tablet Take 1 tablet (40 mg total) by mouth daily. 90 tablet 1  . calcium carbonate (TUMS - DOSED IN MG ELEMENTAL CALCIUM) 500 MG chewable tablet Chew 1 tablet by mouth daily as needed for  heartburn.    . citalopram (CELEXA) 20 MG tablet Take 1 tablet (20 mg total) by mouth daily. 90 tablet 1  . citalopram (CELEXA) 20 MG tablet TAKE 1 TABLET BY MOUTH DAILY. 90 tablet 1  . fluticasone (FLONASE) 50 MCG/ACT nasal spray Place 2 sprays into both nostrils daily. 16 g 1  . furosemide (LASIX) 20 MG tablet Take 1 tablet (20 mg total) by mouth daily. 90 tablet 1  . ibuprofen (ADVIL,MOTRIN) 800 MG tablet Take 1 tablet (800 mg total) by mouth 3 (three) times daily. 21 tablet 0  . labetalol (NORMODYNE) 100 MG tablet TAKE 1 TABLET BY MOUTH DAILY. 90 tablet 2  . LORazepam (ATIVAN) 0.5 MG tablet TAKE 1 TABLET BY MOUTH DAILY AS NEEDED FOR ANXIETY (Patient taking differently: TAKE 1 TABLET BY MOUTH AT BEDTIME) 30 tablet 3  . losartan (COZAAR) 25 MG tablet TAKE 1 TABLET BY MOUTH DAILY. 30 tablet 11  . meclizine (ANTIVERT) 12.5 MG tablet TAKE 1 TABLET BY MOUTH AS NEEDED. 30 tablet 5  . Multiple Vitamin (MULTIVITAMIN) tablet Take 1 tablet by mouth daily.      Marland Kitchen omeprazole (PRILOSEC) 40 MG capsule TAKE 1 CAPSULE BY MOUTH DAILY. 90 capsule 1  . oxybutynin (DITROPAN  XL) 15 MG 24 hr tablet Take 15 mg by mouth at bedtime.    . polyethylene glycol powder (GLYCOLAX/MIRALAX) powder Take 17 g by mouth 2 (two) times daily as needed. 3350 g 3   No facility-administered medications prior to visit.     EXAM:  BP 136/62 mmHg  Temp(Src) 98.2 F (36.8 C) (Oral)  Ht 5' 0.25" (1.53 m)  Wt 193 lb 1.6 oz (87.59 kg)  BMI 37.42 kg/m2  Body mass index is 37.42 kg/(m^2).  GENERAL: vitals reviewed and listed above, alert, oriented, appears well hydrated and in no acute distress no change in jaundice  Here with brother  In [facility] .   HEENT: atraumatic, conjunctiva  clear, no obvious abnormalities y PSYCH: pleasant and cooperative,  Cognition intact .  Lab Results  Component Value Date   WBC 7.1 06/19/2015   HGB 11.1* 06/19/2015   HCT 33.6* 06/19/2015   PLT 279.0 06/19/2015   GLUCOSE 183* 06/19/2015   CHOL 121  10/10/2014   TRIG 145.0 10/10/2014   HDL 46.90 10/10/2014   LDLDIRECT 128.5 12/31/2011   LDLCALC 45 10/10/2014   ALT 182* 06/19/2015   AST 231* 06/19/2015   NA 141 06/19/2015   K 5.0 06/19/2015   CL 98 06/19/2015   CREATININE 2.13* 06/19/2015   BUN 41* 06/19/2015   CO2 29 06/19/2015   TSH 1.86 06/19/2015   INR 1.1* 06/19/2015   HGBA1C 5.9 03/13/2015    ASSESSMENT AND PLAN:  Discussed the following assessment and plan:  Pancreatic mass  Biliary obstruction - cause of most sx  plan for ercp in am dr Fuller Plan   Weakness  Decreased mobility and endurance  Chronic renal insufficiency, stage 3 (moderate)  Osteoarthritis, unspecified osteoarthritis type, unspecified site  Gait instability reviewed   Findings and plan for procedure     Will be called about procedure for tomorrow  At 7 30  Stent and   Brushings to make dx   rx fo walker   Given   Does have some hyperglycemia but doesn't seem related to her sx today  Did not  address this today  -Patient advised to return or notify health care team  if symptoms worsen ,persist or new concerns arise. In the interim  Patient Instructions  You will have a procedure  Tomorrow at 730 at [facility] long tomorrow. GI  Should  Contact you about details but may need to be there at 6 am .  procredure is called ercp with stent placement  Endoscopic Retrograde Cholangiopancreatography (ERCP) Endoscopic retrograde cholangiopancreatography (ERCP) is a procedure used to diagnosis many diseases of the pancreas, bile ducts, liver, and gallbladder. During ERCP a thin, lighted tube (endoscope) is passed through the mouth and down the back of the throat into the first part of the small intestine (duodenum). A small, plastic tube (cannula) is then passed through the endoscope and directed into the bile duct or pancreatic duct. Dye is then injected through the cannula and X-rays are taken to study the biliary and pancreatic passageways.  LET [facility]  CARE Katilynn Sinkler KNOW ABOUT:   Any allergies you have.   All medicines you are taking, including vitamins, herbs, eyedrops, creams, and over-the-counter medicines.   Previous problems you or members of your family have had with the use of anesthetics.   Any blood disorders you have.   Previous surgeries you have had.   Medical conditions you have. RISKS AND COMPLICATIONS Generally, ERCP is a safe procedure. However, as with any  procedure, complications can occur. A simple removal of gallstones has the lowest rate of complications. Higher rates of complication occur in people who have poorly functioning bile or pancreatic ducts. Possible complications include:   Pancreatitis.  Bleeding.  Accidental punctures in the bowel wall, pancreas, or gall bladder.  Gall bladder or bile duct infection. BEFORE THE PROCEDURE   Do not eat or drink anything, including water, for at least 8 hours before the procedure or as directed by your health care Horace Wishon.   Ask your health care Shamaya Kauer whether you should stop taking certain medicines prior to your procedure.   Arrange for someone to drive you home. You will not be allowed to drive for [id number] hours after the procedure. PROCEDURE   You will be given medicine through a vein (intravenously) to make you relaxed and sleepy.   You might have a breathing tube placed to give you medicine that makes you sleep (general anesthetic).   Your throat may be sprayed with medicine that numbs the area and prevents gagging (local anesthetic), or you may gargle this medicine.   You will lie on your left side.   The endoscope will be inserted through your mouth and into the duodenum. The tube will not interfere with your breathing. Gagging is prevented by the anesthesia.   While X-rays are being taken, you may be positioned on your stomach.   A small sample of tissue (biopsy) may be removed for examination. AFTER THE PROCEDURE   You will rest  in bed until you are fully conscious.   When you first wake up, your throat may feel slightly sore.   You will not be allowed to eat or drink until numbness subsides.   Once you are able to drink, urinate, and sit on the edge of the bed without feeling sick to your stomach (nauseous) or dizzy, you may be allowed to go home.   This information is not intended to replace advice given to you by your health care Abdoulaye Drum. Make sure you discuss any questions you have with your health care Karne Ozga.   Document Released: 04/16/2001 Document Revised: 05/12/2013 Document Reviewed: 03/02/2013 Elsevier Interactive Patient Education 2016 Weston K. Panosh M.D.

## 2015-06-21 NOTE — Telephone Encounter (Signed)
Per Dr. Fuller Plan patient to be scheduled for ERCP for tomorrow at 7:30 or 12:00 at [facility].  Patient is scheduled for tomorrow at 7:30 am.  I will contact the patient this afternoon after her appt with Dr. Regis Bill to give her instructions.

## 2015-06-22 ENCOUNTER — Ambulatory Visit (HOSPITAL_COMMUNITY): Payer: Medicare Other | Admitting: Anesthesiology

## 2015-06-22 ENCOUNTER — Encounter (HOSPITAL_COMMUNITY): Payer: Self-pay

## 2015-06-22 ENCOUNTER — Encounter (HOSPITAL_COMMUNITY)
Admission: RE | Disposition: A | Discharge status: Home / Self Care | Payer: Self-pay | Source: Ambulatory Visit | Attending: Gastroenterology

## 2015-06-22 ENCOUNTER — Ambulatory Visit (HOSPITAL_COMMUNITY): Payer: Medicare Other

## 2015-06-22 ENCOUNTER — Ambulatory Visit (HOSPITAL_COMMUNITY)
Admission: RE | Admit: 2015-06-22 | Discharge: 2015-06-22 | Disposition: A | Discharge status: Home / Self Care | Payer: Medicare Other | Source: Ambulatory Visit | Attending: Gastroenterology | Admitting: Gastroenterology

## 2015-06-22 DIAGNOSIS — F329 Major depressive disorder, single episode, unspecified: Secondary | ICD-10-CM | POA: Diagnosis not present

## 2015-06-22 DIAGNOSIS — R945 Abnormal results of liver function studies: Secondary | ICD-10-CM

## 2015-06-22 DIAGNOSIS — Z79899 Other long term (current) drug therapy: Secondary | ICD-10-CM | POA: Diagnosis not present

## 2015-06-22 DIAGNOSIS — R531 Weakness: Secondary | ICD-10-CM | POA: Diagnosis not present

## 2015-06-22 DIAGNOSIS — N183 Chronic kidney disease, stage 3 (moderate): Secondary | ICD-10-CM | POA: Insufficient documentation

## 2015-06-22 DIAGNOSIS — K219 Gastro-esophageal reflux disease without esophagitis: Secondary | ICD-10-CM | POA: Diagnosis not present

## 2015-06-22 DIAGNOSIS — I129 Hypertensive chronic kidney disease with stage 1 through stage 4 chronic kidney disease, or unspecified chronic kidney disease: Secondary | ICD-10-CM | POA: Diagnosis not present

## 2015-06-22 DIAGNOSIS — Z791 Long term (current) use of non-steroidal anti-inflammatories (NSAID): Secondary | ICD-10-CM | POA: Insufficient documentation

## 2015-06-22 DIAGNOSIS — K869 Disease of pancreas, unspecified: Secondary | ICD-10-CM | POA: Diagnosis not present

## 2015-06-22 DIAGNOSIS — K838 Other specified diseases of biliary tract: Secondary | ICD-10-CM | POA: Diagnosis not present

## 2015-06-22 DIAGNOSIS — K5710 Diverticulosis of small intestine without perforation or abscess without bleeding: Secondary | ICD-10-CM | POA: Insufficient documentation

## 2015-06-22 DIAGNOSIS — E785 Hyperlipidemia, unspecified: Secondary | ICD-10-CM | POA: Diagnosis not present

## 2015-06-22 DIAGNOSIS — R932 Abnormal findings on diagnostic imaging of liver and biliary tract: Secondary | ICD-10-CM | POA: Diagnosis not present

## 2015-06-22 DIAGNOSIS — Z7951 Long term (current) use of inhaled steroids: Secondary | ICD-10-CM | POA: Insufficient documentation

## 2015-06-22 DIAGNOSIS — K449 Diaphragmatic hernia without obstruction or gangrene: Secondary | ICD-10-CM | POA: Insufficient documentation

## 2015-06-22 DIAGNOSIS — R7989 Other specified abnormal findings of blood chemistry: Secondary | ICD-10-CM | POA: Diagnosis not present

## 2015-06-22 DIAGNOSIS — K831 Obstruction of bile duct: Secondary | ICD-10-CM | POA: Diagnosis not present

## 2015-06-22 DIAGNOSIS — M1990 Unspecified osteoarthritis, unspecified site: Secondary | ICD-10-CM | POA: Diagnosis not present

## 2015-06-22 DIAGNOSIS — C240 Malignant neoplasm of extrahepatic bile duct: Secondary | ICD-10-CM | POA: Diagnosis not present

## 2015-06-22 DIAGNOSIS — I739 Peripheral vascular disease, unspecified: Secondary | ICD-10-CM | POA: Diagnosis not present

## 2015-06-22 DIAGNOSIS — K8689 Other specified diseases of pancreas: Secondary | ICD-10-CM

## 2015-06-22 DIAGNOSIS — I10 Essential (primary) hypertension: Secondary | ICD-10-CM | POA: Diagnosis not present

## 2015-06-22 HISTORY — PX: ERCP: SHX5425

## 2015-06-22 SURGERY — ERCP, WITH INTERVENTION IF INDICATED
Anesthesia: General

## 2015-06-22 MED ORDER — SODIUM CHLORIDE 0.9 % IV SOLN
INTRAVENOUS | Status: DC | PRN
  Administered 2015-06-22: 10 mL

## 2015-06-22 MED ORDER — SODIUM CHLORIDE 0.9 % IV SOLN
INTRAVENOUS | Status: DC
  Administered 2015-06-22: 1000 mL via INTRAVENOUS

## 2015-06-22 MED ORDER — GLUCAGON HCL RDNA (DIAGNOSTIC) 1 MG IJ SOLR
INTRAMUSCULAR | Status: DC | PRN
  Administered 2015-06-22 (×2): 0.25 mg via INTRAVENOUS

## 2015-06-22 MED ORDER — PROPOFOL 10 MG/ML IV BOLUS
INTRAVENOUS | Status: DC | PRN
  Administered 2015-06-22: 150 mg via INTRAVENOUS

## 2015-06-22 MED ORDER — INDOMETHACIN 50 MG RE SUPP
100.0000 mg | Freq: Once | RECTAL | Status: AC
  Administered 2015-06-22: 100 mg via RECTAL

## 2015-06-22 MED ORDER — AMPICILLIN-SULBACTAM SODIUM 1.5 (1-0.5) G IJ SOLR
1.5000 g | Freq: Once | INTRAMUSCULAR | Status: AC
  Administered 2015-06-22: 1.5 g via INTRAVENOUS
  Filled 2015-06-22: qty 1.5

## 2015-06-22 MED ORDER — ONDANSETRON HCL 4 MG/2ML IJ SOLN
INTRAMUSCULAR | Status: DC | PRN
  Administered 2015-06-22: 4 mg via INTRAVENOUS

## 2015-06-22 MED ORDER — ONDANSETRON HCL 4 MG/2ML IJ SOLN
INTRAMUSCULAR | Status: AC
  Filled 2015-06-22: qty 2

## 2015-06-22 MED ORDER — GLUCAGON HCL RDNA (DIAGNOSTIC) 1 MG IJ SOLR
INTRAMUSCULAR | Status: AC
  Filled 2015-06-22: qty 1

## 2015-06-22 MED ORDER — PROPOFOL 10 MG/ML IV BOLUS
INTRAVENOUS | Status: AC
  Filled 2015-06-22: qty 20

## 2015-06-22 MED ORDER — LACTATED RINGERS IV SOLN: INTRAVENOUS | Status: DC | PRN

## 2015-06-22 MED ORDER — INDOMETHACIN 50 MG RE SUPP
RECTAL | Status: AC
  Filled 2015-06-22: qty 2

## 2015-06-22 MED ORDER — SUCCINYLCHOLINE CHLORIDE 20 MG/ML IJ SOLN
INTRAMUSCULAR | Status: DC | PRN
  Administered 2015-06-22: 100 mg via INTRAVENOUS

## 2015-06-22 SURGICAL SUPPLY — 1 items: PREP MICROBIOTA FECAL (Tissue) ×1 IMPLANT

## 2015-06-22 NOTE — Discharge Instructions (Signed)
Endoscopic Retrograde Cholangiopancreatography (ERCP), Care After Refer to this sheet in the next few weeks. These instructions provide you with information on caring for yourself after your procedure. Your health care provider may also give you more specific instructions. Your treatment has been planned according to current medical practices, but problems sometimes occur. Call your health care provider if you have any problems or questions after your procedure.  WHAT TO EXPECT AFTER THE PROCEDURE  After your procedure, it is typical to feel:   Soreness in your throat.   Sick to your stomach (nauseous).   Bloated.  Dizzy.   Fatigued. HOME CARE INSTRUCTIONS  Have a friend or family member stay with you for the first 24 hours after your procedure.  Start taking your usual medicines and eating normally as soon as you feel well enough to do so or as directed by your health care provider. SEEK MEDICAL CARE IF:  You have abdominal pain.   You develop signs of infection, such as:   Chills.   Feeling unwell.  SEEK IMMEDIATE MEDICAL CARE IF:  You have difficulty swallowing.  You have worsening throat, chest, or abdominal pain.  You vomit.  You have bloody or very black stools.  You have a fever.   This information is not intended to replace advice given to you by your health care provider. Make sure you discuss any questions you have with your health care provider.   Document Released: 05/12/2013 Document Reviewed: 05/12/2013 Elsevier Interactive Patient Education 2016 [place] Anesthesia, Adult, Care After Refer to this sheet in the next few weeks. These instructions provide you with information on caring for yourself after your procedure. Your health care provider may also give you more specific instructions. Your treatment has been planned according to current medical practices, but problems sometimes occur. Call your health care provider if you  have any problems or questions after your procedure. WHAT TO EXPECT AFTER THE PROCEDURE After the procedure, it is typical to experience:  Sleepiness.  Nausea and vomiting. HOME CARE INSTRUCTIONS  For the first 24 hours after general anesthesia:  Have a responsible person with you.  Do not drive a car. If you are alone, do not take public transportation.  Do not drink alcohol.  Do not take medicine that has not been prescribed by your health care provider.  Do not sign important papers or make important decisions.  You may resume a normal diet and activities as directed by your health care provider.  Change bandages (dressings) as directed.  If you have questions or problems that seem related to general anesthesia, call the hospital and ask for the anesthetist or anesthesiologist on call. SEEK MEDICAL CARE IF:  You have nausea and vomiting that continue the day after anesthesia.  You develop a rash. SEEK IMMEDIATE MEDICAL CARE IF:   You have difficulty breathing.  You have chest pain.  You have any allergic problems.   This information is not intended to replace advice given to you by your health care provider. Make sure you discuss any questions you have with your health care provider.   Document Released: 10/28/2000 Document Revised: 08/12/2014 Document Reviewed: 11/20/2011 Elsevier Interactive Patient Education [employer].

## 2015-06-22 NOTE — Anesthesia Procedure Notes (Signed)
Procedure Name: Intubation Date/Time: 06/22/2015 7:52 AM Performed by: Dione Booze Pre-anesthesia Checklist: Emergency Drugs available, Suction available, Patient identified and Patient being monitored Patient Re-evaluated:Patient Re-evaluated prior to inductionOxygen Delivery Method: Circle system utilized Preoxygenation: Pre-oxygenation with 100% oxygen Intubation Type: IV induction Laryngoscope Size: Mac and 4 Grade View: Grade II Tube type: Oral Tube size: 7.5 mm Number of attempts: 1 Airway Equipment and Method: Stylet Placement Confirmation: ETT inserted through vocal cords under direct vision,  breath sounds checked- equal and bilateral and positive ETCO2 Secured at: 21 cm Tube secured with: Tape Dental Injury: Teeth and Oropharynx as per pre-operative assessment

## 2015-06-22 NOTE — Op Note (Signed)
[facility] [place], [address]   ENDOSOCOPY REPORT - 06/22/2015  name:  Linda Bentley, Linda Bentley #:  QN:[id number] dob:  [birth year]     mr #:  female g.i. attending:  Ladene Artist, MD, [facility] assistant:  Stacey Drain, [place]  procedure:     1.  ERCP with CBD brushings, and biliary stent placement indications:     abnormal abdominal CT, tumor of the head of pancreas, and obstructive jaundice. medications:     Per Anesthesia topical anesthetic:     none  description of procedure: The risks benefits and alternatives of the procedure were thoroughly explained.  The patient was informed of possible complications, which included but was not limited to pancreatitis, bleeding, perforation, infection, drug reaction, possible surgery for any complications and remote chance of life threatening complications. Patient's questions were answered and informed consent obtained. The    endoscope was introduced through the mouth and advanced to the second portion of the duodenum.    findings:  There was a small periampullary diverticulum.  The ampulla appeared normal. The CBD was freely cannulated and a guidewire was advanced to the intrahepatic ducts. 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  There was dilation of the proximal CBD, intraheptic ducts, and common hepatic duct. Prior cholecystectomy and surgical clips noted. Under endoscopic and fluoroscopic guidance, brushing of the stricture were obtained and then a 59mm x 6cm uncovered metal stent was placed in the common bile duct. The stent was in very position across the stricture. Dark bile drained freely following stent placement. The PD was not cannulated or injected by intention.  The scope was then completely withdrawn from the patient and the procedure terminated. specimen(s) taken:  [ ]  Yes     [ ]  No estimated blood loss:  [ ]  None      [ ]  < 5cc      [ ]  > 5cc  complications:  There were no complications. patient condition:  stable  impression:     1.  Small periampullary diverticulum 2.  Stricture in the mid common bile duct; brushings obtained, metal biliary stent placed 3.  Proximal biliary dilation  recommendations:     1.  Await brushings 2.  Observation post ERCP   Ladene Artist, MD, Marval Regal  cc:     Burnis Medin, M.D.

## 2015-06-22 NOTE — H&P (View-Only) (Signed)
Pre visit review using our clinic review tool, if applicable. No additional management support is needed unless otherwise documented below in the visit note.   Chief Complaint  Patient presents with  . Follow-up    HPI: Here with brother  For   Results of labs and scans  Still dizzy headache at times .   No vomiting still itching   bp ok though  Feels more weak  .    Asks for rx for 3 point walker with wheel   Someone called her [facility]  About procedure she didn't yet nowabout. ROS: See pertinent positives and negatives per HPI.  Past Medical History  Diagnosis Date  . Depression   . GERD (gastroesophageal reflux disease)   . Hyperlipidemia   . Hypertension   . Osteopenia   . Renal insufficiency 2007    creat 1.3  . Hiatal hernia   . LBP (low back pain)   . UNSPECIFIED PERIPHERAL VASCULAR DISEASE 06/08/2010  . ANEMIA-UNSPECIFIED 07/03/2009  . HYPERGLYCEMIA 02/26/2007  . GERD 02/26/2007    Family History  Problem Relation Age of Onset  . Kidney disease Father     failure  . Colon cancer Sister     Social History   Social History  . Marital Status: Widowed    Spouse Name: N/A  . Number of Children: 0  . Years of Education: N/A   Occupational History  . Retired    Social History Main Topics  . Smoking status: Never Smoker   . Smokeless tobacco: None  . Alcohol Use: No  . Drug Use: No  . Sexual Activity: Not Asked   Other Topics Concern  . None   Social History Narrative   Physician roster:      Nephrologist-Dr. Lyda Kalata   Ophthalmologists-Dr. Melissa Noon   Orthopedic specialist-Dr. Dara Lords - husband passed 21 years ago.  No children    Outpatient Prescriptions Prior to Visit  Medication Sig Dispense Refill  . atorvastatin (LIPITOR) 40 MG tablet Take 1 tablet (40 mg total) by mouth daily. 90 tablet 1  . calcium carbonate (TUMS - DOSED IN MG ELEMENTAL CALCIUM) 500 MG chewable tablet Chew 1 tablet by mouth daily as needed for  heartburn.    . citalopram (CELEXA) 20 MG tablet Take 1 tablet (20 mg total) by mouth daily. 90 tablet 1  . citalopram (CELEXA) 20 MG tablet TAKE 1 TABLET BY MOUTH DAILY. 90 tablet 1  . fluticasone (FLONASE) 50 MCG/ACT nasal spray Place 2 sprays into both nostrils daily. 16 g 1  . furosemide (LASIX) 20 MG tablet Take 1 tablet (20 mg total) by mouth daily. 90 tablet 1  . ibuprofen (ADVIL,MOTRIN) 800 MG tablet Take 1 tablet (800 mg total) by mouth 3 (three) times daily. 21 tablet 0  . labetalol (NORMODYNE) 100 MG tablet TAKE 1 TABLET BY MOUTH DAILY. 90 tablet 2  . LORazepam (ATIVAN) 0.5 MG tablet TAKE 1 TABLET BY MOUTH DAILY AS NEEDED FOR ANXIETY (Patient taking differently: TAKE 1 TABLET BY MOUTH AT BEDTIME) 30 tablet 3  . losartan (COZAAR) 25 MG tablet TAKE 1 TABLET BY MOUTH DAILY. 30 tablet 11  . meclizine (ANTIVERT) 12.5 MG tablet TAKE 1 TABLET BY MOUTH AS NEEDED. 30 tablet 5  . Multiple Vitamin (MULTIVITAMIN) tablet Take 1 tablet by mouth daily.      Marland Kitchen omeprazole (PRILOSEC) 40 MG capsule TAKE 1 CAPSULE BY MOUTH DAILY. 90 capsule 1  . oxybutynin (DITROPAN  XL) 15 MG 24 hr tablet Take 15 mg by mouth at bedtime.    . polyethylene glycol powder (GLYCOLAX/MIRALAX) powder Take 17 g by mouth 2 (two) times daily as needed. 3350 g 3   No facility-administered medications prior to visit.     EXAM:  BP 136/62 mmHg  Temp(Src) 98.2 F (36.8 C) (Oral)  Ht 5' 0.25" (1.53 m)  Wt 193 lb 1.6 oz (87.59 kg)  BMI 37.42 kg/m2  Body mass index is 37.42 kg/(m^2).  GENERAL: vitals reviewed and listed above, alert, oriented, appears well hydrated and in no acute distress no change in jaundice  Here with brother  In [facility] .   HEENT: atraumatic, conjunctiva  clear, no obvious abnormalities y PSYCH: pleasant and cooperative,  Cognition intact .  Lab Results  Component Value Date   WBC 7.1 06/19/2015   HGB 11.1* 06/19/2015   HCT 33.6* 06/19/2015   PLT 279.0 06/19/2015   GLUCOSE 183* 06/19/2015   CHOL 121  10/10/2014   TRIG 145.0 10/10/2014   HDL 46.90 10/10/2014   LDLDIRECT 128.5 12/31/2011   LDLCALC 45 10/10/2014   ALT 182* 06/19/2015   AST 231* 06/19/2015   NA 141 06/19/2015   K 5.0 06/19/2015   CL 98 06/19/2015   CREATININE 2.13* 06/19/2015   BUN 41* 06/19/2015   CO2 29 06/19/2015   TSH 1.86 06/19/2015   INR 1.1* 06/19/2015   HGBA1C 5.9 03/13/2015    ASSESSMENT AND PLAN:  Discussed the following assessment and plan:  Pancreatic mass  Biliary obstruction - cause of most sx  plan for ercp in am dr Fuller Plan   Weakness  Decreased mobility and endurance  Chronic renal insufficiency, stage 3 (moderate)  Osteoarthritis, unspecified osteoarthritis type, unspecified site  Gait instability reviewed   Findings and plan for procedure     Will be called about procedure for tomorrow  At 7 30  Stent and   Brushings to make dx   rx fo walker   Given   Does have some hyperglycemia but doesn't seem related to her sx today  Did not  address this today  -Patient advised to return or notify health care team  if symptoms worsen ,persist or new concerns arise. In the interim  Patient Instructions  You will have a procedure  Tomorrow at 730 at [facility] long tomorrow. GI  Should  Contact you about details but may need to be there at 6 am .  procredure is called ercp with stent placement  Endoscopic Retrograde Cholangiopancreatography (ERCP) Endoscopic retrograde cholangiopancreatography (ERCP) is a procedure used to diagnosis many diseases of the pancreas, bile ducts, liver, and gallbladder. During ERCP a thin, lighted tube (endoscope) is passed through the mouth and down the back of the throat into the first part of the small intestine (duodenum). A small, plastic tube (cannula) is then passed through the endoscope and directed into the bile duct or pancreatic duct. Dye is then injected through the cannula and X-rays are taken to study the biliary and pancreatic passageways.  LET [facility]  CARE PROVIDER KNOW ABOUT:   Any allergies you have.   All medicines you are taking, including vitamins, herbs, eyedrops, creams, and over-the-counter medicines.   Previous problems you or members of your family have had with the use of anesthetics.   Any blood disorders you have.   Previous surgeries you have had.   Medical conditions you have. RISKS AND COMPLICATIONS Generally, ERCP is a safe procedure. However, as with any  procedure, complications can occur. A simple removal of gallstones has the lowest rate of complications. Higher rates of complication occur in people who have poorly functioning bile or pancreatic ducts. Possible complications include:   Pancreatitis.  Bleeding.  Accidental punctures in the bowel wall, pancreas, or gall bladder.  Gall bladder or bile duct infection. BEFORE THE PROCEDURE   Do not eat or drink anything, including water, for at least 8 hours before the procedure or as directed by your health care provider.   Ask your health care provider whether you should stop taking certain medicines prior to your procedure.   Arrange for someone to drive you home. You will not be allowed to drive for [id number] hours after the procedure. PROCEDURE   You will be given medicine through a vein (intravenously) to make you relaxed and sleepy.   You might have a breathing tube placed to give you medicine that makes you sleep (general anesthetic).   Your throat may be sprayed with medicine that numbs the area and prevents gagging (local anesthetic), or you may gargle this medicine.   You will lie on your left side.   The endoscope will be inserted through your mouth and into the duodenum. The tube will not interfere with your breathing. Gagging is prevented by the anesthesia.   While X-rays are being taken, you may be positioned on your stomach.   A small sample of tissue (biopsy) may be removed for examination. AFTER THE PROCEDURE   You will rest  in bed until you are fully conscious.   When you first wake up, your throat may feel slightly sore.   You will not be allowed to eat or drink until numbness subsides.   Once you are able to drink, urinate, and sit on the edge of the bed without feeling sick to your stomach (nauseous) or dizzy, you may be allowed to go home.   This information is not intended to replace advice given to you by your health care provider. Make sure you discuss any questions you have with your health care provider.   Document Released: 04/16/2001 Document Revised: 05/12/2013 Document Reviewed: 03/02/2013 Elsevier Interactive Patient Education 2016 Lea K. Nathanyal Ashmead M.D.

## 2015-06-22 NOTE — Anesthesia Preprocedure Evaluation (Addendum)
Anesthesia Evaluation  Patient identified by MRN, date of birth, ID band Patient awake    Reviewed: Allergy & Precautions, NPO status , Patient's Chart, lab work & pertinent test results  Airway Mallampati: II  TM Distance: >3 FB Neck ROM: Full    Dental no notable dental hx. (+) Partial Upper   Pulmonary neg pulmonary ROS,    Pulmonary exam normal breath sounds clear to auscultation       Cardiovascular hypertension, negative cardio ROS Normal cardiovascular exam Rhythm:Regular Rate:Normal     Neuro/Psych negative neurological ROS  negative psych ROS   GI/Hepatic Neg liver ROS, hiatal hernia,   Endo/Other  negative endocrine ROS  Renal/GU Renal InsufficiencyRenal disease  negative genitourinary   Musculoskeletal negative musculoskeletal ROS (+)   Abdominal   Peds negative pediatric ROS (+)  Hematology negative hematology ROS (+)   Anesthesia Other Findings   Reproductive/Obstetrics negative OB ROS                           Anesthesia Physical Anesthesia Plan  ASA: III  Anesthesia Plan: General   Post-op Pain Management:    Induction: Intravenous  Airway Management Planned: Oral ETT  Additional Equipment:   Intra-op Plan:   Post-operative Plan: Extubation in OR  Informed Consent: I have reviewed the patients History and Physical, chart, labs and discussed the procedure including the risks, benefits and alternatives for the proposed anesthesia with the patient or authorized representative who has indicated his/her understanding and acceptance.   Dental advisory given  Plan Discussed with: CRNA  Anesthesia Plan Comments:         Anesthesia Quick Evaluation

## 2015-06-22 NOTE — Anesthesia Postprocedure Evaluation (Signed)
  Anesthesia Post-op Note  Patient: Linda Bentley  Procedure(s) Performed: Procedure(s) (LRB): ENDOSCOPIC RETROGRADE CHOLANGIOPANCREATOGRAPHY (ERCP) (N/A)  Patient Location: PACU  Anesthesia Type: General  Level of Consciousness: awake and alert   Airway and Oxygen Therapy: Patient Spontanous Breathing  Post-op Pain: mild  Post-op Assessment: Post-op Vital signs reviewed, Patient's Cardiovascular Status Stable, Respiratory Function Stable, Patent Airway and No signs of Nausea or vomiting  Last Vitals:  Filed Vitals:   06/22/15 0930  BP:   Pulse: 61  Temp:   Resp: 18    Post-op Vital Signs: stable   Complications: No apparent anesthesia complications

## 2015-06-22 NOTE — Transfer of Care (Signed)
Immediate Anesthesia Transfer of Care Note  Patient: Linda Bentley  Procedure(s) Performed: Procedure(s): ENDOSCOPIC RETROGRADE CHOLANGIOPANCREATOGRAPHY (ERCP) (N/A)  Patient Location: PACU and Endoscopy Unit  Anesthesia Type:General  Level of Consciousness: awake, alert , oriented and patient cooperative  Airway & Oxygen Therapy: Patient Spontanous Breathing and Patient connected to face mask oxygen  Post-op Assessment: Report given to RN and Post -op Vital signs reviewed and stable  Post vital signs: Reviewed and stable  Last Vitals:  Filed Vitals:   06/22/15 0634  BP: 148/37  Pulse: 64  Temp: 36.8 C  Resp: 20    Complications: No apparent anesthesia complications

## 2015-06-22 NOTE — Interval H&P Note (Signed)
History and Physical Interval Note:  06/22/2015 7:41 AM  Linda Bentley  has presented today for surgery, with the diagnosis of dilated pancreatic duct  The various methods of treatment have been discussed with the patient and family. After consideration of risks, benefits and other options for treatment, the patient has consented to  Procedure(s): ENDOSCOPIC RETROGRADE CHOLANGIOPANCREATOGRAPHY (ERCP) (N/A) as a surgical intervention .  The patient's history has been reviewed, patient examined, no change in status, stable for surgery.  I have reviewed the patient's chart and labs.  Questions were answered to the patient's satisfaction.     Pricilla Riffle. Fuller Plan

## 2015-06-26 ENCOUNTER — Telehealth: Payer: Self-pay | Admitting: *Deleted

## 2015-06-26 ENCOUNTER — Other Ambulatory Visit: Payer: Self-pay

## 2015-06-26 ENCOUNTER — Encounter (HOSPITAL_COMMUNITY): Payer: Self-pay | Admitting: Gastroenterology

## 2015-06-26 DIAGNOSIS — C249 Malignant neoplasm of biliary tract, unspecified: Secondary | ICD-10-CM

## 2015-06-26 NOTE — Telephone Encounter (Signed)
Oncology Nurse Navigator Documentation  Oncology Nurse Navigator Flowsheets 06/26/2015  Referral date to RadOnc/MedOnc 06/26/2015  Navigator Encounter Type Introductory phone call  Received referral from [place]. Offered patient appointment on 06/27/15-unable to come due to transportation and family needing to be present. She agrees to see Dr. Irene Limbo on 11/29 at 2 pm. Made her aware of valet parking-she is already aware since her brother is a patient at [facility].  She reports her itching and pain have improved with the stent. Still feels weak. She is widowed and lives alone.

## 2015-07-04 ENCOUNTER — Ambulatory Visit (HOSPITAL_BASED_OUTPATIENT_CLINIC_OR_DEPARTMENT_OTHER): Payer: Medicare Other

## 2015-07-04 ENCOUNTER — Encounter: Payer: Self-pay | Admitting: Hematology

## 2015-07-04 ENCOUNTER — Ambulatory Visit (HOSPITAL_BASED_OUTPATIENT_CLINIC_OR_DEPARTMENT_OTHER): Payer: Medicare Other | Admitting: Hematology

## 2015-07-04 ENCOUNTER — Telehealth: Payer: Self-pay | Admitting: Hematology

## 2015-07-04 ENCOUNTER — Encounter: Payer: Self-pay | Admitting: *Deleted

## 2015-07-04 VITALS — BP 122/50 | HR 77 | Temp 98.3°F | Resp 18 | Ht 60.25 in | Wt 192.2 lb

## 2015-07-04 DIAGNOSIS — C251 Malignant neoplasm of body of pancreas: Secondary | ICD-10-CM | POA: Diagnosis not present

## 2015-07-04 DIAGNOSIS — R7989 Other specified abnormal findings of blood chemistry: Secondary | ICD-10-CM

## 2015-07-04 DIAGNOSIS — E43 Unspecified severe protein-calorie malnutrition: Secondary | ICD-10-CM

## 2015-07-04 LAB — CBC & DIFF AND RETIC
BASO%: 0.3 % (ref 0.0–2.0)
BASOS ABS: 0 10*3/uL (ref 0.0–0.1)
EOS%: 5.1 % (ref 0.0–7.0)
Eosinophils Absolute: 0.3 10*3/uL (ref 0.0–0.5)
HEMATOCRIT: 32.9 % — AB (ref 34.8–46.6)
HEMOGLOBIN: 10.9 g/dL — AB (ref 11.6–15.9)
IMMATURE RETIC FRACT: 4.1 % (ref 1.60–10.00)
LYMPH%: 33.7 % (ref 14.0–49.7)
MCH: 30.6 pg (ref 25.1–34.0)
MCHC: 33.1 g/dL (ref 31.5–36.0)
MCV: 92.4 fL (ref 79.5–101.0)
MONO#: 0.5 10*3/uL (ref 0.1–0.9)
MONO%: 7.7 % (ref 0.0–14.0)
NEUT%: 53.2 % (ref 38.4–76.8)
NEUTROS ABS: 3.3 10*3/uL (ref 1.5–6.5)
Platelets: 176 10*3/uL (ref 145–400)
RBC: 3.56 10*6/uL — ABNORMAL LOW (ref 3.70–5.45)
RDW: 12.2 % (ref 11.2–14.5)
RETIC %: 1.62 % (ref 0.70–2.10)
RETIC CT ABS: 57.67 10*3/uL (ref 33.70–90.70)
WBC: 6.3 10*3/uL (ref 3.9–10.3)
lymph#: 2.1 10*3/uL (ref 0.9–3.3)

## 2015-07-04 LAB — COMPREHENSIVE METABOLIC PANEL (CC13)
ALBUMIN: 3.7 g/dL (ref 3.5–5.0)
ALK PHOS: 399 U/L — AB (ref 40–150)
ALT: 25 U/L (ref 0–55)
AST: 20 U/L (ref 5–34)
Anion Gap: 11 mEq/L (ref 3–11)
BUN: 42 mg/dL — AB (ref 7.0–26.0)
CALCIUM: 9.9 mg/dL (ref 8.4–10.4)
CO2: 27 mEq/L (ref 22–29)
CREATININE: 1.8 mg/dL — AB (ref 0.6–1.1)
Chloride: 101 mEq/L (ref 98–109)
EGFR: 25 mL/min/{1.73_m2} — ABNORMAL LOW (ref >90)
GLUCOSE: 176 mg/dL — AB (ref 70–140)
POTASSIUM: 5 meq/L (ref 3.5–5.1)
SODIUM: 139 meq/L (ref 136–145)
Total Bilirubin: 1.36 mg/dL — ABNORMAL HIGH (ref 0.20–1.20)
Total Protein: 7.4 g/dL (ref 6.4–8.3)

## 2015-07-04 MED ORDER — DEXAMETHASONE 2 MG PO TABS: 2.0000 mg | ORAL_TABLET | Freq: Every day | ORAL | Status: DC

## 2015-07-04 MED ORDER — SENNOSIDES-DOCUSATE SODIUM 8.6-50 MG PO TABS: 2.0000 | ORAL_TABLET | Freq: Every day | ORAL | Status: DC

## 2015-07-04 MED ORDER — PANCRELIPASE (LIP-PROT-AMYL) 12000-38000 UNITS PO CPEP: 12000.0000 [IU] | ORAL_CAPSULE | Freq: Three times a day (TID) | ORAL | Status: AC

## 2015-07-04 NOTE — Progress Notes (Signed)
Oncology Nurse Navigator Documentation  Oncology Nurse Navigator Flowsheets 07/04/2015  Referral date to RadOnc/MedOnc -  Navigator Encounter Type Initial MedOnc  Patient Visit Type Medonc  Treatment Phase Other-Staging/work up of abnormal scan/ERCP  Barriers/Navigation Needs Family concerns;Education  Education Understanding Cancer/ Treatment Options;Symptom Management;Newly Diagnosed Cancer Education  Interventions Referrals;Education Method  Referrals Nutrition/dietician  Education Method Verbal;Written;Teach-back  Support Groups/Services GI  Time Spent with Patient 1  Met with patient, brother Linda Bentley, and niece Linda Bentley during new patient visit. Explained the role of the GI Nurse Navigator and provided New Patient Packet with information on: 1. Pancreas cancer 2. Support groups 3. Advanced Directives 4. Fall Safety Plan Answered questions, reviewed current treatment plan using TEACH back and provided emotional support. Provided copy of current treatment plan. She has no transportation needs at this time-family very supportive. Lives alone and independent in [place] -walker for ambulation when out of home. Currently has no home needs, but may in future with disease progression. Linda Bentley has a strong faith and is glad to have lived to be 63 this weekend coming up. Lives in small senior apartment with small step to enter, but has handrail. Reviewed her new medications ordered by Dr. Irene Limbo and instructed her not to pick up the Creon if the co pay is too high. If she can't afford it, can look into patient assistance. Stressed importance of keeping her bowels moving.   Linda Elks, RN, BSN GI Oncology [place]

## 2015-07-04 NOTE — Telephone Encounter (Signed)
per pof to sch pt appt-gave pt copy of avs-sent back to lab-adv [facility] will call to sch appt

## 2015-07-04 NOTE — Progress Notes (Signed)
Marland Kitchen    HEMATOLOGY/ONCOLOGY CONSULTATION NOTE  Date of Service: 07/04/2015  Patient Care Team: Doe-Hyun Kyra Searles, DO as PCP - General (Internal Medicine) Corliss Parish, MD as Consulting Physician (Nephrology)  CHIEF COMPLAINTS/PURPOSE OF CONSULTATION:  Newly diagnosed pancreatico-biliary adenocarcinoma  HISTORY OF PRESENTING ILLNESS:  Linda Bentley is a wonderful 81 y.o. female who has been referred to [place] by Dr .Drema Pry, DO for evaluation and management of newly diagnosed pancreaticobiliary adenocarcinoma.  Patient has a history of hypertension, dyslipidemia, chronic renal insufficiency who presented to her primary care physician with nearly 30 pound weight loss over 3-4 weeks with increasing fatigue, loss of appetite and lethargy. She had initial labs to workup the symptoms which showed a direct hyperbilirubinemia with a total bilirubin of 4.2 with a direct of 2.7 and an elevated alkaline phosphatase of 1152, AST 231, ALT 182. Sedimentation rate was elevated at 114. TSH within normal limits. This led to a CT of the abdomen on 06/20/2015 without IV contrast which showed an approximately 3 cm mass involving the pancreatic head accounting for marked dilatation of the CBD and common hepatic duct. No acute abnormalities otherwise noted.   Patient was subsequently referred to Dr. Fuller Plan from gastroenterology and had an ERCP on 06/22/2015.a malignant-appearing stricture was noted in the mid CBD and an uncovered metal stent was placed in the CBD. Brush cytology was obtained during the ERCP that is consistent with adenocarcinoma.  She was Medical oncology for further evaluation and management. Patient notes no acute abdominal pain but  has feelings of bloating and some constipation. She notes her appetite. She is accompanied by multiple family members to help her make treatment decisions. She does live on her own and has been independent thus far. We discussed the pathology results on the  findings on the CT scan. We discussed completing her staging with a PET/CT scan. She notes that she is uncertain whether she would want to have major surgery if that was recommended. She is willing to get a sense of where everything stands. Her family is very supportive. She notes no acute overt evidence of GI bleeding. Stools which were light-colored before his ERCP have normalized. Her bilirubin level is improving along with alkaline phosphatase.  Patient has a history of pancreatitis in 2012. Has been a lifelong nonsmoker. No alcohol use. No drugs.  MEDICAL HISTORY:  Past Medical History  Diagnosis Date  . Depression   . GERD (gastroesophageal reflux disease)   . Hyperlipidemia   . Hypertension   . Osteopenia   . Renal insufficiency 2007    creat 1.3  . Hiatal hernia   . LBP (low back pain)   . UNSPECIFIED PERIPHERAL VASCULAR DISEASE 06/08/2010  . ANEMIA-UNSPECIFIED 07/03/2009  . HYPERGLYCEMIA 02/26/2007  . GERD 02/26/2007  History of pancreatitis in 2012 History of colon polyps 8-9 years ago Mammogram April 2016 was negative  SURGICAL HISTORY: Past Surgical History  Procedure Laterality Date  . Appendectomy  1962  . Cholecystectomy    . Oophorectomy  1962  . Parathyroidectomy    . Dilation and curettage of uterus  1962  . Wrist fracture surgery  10/01  . Tail bone surgery    . Esophagogastroduodenoscopy      with dilatation Dr Fuller Plan  . Tonsillectomy and adenoidectomy    . Ingrown toenails       x3  . Ercp N/A 06/22/2015    Procedure: ENDOSCOPIC RETROGRADE CHOLANGIOPANCREATOGRAPHY (ERCP);  Surgeon: Ladene Artist, MD;  Location: [facility];  Service: Endoscopy;  Laterality: N/A;    SOCIAL HISTORY: Social History   Social History  . Marital Status: Widowed    Spouse Name: N/A  . Number of Children: 0  . Years of Education: N/A   Occupational History  . Retired    Social History Main Topics  . Smoking status: Never Smoker   . Smokeless tobacco: Not on file    . Alcohol Use: No  . Drug Use: No  . Sexual Activity: Not on file   Other Topics Concern  . Not on file   Social History Narrative   Physician roster:      Nephrologist-Dr. Lyda Kalata   Ophthalmologists-Dr. [place] specialist-Dr. Dara Lords - husband passed 21 years ago.  No children   Lives in small senior apartment. Small step w/handrail to enter home   Ambulates in home and performs all ADL's-walker as needed   No longer drives: brother, Abe People usually drives her   Enjoys reading Bible and watching christian television    FAMILY HISTORY: Family History  Problem Relation Age of Onset  . Kidney disease Father     failure  . Colon cancer Sister     ALLERGIES:  is allergic to diltiazem hcl.  MEDICATIONS:  Current Outpatient Prescriptions  Medication Sig Dispense Refill  . atorvastatin (LIPITOR) 40 MG tablet Take 1 tablet (40 mg total) by mouth daily. 90 tablet 1  . calcium carbonate (TUMS - DOSED IN MG ELEMENTAL CALCIUM) 500 MG chewable tablet Chew 1 tablet by mouth daily as needed for heartburn.    . citalopram (CELEXA) 20 MG tablet Take 1 tablet (20 mg total) by mouth daily. 90 tablet 1  . citalopram (CELEXA) 20 MG tablet TAKE 1 TABLET BY MOUTH DAILY. 90 tablet 1  . fluticasone (FLONASE) 50 MCG/ACT nasal spray Place 2 sprays into both nostrils daily. 16 g 1  . furosemide (LASIX) 20 MG tablet Take 1 tablet (20 mg total) by mouth daily. 90 tablet 1  . ibuprofen (ADVIL,MOTRIN) 800 MG tablet Take 1 tablet (800 mg total) by mouth 3 (three) times daily. 21 tablet 0  . labetalol (NORMODYNE) 100 MG tablet TAKE 1 TABLET BY MOUTH DAILY. 90 tablet 2  . LORazepam (ATIVAN) 0.5 MG tablet TAKE 1 TABLET BY MOUTH DAILY AS NEEDED FOR ANXIETY (Patient taking differently: TAKE 1 TABLET BY MOUTH AT BEDTIME) 30 tablet 3  . losartan (COZAAR) 25 MG tablet TAKE 1 TABLET BY MOUTH DAILY. 30 tablet 11  . meclizine (ANTIVERT) 12.5 MG tablet TAKE 1 TABLET BY MOUTH AS  NEEDED. 30 tablet 5  . Multiple Vitamin (MULTIVITAMIN) tablet Take 1 tablet by mouth daily.      Marland Kitchen omeprazole (PRILOSEC) 40 MG capsule TAKE 1 CAPSULE BY MOUTH DAILY. 90 capsule 1  . oxybutynin (DITROPAN XL) 15 MG 24 hr tablet Take 15 mg by mouth at bedtime.    . polyethylene glycol powder (GLYCOLAX/MIRALAX) powder Take 17 g by mouth 2 (two) times daily as needed. 3350 g 3   No current facility-administered medications for this visit.    REVIEW OF SYSTEMS:    10 Point review of Systems was done is negative except as noted above.  PHYSICAL EXAMINATION: ECOG PERFORMANCE STATUS: 2-3  . Filed Vitals:   07/04/15 1359  BP: 122/50  Pulse: 77  Temp: 98.3 F (36.8 C)  Resp: 18   Filed Weights   07/04/15 1359  Weight: 192 lb 3.2 oz (87.181 kg)   .  Body mass index is 37.24 kg/(m^2).  GENERAL:elderly somewhat fatigued appearing lady, alert, in no acute distress and comfortable SKIN: skin color, texture, turgor are normal, no rashes or significant lesions EYES: normal, conjunctiva are pink and non-injected, sclera clear OROPHARYNX:no exudate, no erythema and lips, buccal mucosa, and tongue normal  NECK: supple, no JVD, thyroid normal size, non-tender, without nodularity LYMPH:  no palpable lymphadenopathy in the cervical, axillary or inguinal LUNGS: clear to auscultation with normal respiratory effort HEART: regular rate & rhythm,  no murmurs and no lower extremity edema ABDOMEN: abdomen soft, non-tender, normoactive bowel sounds  Musculoskeletal: no cyanosis of digits and no clubbing  PSYCH: alert & oriented x 3 with fluent speech NEURO: no focal motor/sensory deficits  LABORATORY DATA:  I have reviewed the data as listed  . CBC Latest Ref Rng 07/04/2015 06/19/2015  WBC 3.9 - 10.3 10e3/uL 6.3 7.1  Hemoglobin 11.6 - 15.9 g/dL 10.9(L) 11.1(L)  Hematocrit 34.8 - 46.6 % 32.9(L) 33.6(L)  Platelets 145 - 400 10e3/uL 176 279.0    Deliberately limitedCMP Latest Ref Rng 07/04/2015  06/19/2015  Glucose 70 - 140 mg/dl 176(H) 183(H)  BUN 7.0 - 26.0 mg/dL 42.0(H) 41(H)  Creatinine 0.6 - 1.1 mg/dL 1.8(H) 2.13(H)  Sodium 136 - 145 mEq/L 139 141  Potassium 3.5 - 5.1 mEq/L 5.0 5.0  Chloride 96 - 112 mEq/L - 98  CO2 22 - 29 mEq/L 27 29  Calcium 8.4 - 10.4 mg/dL 9.9 9.8  Total Protein 6.4 - 8.3 g/dL 7.4 7.0  Total Bilirubin 0.20 - 1.20 mg/dL 1.36(H) 4.2(H)  Alkaline Phos 40 - 150 U/L 399(H) 1152(H)  AST 5 - 34 U/L 20 231(H)  ALT 0 - 55 U/L 25 182(H)     RADIOGRAPHIC STUDIES: I have personally reviewed the radiological images as listed and agreed with the findings in the report. Ct Abdomen Wo Contrast  06/20/2015  CLINICAL DATA:  81 year old with 2 week history of lethargy, anorexia and jaundice. Elevated liver function tests on laboratory analysis. Progressively worsening chronic renal insufficiency (creatinine 2.13, estimated GFR 24) precluded IV contrast. EXAM: CT ABDOMEN WITHOUT CONTRAST TECHNIQUE: Multidetector CT imaging of the abdomen was performed following the standard protocol without IV contrast. Oral contrast was administered. COMPARISON:  CT abdomen and pelvis 04/09/2011. FINDINGS: Lower chest: Heart moderately enlarged. Mitral annular calcification. Visualized lung bases clear. Hepatobiliary: Gallbladder surgically absent. Interval development of marked dilation of the common bile duct and common hepatic duct, though the duct is of normal caliber distally. Mild to moderate central intrahepatic ductal dilation. No focal hepatic parenchymal abnormality, allowing for the unenhanced technique. Pancreas: Atrophic body and tail. Likely mass involving the head of the pancreas, accounting for the biliary ductal dilation, measuring approximately 2.2 x 2.8 x 3.1 cm, not present on the prior examination. Spleen:  Normal unenhanced appearance. Adrenal/Urinary Tract: Normal adrenal glands. Exophytic simple cyst arising from the posterior upper pole right kidney measuring  approximately 2.2 cm, increased in size since 2012. No significant abnormality involving either kidney allowing for the unenhanced technique. No opaque upper urinary tract calculi. Gastrointestinal: Stomach normal in appearance for degree of distention. Mild wall thickening involving the visualized distal esophagus and oral contrast material within the visualized distal esophagus. Visualized small bowel normal in appearance. Large stool burden throughout the visualized colon without morphologic abnormality. No ascites. Lymphatics/Vascular: No pathologic lymphadenopathy. Aortoiliac atherosclerosis without aneurysm. Other: Umbilical hernia containing fat, the defect measuring approximately 3 cm. Musculoskeletal: Osseous demineralization. Degenerative disc disease, spondylosis and DISH involving the  lower thoracic spine. Facet degenerative changes involving the visualized lumbar spine. IMPRESSION: 1. Approximate 3 cm mass suspected involving the pancreatic head, accounting for marked dilation of the common bile duct and common hepatic duct. MRI abdomen/MRCP may be helpful in further evaluation to confirm or deny this finding. 2. No acute abnormalities otherwise, allowing for the unenhanced technique. 3. Large stool burden involving the visualized colon. 4. Oral contrast material in the visualized distal esophagus with with associated wall thickening, query GE reflux disease. Electronically Signed   By: Evangeline Dakin M.D.   On: 06/20/2015 14:54   Dg Ercp  06/22/2015  CLINICAL DATA:  Pancreatic mass EXAM: ERCP TECHNIQUE: Multiple spot images obtained with the fluoroscopic device and submitted for interpretation post-procedure. FLUOROSCOPY TIME:  Radiation Exposure Index (as provided by the fluoroscopic device): If the device does not provide the exposure index: Fluoroscopy Time:  2 minutes and 4 seconds Number of Acquired Images:  8 COMPARISON:  None. FINDINGS: Images demonstrate metal stent placement across the  pancreatic mass within the common bile duct. IMPRESSION: See above. These images were submitted for radiologic interpretation only. Please see the procedural report for the amount of contrast and the fluoroscopy time utilized. Electronically Signed   By: Marybelle Killings M.D.   On: 06/22/2015 08:58    ASSESSMENT & PLAN:   81 year old elderly lady with multiple medical comorbidities including hypertension, dyslipidemia, chronic kidney disease with  #1 Pancreatico-Biliary Adenocarcinoma. Based on radiology this appears to be primarily a pancreatic head mass however the pathology was obtained from bili duct brushings likely from invasive pancreatic cancer. Patient has had rapid weight loss with fatigue and anorexia. Has lost about 30 pounds in the last 6 weeks or so. No abdominal pain. Some concern for exocrine pancreatic insufficiency.  #2 abnormal liver function tests due to CBD obstruction from pancreatic head mass and noted biliary duct stricture. Status post stenting with improvement in liver function tests. No fevers or chills or abdominal pain to palpation to suggest cholangitis.  Plan -We'll get a PET CT scan to complete the staging of the patient's newly diagnosed pancreatic cancer. -CA-19-9 levels. -If no evidence of metastases and the patient has potentially resectable disease we will refer the patient to hepatobiliary surgery further opinion though given the patient's overall health she might not be the best surgical candidate. She herself is disinclined to consider surgery but is willing to discuss things with the surgeon if appropriate. -Given prescription for Creon to treat exocrine pancreatic insufficiency. -Senna S to maintain bowel movements. -Avoid hepatotoxins -Further management based on staging workup with PET/CT scan. -ongoing goals of care discussion.  #3 severe protein calorie malnutrition -Given low-dose dexamethasone as an appetite stimulant. -encourage patient to  improve oral intake  #4. Patient Active Problem List   Diagnosis Date Noted  . Pancreatic cancer ([place]) 07/18/2015  . Dehydration 07/18/2015  . Common biliary duct obstruction 06/22/2015  . Abnormal CT scan, pancreas or bile duct 06/22/2015  . Obstructive jaundice 06/22/2015  . Elevated LFTs 06/22/2015  . Dysphagia 10/10/2014  . Headache 10/10/2014  . Counseling regarding end of life decision making 10/10/2014  . Preventative health care 07/11/2014  . Dizziness and giddiness 07/11/2014  . Anemia 01/25/2014  . Urinary incontinence 04/08/2012  . Back pain 04/01/2012  . Restless legs syndrome (RLS) 07/08/2011  . UNSPECIFIED PERIPHERAL VASCULAR DISEASE 06/08/2010  . OSTEOARTHRITIS, KNEE, RIGHT 12/28/2008  . OBESITY 04/14/2008  . Hyperlipidemia 02/26/2007  . DEPRESSION 02/26/2007  . Essential hypertension 02/26/2007  .  GERD 02/26/2007  . Chronic renal insufficiency 02/26/2007  . OSTEOPENIA 02/26/2007  . Prediabetes 02/26/2007   Continue follow-up with primary care physician for management of other chronic medical issues.  Return to care with Dr. Irene Limbo in 2 weeks with repeat CBC, CMP and PET CT scan  All of the patients And her family's questions were answered in details to their apparent satisfaction. The patient knows to call the clinic with any problems, questions or concerns.  I spent 55 minutes counseling the patient face to face. The total time spent in the appointment was 65 minutes and more than 50% was on counseling and direct patient cares.    Sullivan Lone MD [place] AAHIVMS [facility] Hematology/Oncology Physician [facility]  (Office):       [phone] (Work cell):  [phone] (Fax):           [phone]  07/04/2015 2:17 PM

## 2015-07-05 LAB — CANCER ANTIGEN 19-9: CA 19-9: 84.5 U/mL — ABNORMAL HIGH (ref <35.0)

## 2015-07-13 ENCOUNTER — Ambulatory Visit (HOSPITAL_COMMUNITY)
Admission: RE | Admit: 2015-07-13 | Discharge: 2015-07-13 | Disposition: A | Discharge status: Home / Self Care | Payer: Medicare Other | Source: Ambulatory Visit | Attending: Hematology | Admitting: Hematology

## 2015-07-13 DIAGNOSIS — I2510 Atherosclerotic heart disease of native coronary artery without angina pectoris: Secondary | ICD-10-CM | POA: Diagnosis not present

## 2015-07-13 DIAGNOSIS — K429 Umbilical hernia without obstruction or gangrene: Secondary | ICD-10-CM | POA: Diagnosis not present

## 2015-07-13 DIAGNOSIS — I700 Atherosclerosis of aorta: Secondary | ICD-10-CM | POA: Insufficient documentation

## 2015-07-13 DIAGNOSIS — C259 Malignant neoplasm of pancreas, unspecified: Secondary | ICD-10-CM | POA: Diagnosis not present

## 2015-07-13 DIAGNOSIS — I517 Cardiomegaly: Secondary | ICD-10-CM | POA: Insufficient documentation

## 2015-07-13 DIAGNOSIS — N289 Disorder of kidney and ureter, unspecified: Secondary | ICD-10-CM | POA: Insufficient documentation

## 2015-07-13 DIAGNOSIS — K8689 Other specified diseases of pancreas: Secondary | ICD-10-CM | POA: Diagnosis not present

## 2015-07-13 DIAGNOSIS — C251 Malignant neoplasm of body of pancreas: Secondary | ICD-10-CM

## 2015-07-13 LAB — GLUCOSE, CAPILLARY
Glucose-Capillary: 227 mg/dL — ABNORMAL HIGH (ref 65–99)
Glucose-Capillary: 231 mg/dL — ABNORMAL HIGH (ref 65–99)

## 2015-07-13 MED ORDER — FLUDEOXYGLUCOSE F - 18 (FDG) INJECTION
10.5000 | Freq: Once | INTRAVENOUS | Status: AC | PRN
  Administered 2015-07-13: 10.5 via INTRAVENOUS

## 2015-07-18 ENCOUNTER — Ambulatory Visit (HOSPITAL_BASED_OUTPATIENT_CLINIC_OR_DEPARTMENT_OTHER): Payer: Medicare Other | Admitting: Hematology

## 2015-07-18 ENCOUNTER — Ambulatory Visit: Payer: Medicare Other | Admitting: Nutrition

## 2015-07-18 ENCOUNTER — Encounter: Payer: Self-pay | Admitting: Hematology

## 2015-07-18 ENCOUNTER — Telehealth: Payer: Self-pay | Admitting: Hematology

## 2015-07-18 ENCOUNTER — Ambulatory Visit (HOSPITAL_BASED_OUTPATIENT_CLINIC_OR_DEPARTMENT_OTHER): Payer: Medicare Other

## 2015-07-18 VITALS — BP 142/47 | HR 71 | Temp 97.9°F | Resp 20 | Ht 60.25 in | Wt 187.6 lb

## 2015-07-18 DIAGNOSIS — E860 Dehydration: Secondary | ICD-10-CM | POA: Diagnosis not present

## 2015-07-18 DIAGNOSIS — R5381 Other malaise: Secondary | ICD-10-CM

## 2015-07-18 DIAGNOSIS — C250 Malignant neoplasm of head of pancreas: Secondary | ICD-10-CM

## 2015-07-18 DIAGNOSIS — C251 Malignant neoplasm of body of pancreas: Secondary | ICD-10-CM

## 2015-07-18 DIAGNOSIS — C259 Malignant neoplasm of pancreas, unspecified: Secondary | ICD-10-CM | POA: Insufficient documentation

## 2015-07-18 LAB — CBC & DIFF AND RETIC
BASO%: 0.2 % (ref 0.0–2.0)
Basophils Absolute: 0 10*3/uL (ref 0.0–0.1)
EOS ABS: 0.5 10*3/uL (ref 0.0–0.5)
EOS%: 5.2 % (ref 0.0–7.0)
HCT: 34.3 % — ABNORMAL LOW (ref 34.8–46.6)
HEMOGLOBIN: 11.3 g/dL — AB (ref 11.6–15.9)
IMMATURE RETIC FRACT: 1.8 % (ref 1.60–10.00)
LYMPH#: 1.5 10*3/uL (ref 0.9–3.3)
LYMPH%: 16.3 % (ref 14.0–49.7)
MCH: 30.3 pg (ref 25.1–34.0)
MCHC: 32.9 g/dL (ref 31.5–36.0)
MCV: 92 fL (ref 79.5–101.0)
MONO#: 0.5 10*3/uL (ref 0.1–0.9)
MONO%: 5.5 % (ref 0.0–14.0)
NEUT%: 72.8 % (ref 38.4–76.8)
NEUTROS ABS: 6.6 10*3/uL — AB (ref 1.5–6.5)
Platelets: 144 10*3/uL — ABNORMAL LOW (ref 145–400)
RBC: 3.73 10*6/uL (ref 3.70–5.45)
RDW: 12.6 % (ref 11.2–14.5)
RETIC CT ABS: 102.95 10*3/uL — AB (ref 33.70–90.70)
Retic %: 2.76 % — ABNORMAL HIGH (ref 0.70–2.10)
WBC: 9.1 10*3/uL (ref 3.9–10.3)

## 2015-07-18 LAB — COMPREHENSIVE METABOLIC PANEL
ALBUMIN: 3.7 g/dL (ref 3.5–5.0)
ALT: 18 U/L (ref 0–55)
AST: 16 U/L (ref 5–34)
Alkaline Phosphatase: 173 U/L — ABNORMAL HIGH (ref 40–150)
Anion Gap: 13 mEq/L — ABNORMAL HIGH (ref 3–11)
BUN: 52.5 mg/dL — ABNORMAL HIGH (ref 7.0–26.0)
CO2: 24 meq/L (ref 22–29)
Calcium: 9.5 mg/dL (ref 8.4–10.4)
Chloride: 98 mEq/L (ref 98–109)
Creatinine: 2.4 mg/dL — ABNORMAL HIGH (ref 0.6–1.1)
EGFR: 18 mL/min/{1.73_m2} — AB (ref >90)
GLUCOSE: 345 mg/dL — AB (ref 70–140)
POTASSIUM: 4.3 meq/L (ref 3.5–5.1)
SODIUM: 135 meq/L — AB (ref 136–145)
TOTAL PROTEIN: 6.9 g/dL (ref 6.4–8.3)
Total Bilirubin: 1.24 mg/dL — ABNORMAL HIGH (ref 0.20–1.20)

## 2015-07-18 MED ORDER — SODIUM CHLORIDE 0.9 % IV SOLN
1000.0000 mL | Freq: Once | INTRAVENOUS | Status: AC
  Administered 2015-07-18: 1000 mL via INTRAVENOUS

## 2015-07-18 NOTE — Telephone Encounter (Signed)
per pof to sch pt appt-gave pt copy of avs °

## 2015-07-18 NOTE — Patient Instructions (Signed)

## 2015-07-18 NOTE — Progress Notes (Signed)
81 year old female diagnosed with abnormal ERCP.  She is a patient of Dr. Irene Limbo.  Past medical history includes depression, GERD, hyperlipidemia, hypertension, osteopenia, and renal insufficiency.  Medications include Lipitor, Celexa, Decadron, Lasix, Creon, Ativan, multivitamin, Prilosec.  Labs include glucose 176, BUN 42, and creatinine 1.8.  Height: 5 feet 1/4 inch. Weight: 187.6 pounds. Usual body weight: 215 pounds in March. BMI: 36.35.  Patient complains of poor energy and weakness. States she has "blackouts" when she stands up. Endorses poor oral intake. She enjoys ensure 1-2 daily. Patient denies nausea, vomiting, diarrhea or constipation.  Nutrition diagnosis: Unintended weight loss related to inadequate oral intake as evidenced by 13% weight loss in 9 months.  Intervention: Patient was educated to consume smaller more frequent meals and snacks utilizing high-calorie high-protein foods. Encouraged patient to consume Ensure Plus 3 times a day between meals. Provided samples and coupons. Educated patient on how lack of food was contributing to poor energy, weakness. Questions were answered.  Teach back method used.  Contact information given.  Monitoring, evaluation, goals: Patient will tolerate increased calories and protein to minimize further weight loss.  Next visit: To be scheduled as needed.  **Disclaimer: This note was dictated with voice recognition software. Similar sounding words can inadvertently be transcribed and this note may contain transcription errors which may not have been corrected upon publication of note.**

## 2015-07-18 NOTE — Progress Notes (Signed)
Marland Kitchen    HEMATOLOGY/ONCOLOGY CLINIC NOTE  Date of Service: .07/18/2015  Patient Care Team: Doe-Hyun Kyra Searles, DO as PCP - General (Internal Medicine) Corliss Parish, MD as Consulting Physician (Nephrology)  CHIEF COMPLAINTS/PURPOSE OF CONSULTATION:  Newly diagnosed pancreatico-biliary adenocarcinoma  HISTORY OF PRESENTING ILLNESS: Please see my initial consultation for details on initial presentation.  Interval history.  Patient is here for follow-up of her newly diagnosed pancreatic cancer. She had a PET scan that shows a localized tumor in the pancreatic head measuring about 3.3 cm with no overt evidence of metastasis or lymph node involvement. She has had poor appetite despite trying the dexamethasone as not been drinking as much water.she notes that her chronic leg swelling has gone down. She continues to lose weight. Notes that her urine has been darker. Patient's blood pressure was down 100/33 when she came to the clinic today and she appeared quite dehydrated. She received 1 L of IV fluids with improvement in her blood pressure is 140/65. Her dizziness resolved with the IV fluids. Her ARB and diuretics were held.she was instructed to improve oral intake. We've had extensive discussion about goals of care after discussing the findings of her PET/CT scan. She is willing to have a discussion with the surgeons regarding possibility of surgery on technical as well as overall medical condition grounds.she understands that she probably would not be the best candidate for surgery given her overall condition and the fact that her PET/CT scan also shows indirect evidence of triple-vessel disease. If she does consider surgery and is considered for such surgeons she will likely need to be cleared from a cardiac standpoint by her primary care physician.  She understands that a PET scan was done with a high blood sugar level and could be less sensitive. She and her family regarding of the concern of how  she would be able to function at home if she were to continue getting weaker. A home health referral was provided and the social worker was made aware for home safety evaluation. No other acute new focal symptoms. Bowel movements have been regular. Patient has been drinking 1-2 nutritional supplements a day.    MEDICAL HISTORY:  Past Medical History  Diagnosis Date  . Depression   . GERD (gastroesophageal reflux disease)   . Hyperlipidemia   . Hypertension   . Osteopenia   . Renal insufficiency 2007    creat 1.3  . Hiatal hernia   . LBP (low back pain)   . UNSPECIFIED PERIPHERAL VASCULAR DISEASE 06/08/2010  . ANEMIA-UNSPECIFIED 07/03/2009  . HYPERGLYCEMIA 02/26/2007  . GERD 02/26/2007  History of pancreatitis in 2012 History of colon polyps 8-9 years ago Mammogram April 2016 was negative  SURGICAL HISTORY: Past Surgical History  Procedure Laterality Date  . Appendectomy  1962  . Cholecystectomy    . Oophorectomy  1962  . Parathyroidectomy    . Dilation and curettage of uterus  1962  . Wrist fracture surgery  10/01  . Tail bone surgery    . Esophagogastroduodenoscopy      with dilatation Dr Fuller Plan  . Tonsillectomy and adenoidectomy    . Ingrown toenails       x3  . Ercp N/A 06/22/2015    Procedure: ENDOSCOPIC RETROGRADE CHOLANGIOPANCREATOGRAPHY (ERCP);  Surgeon: Ladene Artist, MD;  Location: [facility];  Service: Endoscopy;  Laterality: N/A;    SOCIAL HISTORY: Social History   Social History  . Marital Status: Widowed    Spouse Name: N/A  . Number  of Children: 0  . Years of Education: N/A   Occupational History  . Retired    Social History Main Topics  . Smoking status: Never Smoker   . Smokeless tobacco: Not on file  . Alcohol Use: No  . Drug Use: No  . Sexual Activity: Not on file   Other Topics Concern  . Not on file   Social History Narrative   Physician roster:      Nephrologist-Dr. Lyda Kalata   Ophthalmologists-Dr. Harlan  specialist-Dr. Dara Lords - husband passed 21 years ago.  No children   Lives in small senior apartment. Small step w/handrail to enter home   Ambulates in home and performs all ADL's-walker as needed   No longer drives: brother, Abe People usually drives her   Enjoys reading Bible and watching christian television    FAMILY HISTORY: Family History  Problem Relation Age of Onset  . Kidney disease Father     failure  . Colon cancer Sister     ALLERGIES:  is allergic to diltiazem hcl.  MEDICATIONS:  Current Outpatient Prescriptions  Medication Sig Dispense Refill  . atorvastatin (LIPITOR) 40 MG tablet Take 1 tablet (40 mg total) by mouth daily. 90 tablet 1  . calcium carbonate (TUMS - DOSED IN MG ELEMENTAL CALCIUM) 500 MG chewable tablet Chew 1 tablet by mouth daily as needed for heartburn.    . citalopram (CELEXA) 20 MG tablet TAKE 1 TABLET BY MOUTH DAILY. 90 tablet 1  . dexamethasone (DECADRON) 4 MG tablet   0  . fluticasone (FLONASE) 50 MCG/ACT nasal spray Place 2 sprays into both nostrils daily. 16 g 1  . labetalol (NORMODYNE) 100 MG tablet TAKE 1 TABLET BY MOUTH DAILY. 90 tablet 2  . lipase/protease/amylase (CREON) 12000 UNITS CPEP capsule Take 1 capsule (12,000 Units total) by mouth 3 (three) times daily before meals. 270 capsule 1  . LORazepam (ATIVAN) 0.5 MG tablet TAKE 1 TABLET BY MOUTH DAILY AS NEEDED FOR ANXIETY (Patient taking differently: TAKE 1 TABLET BY MOUTH AT BEDTIME) 30 tablet 3  . losartan (COZAAR) 25 MG tablet TAKE 1 TABLET BY MOUTH DAILY. 30 tablet 11  . Multiple Vitamin (MULTIVITAMIN) tablet Take 1 tablet by mouth daily.      Marland Kitchen omeprazole (PRILOSEC) 40 MG capsule TAKE 1 CAPSULE BY MOUTH DAILY. 90 capsule 1  . oxybutynin (DITROPAN XL) 15 MG 24 hr tablet Take 15 mg by mouth at bedtime.    . polyethylene glycol powder (GLYCOLAX/MIRALAX) powder Take 17 g by mouth 2 (two) times daily as needed. 3350 g 3  . senna-docusate (SENNA S) 8.6-50 MG tablet Take 2  tablets by mouth at bedtime. 60 tablet 1  . dexamethasone (DECADRON) 2 MG tablet Take 1 tablet (2 mg total) by mouth daily. 30 tablet 0   No current facility-administered medications for this visit.    REVIEW OF SYSTEMS:    10 Point review of Systems was done is negative except as noted above.  PHYSICAL EXAMINATION: ECOG PERFORMANCE STATUS: 2-3  . Filed Vitals:   07/18/15 1247 07/18/15 1429  BP: 90/40 142/47  Pulse:  71  Temp:    Resp:  20   Filed Weights   07/18/15 1017  Weight: 187 lb 9.6 oz (85.095 kg)   .Body mass index is 36.35 kg/(m^2).  Marland Kitchen Wt Readings from Last 3 Encounters:  07/18/15 187 lb 9.6 oz (85.095 kg)  07/04/15 192 lb 3.2 oz (87.181 kg)  06/22/15  193 lb (87.544 kg)    GENERAL:elderly somewhat fatigued appearing lady, alert, in no acute distress and comfortable SKIN: skin color, texture, turgor are normal, no rashes or significant lesions EYES: normal, conjunctiva are pink and non-injected, sclera clear OROPHARYNX:no exudate, no erythema and lips, buccal mucosa, and tongue normal  NECK: supple, no JVD, thyroid normal size, non-tender, without nodularity LYMPH:  no palpable lymphadenopathy in the cervical, axillary or inguinal LUNGS: clear to auscultation with normal respiratory effort HEART: regular rate & rhythm,  no murmurs and no lower extremity edema ABDOMEN: abdomen soft, non-tender, normoactive bowel sounds  Musculoskeletal: no cyanosis of digits and no clubbing  PSYCH: alert & oriented x 3 with fluent speech NEURO: no focal motor/sensory deficits  LABORATORY DATA:  I have reviewed the data as listed  .Marland Kitchen CBC Latest Ref Rng 07/18/2015 07/04/2015 06/19/2015  WBC 3.9 - 10.3 10e3/uL 9.1 6.3 7.1  Hemoglobin 11.6 - 15.9 g/dL 11.3(L) 10.9(L) 11.1(L)  Hematocrit 34.8 - 46.6 % 34.3(L) 32.9(L) 33.6(L)  Platelets 145 - 400 10e3/uL 144(L) 176 279.0     . CMP Latest Ref Rng 07/18/2015 07/04/2015 06/19/2015  Glucose 70 - 140 mg/dl 345(H) 176(H)  183(H)  BUN 7.0 - 26.0 mg/dL 52.5(H) 42.0(H) 41(H)  Creatinine 0.6 - 1.1 mg/dL 2.4(H) 1.8(H) 2.13(H)  Sodium 136 - 145 mEq/L 135(L) 139 141  Potassium 3.5 - 5.1 mEq/L 4.3 5.0 5.0  Chloride 96 - 112 mEq/L - - 98  CO2 22 - 29 mEq/L 24 27 29   Calcium 8.4 - 10.4 mg/dL 9.5 9.9 9.8  Total Protein 6.4 - 8.3 g/dL 6.9 7.4 7.0  Total Bilirubin 0.20 - 1.20 mg/dL 1.24(H) 1.36(H) 4.2(H)  Alkaline Phos 40 - 150 U/L 173(H) 399(H) 1152(H)  AST 5 - 34 U/L 16 20 231(H)  ALT 0 - 55 U/L 18 25 182(H)    . Lab Results  Component Value Date   CA199 84.5* 07/04/2015       RADIOGRAPHIC STUDIES: I have personally reviewed the radiological images as listed and agreed with the findings in the report. Ct Abdomen Wo Contrast  06/20/2015  CLINICAL DATA:  81 year old with 2 week history of lethargy, anorexia and jaundice. Elevated liver function tests on laboratory analysis. Progressively worsening chronic renal insufficiency (creatinine 2.13, estimated GFR 24) precluded IV contrast. EXAM: CT ABDOMEN WITHOUT CONTRAST TECHNIQUE: Multidetector CT imaging of the abdomen was performed following the standard protocol without IV contrast. Oral contrast was administered. COMPARISON:  CT abdomen and pelvis 04/09/2011. FINDINGS: Lower chest: Heart moderately enlarged. Mitral annular calcification. Visualized lung bases clear. Hepatobiliary: Gallbladder surgically absent. Interval development of marked dilation of the common bile duct and common hepatic duct, though the duct is of normal caliber distally. Mild to moderate central intrahepatic ductal dilation. No focal hepatic parenchymal abnormality, allowing for the unenhanced technique. Pancreas: Atrophic body and tail. Likely mass involving the head of the pancreas, accounting for the biliary ductal dilation, measuring approximately 2.2 x 2.8 x 3.1 cm, not present on the prior examination. Spleen:  Normal unenhanced appearance. Adrenal/Urinary Tract: Normal adrenal glands.  Exophytic simple cyst arising from the posterior upper pole right kidney measuring approximately 2.2 cm, increased in size since 2012. No significant abnormality involving either kidney allowing for the unenhanced technique. No opaque upper urinary tract calculi. Gastrointestinal: Stomach normal in appearance for degree of distention. Mild wall thickening involving the visualized distal esophagus and oral contrast material within the visualized distal esophagus. Visualized small bowel normal in appearance. Large stool burden throughout the  visualized colon without morphologic abnormality. No ascites. Lymphatics/Vascular: No pathologic lymphadenopathy. Aortoiliac atherosclerosis without aneurysm. Other: Umbilical hernia containing fat, the defect measuring approximately 3 cm. Musculoskeletal: Osseous demineralization. Degenerative disc disease, spondylosis and DISH involving the lower thoracic spine. Facet degenerative changes involving the visualized lumbar spine. IMPRESSION: 1. Approximate 3 cm mass suspected involving the pancreatic head, accounting for marked dilation of the common bile duct and common hepatic duct. MRI abdomen/MRCP may be helpful in further evaluation to confirm or deny this finding. 2. No acute abnormalities otherwise, allowing for the unenhanced technique. 3. Large stool burden involving the visualized colon. 4. Oral contrast material in the visualized distal esophagus with with associated wall thickening, query GE reflux disease. Electronically Signed   By: Evangeline Dakin M.D.   On: 06/20/2015 14:54   Nm Pet Image Initial (pi) Skull Base To Thigh  07/13/2015  CLINICAL DATA:  Initial treatment strategy for pancreatic cancer. Initial staging examination. EXAM: NUCLEAR MEDICINE PET SKULL BASE TO THIGH TECHNIQUE: 10.5 mCi F-18 FDG was injected intravenously. Full-ring PET imaging was performed from the skull base to thigh after the radiotracer. CT data was obtained and used for attenuation  correction and anatomic localization. FASTING BLOOD GLUCOSE:  Value: 231 mg/dl COMPARISON:  CT of the abdomen pelvis 06/20/2015. FINDINGS: NECK No hypermetabolic lymph nodes in the neck. CHEST No hypermetabolic mediastinal or hilar nodes. No suspicious pulmonary nodules on the CT scan. Heart size is mildly enlarged. There is no significant pericardial fluid, thickening or pericardial calcification. There is atherosclerosis of the thoracic aorta, the great vessels of the mediastinum and the coronary arteries, including calcified atherosclerotic plaque in the left main, left anterior descending, left circumflex and right coronary arteries. Mild calcifications of the aortic valve and mitral annulus. Dilatation of the pulmonic trunk (3.8 cm in diameter). ABDOMEN/PELVIS Compared to the prior examination there has been interval placement of a metallic stent in the common bile duct. Pneumobilia now noted throughout the intrahepatic biliary tree. Previously noted pancreatic mass in the superior aspect of the pancreatic head is again noted, estimated to measure approximately 2.3 x 3.3 cm (image 119 of series 5), and is diffusely hypermetabolic (SUVmax = 7.6). The body and tail of the pancreas are atrophic. No abnormal hypermetabolic activity within the liver, adrenal glands, or spleen. No hypermetabolic lymph nodes in the abdomen or pelvis. Exophytic 1.9 cm low-attenuation lesion in the posterior aspect of the upper pole of the right kidney is incompletely characterized on today's noncontrast CT examination, but demonstrates no hypermetabolism, presumably a small cyst. No significant volume of ascites. No pneumoperitoneum. Small umbilical hernia containing only omental fat. SKELETON No focal hypermetabolic activity to suggest skeletal metastasis. IMPRESSION: 1. 2.3 x 3.3 cm hypermetabolic mass in the head of the pancreas compatible with the biopsy-proven adenocarcinoma. No definite evidence of metastatic disease in the neck,  chest, abdomen or pelvis. 2. Interval placement of a common bile duct stent, with new pneumobilia in the liver. 3. Cardiomegaly. 4. Dilatation of the pulmonic trunk (3.8 cm), suggesting pulmonary arterial hypertension. 5. Atherosclerosis, including left main and 3 vessel coronary artery disease. 6. Additional incidental findings, as above. Electronically Signed   By: Vinnie Langton M.D.   On: 07/13/2015 16:13   Dg Ercp  06/22/2015  CLINICAL DATA:  Pancreatic mass EXAM: ERCP TECHNIQUE: Multiple spot images obtained with the fluoroscopic device and submitted for interpretation post-procedure. FLUOROSCOPY TIME:  Radiation Exposure Index (as provided by the fluoroscopic device): If the device does not provide the exposure  index: Fluoroscopy Time:  2 minutes and 4 seconds Number of Acquired Images:  8 COMPARISON:  None. FINDINGS: Images demonstrate metal stent placement across the pancreatic mass within the common bile duct. IMPRESSION: See above. These images were submitted for radiologic interpretation only. Please see the procedural report for the amount of contrast and the fluoroscopy time utilized. Electronically Signed   By: Marybelle Killings M.D.   On: 06/22/2015 08:58    ASSESSMENT & PLAN:   81 year old elderly lady with multiple medical comorbidities including hypertension, dyslipidemia, chronic kidney disease with  #1 T2N0M0 Stage IB Pancreatic Adenocarcinoma. Based on radiology this appears to be primarily a pancreatic head mass however the pathology was obtained from bili duct brushings likely from invasive pancreatic cancer. PET scan shows no evidence of local lymph node involvement or metastases but is somewhat less sensitive given the patient's pre-test blood sugar level was 231. Patient has had rapid weight loss with fatigue and anorexia. Has lost about 30 pounds in the last 6 weeks or so. No abdominal pain. Some concern for exocrine pancreatic insufficiency. CA-19-9 level somewhat elevated  at 84.5 #2 Abnormal liver function tests due to CBD obstruction from pancreatic head mass and noted biliary duct stricture. Patient's liver function continue to improve status post ERCP based metal stent placement. #3 severe protein calorie malnutrition. No significant improvement in oral intake with dexamethasone. Patient has had very marginal intake and continues to lose weight. It appears that she is also had difficulties maintaining adequate fluid intake and on labs appears to have gotten dehydrated with some acute on chronic renal insufficiency. #4. Hypotension related to hypovolemia due to poor oral intake worsened by her use of Lasix and losartan. #5 failure to thrive due to increasing debility from her pancreatic cancer. Plan  -Patient given 1 L of IV fluids with normalization of her blood pressure and resolution of her dizziness. -Her Lasix was discontinued at this time and so was her losartan. -She was asked to closely water her blood pressure with her labetalol which also might need to be dialed back if her poor oral intake continues. -She was asked to call our clinic immediately if she started getting dizzy again. -We had a detailed discussion of goals of care. -I have given her a referral to see Dr. Barry Dienes from hepatobiliary surgery to determine if she might be a surgical candidate no given her overall condition she seems to not be the best of surgical candidates even though she might have resectable disease. -Her PET/CT scan shows incidental findings of triple-vessel disease. Patient has had no anginal symptoms. If she is planned for surgery she will likely need cardiac clearance from her primary care physician. -Encouraged to continue optimizing oral intake. -Home health services referral placed and social worker informed to help set of the services since the patient lives at home alone and is facing difficulties with self cares in the setting of worsening debility. She chooses to  avoid going to a nursing home unless absolutely necessary and would like to live in her own place. -I have discussed her case with. Dr. Kathlene Cote from interventional radiology no  locally ablative procedures given risk of pancreatitis -will need to monitor Blood sugar to determine presence of endocrine insuf as well -continue creon. --she again express lack of interest in surgery but would like to complete exploring that option. -If she is not candidate for surgery we will have to discuss with her the option of possible palliative chemotherapy vs palliative RT vs  best supportive cares. -will let surgery decide regarding need for additional local staging with EUS if surgery if on the table. -ongoing goals of care discussion.  Follow-up with surgery within a week and return to care with me in 2 weeks with CBC, CMP. Earlier if any new concerns. Continue follow-up with primary care physician for management of other chronic medical issues.  All of the patients And her family's questions were answered in details to their apparent satisfaction. The patient knows to call the clinic with any problems, questions or concerns.  I spent 25 minutes counseling the patient face to face. The total time spent in the appointment was 30 minutes and more than 50% was on counseling and direct patient cares.    Sullivan Lone MD [place] AAHIVMS [facility] Hematology/Oncology Physician [facility]  (Office):       [phone] (Work cell):  [phone] (Fax):           [phone]  07/18/2015 2:47 PM

## 2015-07-19 ENCOUNTER — Other Ambulatory Visit: Payer: Self-pay

## 2015-07-19 ENCOUNTER — Telehealth: Payer: Self-pay

## 2015-07-19 ENCOUNTER — Telehealth: Payer: Self-pay | Admitting: *Deleted

## 2015-07-19 DIAGNOSIS — C259 Malignant neoplasm of pancreas, unspecified: Secondary | ICD-10-CM

## 2015-07-19 NOTE — Telephone Encounter (Signed)
EUS scheduled, pt instructed and medications reviewed.  Patient instructions mailed to home.  Patient to call with any questions or concerns.  

## 2015-07-19 NOTE — Telephone Encounter (Signed)
Oncology Nurse Navigator Documentation  Oncology Nurse Navigator Flowsheets 07/19/2015  Referral date to RadOnc/MedOnc -  Navigator Encounter Type -  Patient Visit Type -  Treatment Phase -  Barriers/Navigation Needs -  Education -  Interventions Coordination of Care  Referrals Home Health  Education Method -  Support Groups/Services -  Time Spent with Patient -  Linda Bentley with [facility] and called attention to referral from Dr. Irene Limbo for Home Health-Nursing and PT.

## 2015-07-19 NOTE — Telephone Encounter (Signed)
-----   Message from [name], MD sent at 07/19/2015 10:14 AM EST ----- Happy to. We'll get it scheduled.  Thanks    Maimuna Leaman, she needs upper EUS, radial only, +MAC, next week I think there is time.  For staging pancreatic cancer.  Thanks  dj ----- Message -----    From: Stark Klein, MD    Sent: 07/19/2015   9:09 AM      To: [name], MD, Tania Ade, RN, #  Dan,  Can we EUS this lady to look at the vessels? She has a biopsy already that is positive. tx [facility]   ----- Message -----    From: Brunetta Genera, MD    Sent: 07/18/2015   3:13 PM      To: Stark Klein, MD  Dear Doctors, I appreciate the privilege of taking care of your patient. I would like to forward my notes for your consideration. Kindly let me know if there are any other questions or concerns.  Regards, Sullivan Lone MD [place] Hematology/Oncology Physician [facility]  (Office):       [phone] (Work cell):  [phone] (Fax):           [phone]

## 2015-07-20 ENCOUNTER — Encounter (HOSPITAL_COMMUNITY): Payer: Self-pay | Admitting: *Deleted

## 2015-07-20 DIAGNOSIS — I10 Essential (primary) hypertension: Secondary | ICD-10-CM | POA: Diagnosis not present

## 2015-07-20 DIAGNOSIS — C259 Malignant neoplasm of pancreas, unspecified: Secondary | ICD-10-CM | POA: Diagnosis not present

## 2015-07-20 DIAGNOSIS — F329 Major depressive disorder, single episode, unspecified: Secondary | ICD-10-CM | POA: Diagnosis not present

## 2015-07-20 DIAGNOSIS — N289 Disorder of kidney and ureter, unspecified: Secondary | ICD-10-CM | POA: Diagnosis not present

## 2015-07-20 DIAGNOSIS — D649 Anemia, unspecified: Secondary | ICD-10-CM | POA: Diagnosis not present

## 2015-07-20 DIAGNOSIS — I739 Peripheral vascular disease, unspecified: Secondary | ICD-10-CM | POA: Diagnosis not present

## 2015-07-20 DIAGNOSIS — M545 Low back pain: Secondary | ICD-10-CM | POA: Diagnosis not present

## 2015-07-20 DIAGNOSIS — K219 Gastro-esophageal reflux disease without esophagitis: Secondary | ICD-10-CM | POA: Diagnosis not present

## 2015-07-20 DIAGNOSIS — E785 Hyperlipidemia, unspecified: Secondary | ICD-10-CM | POA: Diagnosis not present

## 2015-07-21 DIAGNOSIS — I739 Peripheral vascular disease, unspecified: Secondary | ICD-10-CM | POA: Diagnosis not present

## 2015-07-21 DIAGNOSIS — N289 Disorder of kidney and ureter, unspecified: Secondary | ICD-10-CM | POA: Diagnosis not present

## 2015-07-21 DIAGNOSIS — C259 Malignant neoplasm of pancreas, unspecified: Secondary | ICD-10-CM | POA: Diagnosis not present

## 2015-07-21 DIAGNOSIS — M545 Low back pain: Secondary | ICD-10-CM | POA: Diagnosis not present

## 2015-07-21 DIAGNOSIS — I10 Essential (primary) hypertension: Secondary | ICD-10-CM | POA: Diagnosis not present

## 2015-07-21 DIAGNOSIS — D649 Anemia, unspecified: Secondary | ICD-10-CM | POA: Diagnosis not present

## 2015-07-21 DIAGNOSIS — F329 Major depressive disorder, single episode, unspecified: Secondary | ICD-10-CM | POA: Diagnosis not present

## 2015-07-21 DIAGNOSIS — K219 Gastro-esophageal reflux disease without esophagitis: Secondary | ICD-10-CM | POA: Diagnosis not present

## 2015-07-21 DIAGNOSIS — E785 Hyperlipidemia, unspecified: Secondary | ICD-10-CM | POA: Diagnosis not present

## 2015-07-25 ENCOUNTER — Telehealth: Payer: Self-pay | Admitting: *Deleted

## 2015-07-25 DIAGNOSIS — K219 Gastro-esophageal reflux disease without esophagitis: Secondary | ICD-10-CM | POA: Diagnosis not present

## 2015-07-25 DIAGNOSIS — E785 Hyperlipidemia, unspecified: Secondary | ICD-10-CM | POA: Diagnosis not present

## 2015-07-25 DIAGNOSIS — I10 Essential (primary) hypertension: Secondary | ICD-10-CM | POA: Diagnosis not present

## 2015-07-25 DIAGNOSIS — M545 Low back pain: Secondary | ICD-10-CM | POA: Diagnosis not present

## 2015-07-25 DIAGNOSIS — F329 Major depressive disorder, single episode, unspecified: Secondary | ICD-10-CM | POA: Diagnosis not present

## 2015-07-25 DIAGNOSIS — D649 Anemia, unspecified: Secondary | ICD-10-CM | POA: Diagnosis not present

## 2015-07-25 DIAGNOSIS — N289 Disorder of kidney and ureter, unspecified: Secondary | ICD-10-CM | POA: Diagnosis not present

## 2015-07-25 DIAGNOSIS — I739 Peripheral vascular disease, unspecified: Secondary | ICD-10-CM | POA: Diagnosis not present

## 2015-07-25 DIAGNOSIS — C259 Malignant neoplasm of pancreas, unspecified: Secondary | ICD-10-CM | POA: Diagnosis not present

## 2015-07-25 NOTE — Telephone Encounter (Signed)
  Oncology Nurse Navigator Documentation    Navigator Encounter Type: Telephone (07/25/15 1020): confirmed with her that she is aware of her appointment with Dr. Barry Dienes on 08/02/15 at 1:45 pm. Confirmed that home health nurse and PT has been out to see her and is expecting the OT today. EUS scheduled for 12/22.

## 2015-07-26 DIAGNOSIS — I10 Essential (primary) hypertension: Secondary | ICD-10-CM | POA: Diagnosis not present

## 2015-07-26 DIAGNOSIS — D649 Anemia, unspecified: Secondary | ICD-10-CM | POA: Diagnosis not present

## 2015-07-26 DIAGNOSIS — I739 Peripheral vascular disease, unspecified: Secondary | ICD-10-CM | POA: Diagnosis not present

## 2015-07-26 DIAGNOSIS — N289 Disorder of kidney and ureter, unspecified: Secondary | ICD-10-CM | POA: Diagnosis not present

## 2015-07-26 DIAGNOSIS — F329 Major depressive disorder, single episode, unspecified: Secondary | ICD-10-CM | POA: Diagnosis not present

## 2015-07-26 DIAGNOSIS — E785 Hyperlipidemia, unspecified: Secondary | ICD-10-CM | POA: Diagnosis not present

## 2015-07-26 DIAGNOSIS — C259 Malignant neoplasm of pancreas, unspecified: Secondary | ICD-10-CM | POA: Diagnosis not present

## 2015-07-26 DIAGNOSIS — K219 Gastro-esophageal reflux disease without esophagitis: Secondary | ICD-10-CM | POA: Diagnosis not present

## 2015-07-26 DIAGNOSIS — M545 Low back pain: Secondary | ICD-10-CM | POA: Diagnosis not present

## 2015-07-27 ENCOUNTER — Ambulatory Visit (HOSPITAL_COMMUNITY): Payer: Medicare Other | Admitting: Anesthesiology

## 2015-07-27 ENCOUNTER — Ambulatory Visit (HOSPITAL_BASED_OUTPATIENT_CLINIC_OR_DEPARTMENT_OTHER)
Admission: RE | Admit: 2015-07-27 | Discharge: 2015-07-27 | Disposition: A | Discharge status: Home / Self Care | Payer: Medicare Other | Source: Ambulatory Visit | Attending: Gastroenterology | Admitting: Gastroenterology

## 2015-07-27 ENCOUNTER — Encounter (HOSPITAL_COMMUNITY): Payer: Self-pay

## 2015-07-27 ENCOUNTER — Encounter (HOSPITAL_COMMUNITY)
Admission: RE | Disposition: A | Discharge status: Home / Self Care | Payer: Self-pay | Source: Ambulatory Visit | Attending: Gastroenterology

## 2015-07-27 DIAGNOSIS — K8689 Other specified diseases of pancreas: Secondary | ICD-10-CM | POA: Insufficient documentation

## 2015-07-27 DIAGNOSIS — Z888 Allergy status to other drugs, medicaments and biological substances status: Secondary | ICD-10-CM | POA: Diagnosis not present

## 2015-07-27 DIAGNOSIS — E785 Hyperlipidemia, unspecified: Secondary | ICD-10-CM | POA: Insufficient documentation

## 2015-07-27 DIAGNOSIS — M8580 Other specified disorders of bone density and structure, unspecified site: Secondary | ICD-10-CM | POA: Insufficient documentation

## 2015-07-27 DIAGNOSIS — E870 Hyperosmolality and hypernatremia: Secondary | ICD-10-CM | POA: Diagnosis not present

## 2015-07-27 DIAGNOSIS — K5900 Constipation, unspecified: Secondary | ICD-10-CM

## 2015-07-27 DIAGNOSIS — C250 Malignant neoplasm of head of pancreas: Secondary | ICD-10-CM

## 2015-07-27 DIAGNOSIS — Z79899 Other long term (current) drug therapy: Secondary | ICD-10-CM

## 2015-07-27 DIAGNOSIS — M545 Low back pain: Secondary | ICD-10-CM | POA: Diagnosis not present

## 2015-07-27 DIAGNOSIS — I129 Hypertensive chronic kidney disease with stage 1 through stage 4 chronic kidney disease, or unspecified chronic kidney disease: Secondary | ICD-10-CM | POA: Diagnosis not present

## 2015-07-27 DIAGNOSIS — E669 Obesity, unspecified: Secondary | ICD-10-CM

## 2015-07-27 DIAGNOSIS — K219 Gastro-esophageal reflux disease without esophagitis: Secondary | ICD-10-CM

## 2015-07-27 DIAGNOSIS — N189 Chronic kidney disease, unspecified: Secondary | ICD-10-CM | POA: Insufficient documentation

## 2015-07-27 DIAGNOSIS — R350 Frequency of micturition: Secondary | ICD-10-CM | POA: Diagnosis not present

## 2015-07-27 DIAGNOSIS — E0810 Diabetes mellitus due to underlying condition with ketoacidosis without coma: Secondary | ICD-10-CM | POA: Diagnosis not present

## 2015-07-27 DIAGNOSIS — Z9049 Acquired absence of other specified parts of digestive tract: Secondary | ICD-10-CM | POA: Insufficient documentation

## 2015-07-27 DIAGNOSIS — E860 Dehydration: Secondary | ICD-10-CM | POA: Diagnosis not present

## 2015-07-27 DIAGNOSIS — Z7951 Long term (current) use of inhaled steroids: Secondary | ICD-10-CM | POA: Insufficient documentation

## 2015-07-27 DIAGNOSIS — N184 Chronic kidney disease, stage 4 (severe): Secondary | ICD-10-CM | POA: Diagnosis not present

## 2015-07-27 DIAGNOSIS — E1169 Type 2 diabetes mellitus with other specified complication: Secondary | ICD-10-CM | POA: Diagnosis not present

## 2015-07-27 DIAGNOSIS — I739 Peripheral vascular disease, unspecified: Secondary | ICD-10-CM

## 2015-07-27 DIAGNOSIS — Z800 Family history of malignant neoplasm of digestive organs: Secondary | ICD-10-CM | POA: Diagnosis not present

## 2015-07-27 DIAGNOSIS — Z841 Family history of disorders of kidney and ureter: Secondary | ICD-10-CM | POA: Diagnosis not present

## 2015-07-27 DIAGNOSIS — E876 Hypokalemia: Secondary | ICD-10-CM | POA: Diagnosis not present

## 2015-07-27 DIAGNOSIS — E1310 Other specified diabetes mellitus with ketoacidosis without coma: Secondary | ICD-10-CM | POA: Diagnosis not present

## 2015-07-27 DIAGNOSIS — F329 Major depressive disorder, single episode, unspecified: Secondary | ICD-10-CM | POA: Diagnosis not present

## 2015-07-27 DIAGNOSIS — Z7952 Long term (current) use of systemic steroids: Secondary | ICD-10-CM | POA: Diagnosis not present

## 2015-07-27 DIAGNOSIS — Z66 Do not resuscitate: Secondary | ICD-10-CM | POA: Diagnosis not present

## 2015-07-27 DIAGNOSIS — E872 Acidosis: Secondary | ICD-10-CM | POA: Diagnosis not present

## 2015-07-27 DIAGNOSIS — R5383 Other fatigue: Secondary | ICD-10-CM | POA: Diagnosis not present

## 2015-07-27 DIAGNOSIS — C259 Malignant neoplasm of pancreas, unspecified: Secondary | ICD-10-CM | POA: Diagnosis not present

## 2015-07-27 HISTORY — PX: EUS: SHX5427

## 2015-07-27 SURGERY — UPPER ENDOSCOPIC ULTRASOUND (EUS) RADIAL
Anesthesia: Monitor Anesthesia Care

## 2015-07-27 MED ORDER — LIDOCAINE HCL (CARDIAC) 20 MG/ML IV SOLN
INTRAVENOUS | Status: AC
  Filled 2015-07-27: qty 5

## 2015-07-27 MED ORDER — PROMETHAZINE HCL 25 MG/ML IJ SOLN: 6.2500 mg | INTRAMUSCULAR | Status: DC | PRN

## 2015-07-27 MED ORDER — LIDOCAINE HCL (CARDIAC) 20 MG/ML IV SOLN
INTRAVENOUS | Status: DC | PRN
  Administered 2015-07-27: 50 mg via INTRAVENOUS

## 2015-07-27 MED ORDER — PROPOFOL 10 MG/ML IV BOLUS
INTRAVENOUS | Status: DC | PRN
  Administered 2015-07-27: 10 mg via INTRAVENOUS
  Administered 2015-07-27: 30 mg via INTRAVENOUS
  Administered 2015-07-27 (×2): 20 mg via INTRAVENOUS
  Administered 2015-07-27 (×2): 50 mg via INTRAVENOUS
  Administered 2015-07-27: 20 mg via INTRAVENOUS

## 2015-07-27 MED ORDER — PROPOFOL 10 MG/ML IV BOLUS
INTRAVENOUS | Status: AC
  Filled 2015-07-27: qty 40

## 2015-07-27 MED ORDER — SODIUM CHLORIDE 0.9 % IV SOLN
INTRAVENOUS | Status: DC
  Administered 2015-07-27: 1000 mL via INTRAVENOUS

## 2015-07-27 NOTE — Interval H&P Note (Signed)
History and Physical Interval Note:  07/27/2015 8:09 AM  Linda Bentley  has presented today for surgery, with the diagnosis of pancreatic cancer  The various methods of treatment have been discussed with the patient and family. After consideration of risks, benefits and other options for treatment, the patient has consented to  Procedure(s): UPPER ENDOSCOPIC ULTRASOUND (EUS) RADIAL (N/A) as a surgical intervention .  The patient's history has been reviewed, patient examined, no change in status, stable for surgery.  I have reviewed the patient's chart and labs.  Questions were answered to the patient's satisfaction.     [name]

## 2015-07-27 NOTE — H&P (View-Only) (Signed)
Marland Kitchen    HEMATOLOGY/ONCOLOGY CONSULTATION NOTE  Date of Service: 07/04/2015  Patient Care Team: Doe-Hyun Kyra Searles, DO as PCP - General (Internal Medicine) Corliss Parish, MD as Consulting Physician (Nephrology)  CHIEF COMPLAINTS/PURPOSE OF CONSULTATION:  Newly diagnosed pancreatico-biliary adenocarcinoma  HISTORY OF PRESENTING ILLNESS:  Linda Bentley is a wonderful 81 y.o. female who has been referred to [place] by Dr .Drema Pry, DO for evaluation and management of newly diagnosed pancreaticobiliary adenocarcinoma.  Patient has a history of hypertension, dyslipidemia, chronic renal insufficiency who presented to her primary care physician with nearly 30 pound weight loss over 3-4 weeks with increasing fatigue, loss of appetite and lethargy. She had initial labs to workup the symptoms which showed a direct hyperbilirubinemia with a total bilirubin of 4.2 with a direct of 2.7 and an elevated alkaline phosphatase of 1152, AST 231, ALT 182. Sedimentation rate was elevated at 114. TSH within normal limits. This led to a CT of the abdomen on 06/20/2015 without IV contrast which showed an approximately 3 cm mass involving the pancreatic head accounting for marked dilatation of the CBD and common hepatic duct. No acute abnormalities otherwise noted.   Patient was subsequently referred to Dr. Fuller Plan from gastroenterology and had an ERCP on 06/22/2015.a malignant-appearing stricture was noted in the mid CBD and an uncovered metal stent was placed in the CBD. Brush cytology was obtained during the ERCP that is consistent with adenocarcinoma.  She was Medical oncology for further evaluation and management. Patient notes no acute abdominal pain but  has feelings of bloating and some constipation. She notes her appetite. She is accompanied by multiple family members to help her make treatment decisions. She does live on her own and has been independent thus far. We discussed the pathology results on the  findings on the CT scan. We discussed completing her staging with a PET/CT scan. She notes that she is uncertain whether she would want to have major surgery if that was recommended. She is willing to get a sense of where everything stands. Her family is very supportive. She notes no acute overt evidence of GI bleeding. Stools which were light-colored before his ERCP have normalized. Her bilirubin level is improving along with alkaline phosphatase.  Patient has a history of pancreatitis in 2012. Has been a lifelong nonsmoker. No alcohol use. No drugs.  MEDICAL HISTORY:  Past Medical History  Diagnosis Date  . Depression   . GERD (gastroesophageal reflux disease)   . Hyperlipidemia   . Hypertension   . Osteopenia   . Renal insufficiency 2007    creat 1.3  . Hiatal hernia   . LBP (low back pain)   . UNSPECIFIED PERIPHERAL VASCULAR DISEASE 06/08/2010  . ANEMIA-UNSPECIFIED 07/03/2009  . HYPERGLYCEMIA 02/26/2007  . GERD 02/26/2007  History of pancreatitis in 2012 History of colon polyps 8-9 years ago Mammogram April 2016 was negative  SURGICAL HISTORY: Past Surgical History  Procedure Laterality Date  . Appendectomy  1962  . Cholecystectomy    . Oophorectomy  1962  . Parathyroidectomy    . Dilation and curettage of uterus  1962  . Wrist fracture surgery  10/01  . Tail bone surgery    . Esophagogastroduodenoscopy      with dilatation Dr Fuller Plan  . Tonsillectomy and adenoidectomy    . Ingrown toenails       x3  . Ercp N/A 06/22/2015    Procedure: ENDOSCOPIC RETROGRADE CHOLANGIOPANCREATOGRAPHY (ERCP);  Surgeon: Ladene Artist, MD;  Location: [facility];  Service: Endoscopy;  Laterality: N/A;    SOCIAL HISTORY: Social History   Social History  . Marital Status: Widowed    Spouse Name: N/A  . Number of Children: 0  . Years of Education: N/A   Occupational History  . Retired    Social History Main Topics  . Smoking status: Never Smoker   . Smokeless tobacco: Not on file    . Alcohol Use: No  . Drug Use: No  . Sexual Activity: Not on file   Other Topics Concern  . Not on file   Social History Narrative   Physician roster:      Nephrologist-Dr. Lyda Kalata   Ophthalmologists-Dr. Norman specialist-Dr. Dara Lords - husband passed 21 years ago.  No children   Lives in small senior apartment. Small step w/handrail to enter home   Ambulates in home and performs all ADL's-walker as needed   No longer drives: brother, Abe People usually drives her   Enjoys reading Bible and watching christian television    FAMILY HISTORY: Family History  Problem Relation Age of Onset  . Kidney disease Father     failure  . Colon cancer Sister     ALLERGIES:  is allergic to diltiazem hcl.  MEDICATIONS:  Current Outpatient Prescriptions  Medication Sig Dispense Refill  . atorvastatin (LIPITOR) 40 MG tablet Take 1 tablet (40 mg total) by mouth daily. 90 tablet 1  . calcium carbonate (TUMS - DOSED IN MG ELEMENTAL CALCIUM) 500 MG chewable tablet Chew 1 tablet by mouth daily as needed for heartburn.    . citalopram (CELEXA) 20 MG tablet Take 1 tablet (20 mg total) by mouth daily. 90 tablet 1  . citalopram (CELEXA) 20 MG tablet TAKE 1 TABLET BY MOUTH DAILY. 90 tablet 1  . fluticasone (FLONASE) 50 MCG/ACT nasal spray Place 2 sprays into both nostrils daily. 16 g 1  . furosemide (LASIX) 20 MG tablet Take 1 tablet (20 mg total) by mouth daily. 90 tablet 1  . ibuprofen (ADVIL,MOTRIN) 800 MG tablet Take 1 tablet (800 mg total) by mouth 3 (three) times daily. 21 tablet 0  . labetalol (NORMODYNE) 100 MG tablet TAKE 1 TABLET BY MOUTH DAILY. 90 tablet 2  . LORazepam (ATIVAN) 0.5 MG tablet TAKE 1 TABLET BY MOUTH DAILY AS NEEDED FOR ANXIETY (Patient taking differently: TAKE 1 TABLET BY MOUTH AT BEDTIME) 30 tablet 3  . losartan (COZAAR) 25 MG tablet TAKE 1 TABLET BY MOUTH DAILY. 30 tablet 11  . meclizine (ANTIVERT) 12.5 MG tablet TAKE 1 TABLET BY MOUTH AS  NEEDED. 30 tablet 5  . Multiple Vitamin (MULTIVITAMIN) tablet Take 1 tablet by mouth daily.      Marland Kitchen omeprazole (PRILOSEC) 40 MG capsule TAKE 1 CAPSULE BY MOUTH DAILY. 90 capsule 1  . oxybutynin (DITROPAN XL) 15 MG 24 hr tablet Take 15 mg by mouth at bedtime.    . polyethylene glycol powder (GLYCOLAX/MIRALAX) powder Take 17 g by mouth 2 (two) times daily as needed. 3350 g 3   No current facility-administered medications for this visit.    REVIEW OF SYSTEMS:    10 Point review of Systems was done is negative except as noted above.  PHYSICAL EXAMINATION: ECOG PERFORMANCE STATUS: 2-3  . Filed Vitals:   07/04/15 1359  BP: 122/50  Pulse: 77  Temp: 98.3 F (36.8 C)  Resp: 18   Filed Weights   07/04/15 1359  Weight: 192 lb 3.2 oz (87.181 kg)   .  Body mass index is 37.24 kg/(m^2).  GENERAL:elderly somewhat fatigued appearing lady, alert, in no acute distress and comfortable SKIN: skin color, texture, turgor are normal, no rashes or significant lesions EYES: normal, conjunctiva are pink and non-injected, sclera clear OROPHARYNX:no exudate, no erythema and lips, buccal mucosa, and tongue normal  NECK: supple, no JVD, thyroid normal size, non-tender, without nodularity LYMPH:  no palpable lymphadenopathy in the cervical, axillary or inguinal LUNGS: clear to auscultation with normal respiratory effort HEART: regular rate & rhythm,  no murmurs and no lower extremity edema ABDOMEN: abdomen soft, non-tender, normoactive bowel sounds  Musculoskeletal: no cyanosis of digits and no clubbing  PSYCH: alert & oriented x 3 with fluent speech NEURO: no focal motor/sensory deficits  LABORATORY DATA:  I have reviewed the data as listed  . CBC Latest Ref Rng 07/04/2015 06/19/2015  WBC 3.9 - 10.3 10e3/uL 6.3 7.1  Hemoglobin 11.6 - 15.9 g/dL 10.9(L) 11.1(L)  Hematocrit 34.8 - 46.6 % 32.9(L) 33.6(L)  Platelets 145 - 400 10e3/uL 176 279.0    Deliberately limitedCMP Latest Ref Rng 07/04/2015  06/19/2015  Glucose 70 - 140 mg/dl 176(H) 183(H)  BUN 7.0 - 26.0 mg/dL 42.0(H) 41(H)  Creatinine 0.6 - 1.1 mg/dL 1.8(H) 2.13(H)  Sodium 136 - 145 mEq/L 139 141  Potassium 3.5 - 5.1 mEq/L 5.0 5.0  Chloride 96 - 112 mEq/L - 98  CO2 22 - 29 mEq/L 27 29  Calcium 8.4 - 10.4 mg/dL 9.9 9.8  Total Protein 6.4 - 8.3 g/dL 7.4 7.0  Total Bilirubin 0.20 - 1.20 mg/dL 1.36(H) 4.2(H)  Alkaline Phos 40 - 150 U/L 399(H) 1152(H)  AST 5 - 34 U/L 20 231(H)  ALT 0 - 55 U/L 25 182(H)     RADIOGRAPHIC STUDIES: I have personally reviewed the radiological images as listed and agreed with the findings in the report. Ct Abdomen Wo Contrast  06/20/2015  CLINICAL DATA:  81 year old with 2 week history of lethargy, anorexia and jaundice. Elevated liver function tests on laboratory analysis. Progressively worsening chronic renal insufficiency (creatinine 2.13, estimated GFR 24) precluded IV contrast. EXAM: CT ABDOMEN WITHOUT CONTRAST TECHNIQUE: Multidetector CT imaging of the abdomen was performed following the standard protocol without IV contrast. Oral contrast was administered. COMPARISON:  CT abdomen and pelvis 04/09/2011. FINDINGS: Lower chest: Heart moderately enlarged. Mitral annular calcification. Visualized lung bases clear. Hepatobiliary: Gallbladder surgically absent. Interval development of marked dilation of the common bile duct and common hepatic duct, though the duct is of normal caliber distally. Mild to moderate central intrahepatic ductal dilation. No focal hepatic parenchymal abnormality, allowing for the unenhanced technique. Pancreas: Atrophic body and tail. Likely mass involving the head of the pancreas, accounting for the biliary ductal dilation, measuring approximately 2.2 x 2.8 x 3.1 cm, not present on the prior examination. Spleen:  Normal unenhanced appearance. Adrenal/Urinary Tract: Normal adrenal glands. Exophytic simple cyst arising from the posterior upper pole right kidney measuring  approximately 2.2 cm, increased in size since 2012. No significant abnormality involving either kidney allowing for the unenhanced technique. No opaque upper urinary tract calculi. Gastrointestinal: Stomach normal in appearance for degree of distention. Mild wall thickening involving the visualized distal esophagus and oral contrast material within the visualized distal esophagus. Visualized small bowel normal in appearance. Large stool burden throughout the visualized colon without morphologic abnormality. No ascites. Lymphatics/Vascular: No pathologic lymphadenopathy. Aortoiliac atherosclerosis without aneurysm. Other: Umbilical hernia containing fat, the defect measuring approximately 3 cm. Musculoskeletal: Osseous demineralization. Degenerative disc disease, spondylosis and DISH involving the  lower thoracic spine. Facet degenerative changes involving the visualized lumbar spine. IMPRESSION: 1. Approximate 3 cm mass suspected involving the pancreatic head, accounting for marked dilation of the common bile duct and common hepatic duct. MRI abdomen/MRCP may be helpful in further evaluation to confirm or deny this finding. 2. No acute abnormalities otherwise, allowing for the unenhanced technique. 3. Large stool burden involving the visualized colon. 4. Oral contrast material in the visualized distal esophagus with with associated wall thickening, query GE reflux disease. Electronically Signed   By: Evangeline Dakin M.D.   On: 06/20/2015 14:54   Dg Ercp  06/22/2015  CLINICAL DATA:  Pancreatic mass EXAM: ERCP TECHNIQUE: Multiple spot images obtained with the fluoroscopic device and submitted for interpretation post-procedure. FLUOROSCOPY TIME:  Radiation Exposure Index (as provided by the fluoroscopic device): If the device does not provide the exposure index: Fluoroscopy Time:  2 minutes and 4 seconds Number of Acquired Images:  8 COMPARISON:  None. FINDINGS: Images demonstrate metal stent placement across the  pancreatic mass within the common bile duct. IMPRESSION: See above. These images were submitted for radiologic interpretation only. Please see the procedural report for the amount of contrast and the fluoroscopy time utilized. Electronically Signed   By: Marybelle Killings M.D.   On: 06/22/2015 08:58    ASSESSMENT & PLAN:   81 year old elderly lady with multiple medical comorbidities including hypertension, dyslipidemia, chronic kidney disease with  #1 Pancreatico-Biliary Adenocarcinoma. Based on radiology this appears to be primarily a pancreatic head mass however the pathology was obtained from bili duct brushings likely from invasive pancreatic cancer. Patient has had rapid weight loss with fatigue and anorexia. Has lost about 30 pounds in the last 6 weeks or so. No abdominal pain. Some concern for exocrine pancreatic insufficiency.  #2 abnormal liver function tests due to CBD obstruction from pancreatic head mass and noted biliary duct stricture. Status post stenting with improvement in liver function tests. No fevers or chills or abdominal pain to palpation to suggest cholangitis.  Plan -We'll get a PET CT scan to complete the staging of the patient's newly diagnosed pancreatic cancer. -CA-19-9 levels. -If no evidence of metastases and the patient has potentially resectable disease we will refer the patient to hepatobiliary surgery further opinion though given the patient's overall health she might not be the best surgical candidate. She herself is disinclined to consider surgery but is willing to discuss things with the surgeon if appropriate. -Given prescription for Creon to treat exocrine pancreatic insufficiency. -Senna S to maintain bowel movements. -Avoid hepatotoxins -Further management based on staging workup with PET/CT scan. -ongoing goals of care discussion.  #3 severe protein calorie malnutrition -Given low-dose dexamethasone as an appetite stimulant. -encourage patient to  improve oral intake  #4. Patient Active Problem List   Diagnosis Date Noted  . Pancreatic cancer ([place]) 07/18/2015  . Dehydration 07/18/2015  . Common biliary duct obstruction 06/22/2015  . Abnormal CT scan, pancreas or bile duct 06/22/2015  . Obstructive jaundice 06/22/2015  . Elevated LFTs 06/22/2015  . Dysphagia 10/10/2014  . Headache 10/10/2014  . Counseling regarding end of life decision making 10/10/2014  . Preventative health care 07/11/2014  . Dizziness and giddiness 07/11/2014  . Anemia 01/25/2014  . Urinary incontinence 04/08/2012  . Back pain 04/01/2012  . Restless legs syndrome (RLS) 07/08/2011  . UNSPECIFIED PERIPHERAL VASCULAR DISEASE 06/08/2010  . OSTEOARTHRITIS, KNEE, RIGHT 12/28/2008  . OBESITY 04/14/2008  . Hyperlipidemia 02/26/2007  . DEPRESSION 02/26/2007  . Essential hypertension 02/26/2007  .  GERD 02/26/2007  . Chronic renal insufficiency 02/26/2007  . OSTEOPENIA 02/26/2007  . Prediabetes 02/26/2007   Continue follow-up with primary care physician for management of other chronic medical issues.  Return to care with Dr. Irene Limbo in 2 weeks with repeat CBC, CMP and PET CT scan  All of the patients And her family's questions were answered in details to their apparent satisfaction. The patient knows to call the clinic with any problems, questions or concerns.  I spent 55 minutes counseling the patient face to face. The total time spent in the appointment was 65 minutes and more than 50% was on counseling and direct patient cares.    Sullivan Lone MD [place] AAHIVMS [facility] Hematology/Oncology Physician [facility]  (Office):       [phone] (Work cell):  [phone] (Fax):           [phone]  07/04/2015 2:17 PM

## 2015-07-27 NOTE — Discharge Instructions (Signed)

## 2015-07-27 NOTE — Op Note (Signed)
[facility] [place], [id number]   ENDOSCOPIC ULTRASOUND PROCEDURE REPORT  PATIENT: Linda, Bentley  MR#: [id number] BIRTHDATE: 09/12/1928  GENDER: female ENDOSCOPIST: [name], MD REFERRED BY:  Stark Klein, M.D. PROCEDURE DATE:  07/27/2015 PROCEDURE:   Upper EUS ASA CLASS:      Class II INDICATIONS:   1.  pancreatic adenocarcinoma, s/p recent ERCP brushing/stenting Dr.  Fuller Plan; here for staging EUS. MEDICATIONS: Monitored anesthesia care  DESCRIPTION OF PROCEDURE:   After the risks benefits and alternatives of the procedure were  explained, informed consent was obtained. The patient was then placed in the left, lateral, decubitus postion and IV sedation was administered. Throughout the procedure, the patients blood pressure, pulse and oxygen saturations were monitored continuously.  Under direct visualization, the Pentax Radial EUS [id number]  endoscope was introduced through the mouth  and advanced to the second portion of the duodenum .  Water was used as necessary to provide an acoustic interface.  Upon completion of the imaging, water was removed and the patient was sent to the recovery room in satisfactory condition.  Endoscopic findings (with radial echoendoscope): 1.  Previously placed metal biliary stent was noted extending into the duodenum across the major papilla 2. Otherwise normal UGI tract  EUS findings: 1. Hypoerchoic, heterogeneous 2.5cm mass in the head of pancreas. This mass appears to directly abut the SMV/PV confluence (uT2). 2. Single, 1cm round suspicious lymhnode near the pancreatic head (uN1) 3. The main pancreatic duct is dilated (up to 37mm in body, tail). 4. Previously placed metal biliary stent in good position within the CBD.  ENDOSCOPIC IMPRESSION: Clinical uT2N1 (Stage IIb) pancreatic adenocarcinoma that clearly abuts the SMV/PV confluence (suggesting possible invasion)  RECOMMENDATIONS: I will forward  these results to Dr. Irene Limbo, Dr. Barry Dienes.  Linda Bentley is not interested in surgery for this, prefers chemotherapy +/- radiation options.  _______________________________ eSignedMilus Banister, MD 07/27/2015 8:49 AM   CC: Drs. Rockne Menghini

## 2015-07-27 NOTE — Transfer of Care (Signed)
Immediate Anesthesia Transfer of Care Note  Patient: Linda Bentley  Procedure(s) Performed: Procedure(s): UPPER ENDOSCOPIC ULTRASOUND (EUS) RADIAL (N/A)  Patient Location: PACU and Endoscopy Unit  Anesthesia Type:MAC  Level of Consciousness: awake, alert  and oriented  Airway & Oxygen Therapy: Patient Spontanous Breathing and Patient connected to nasal cannula oxygen  Post-op Assessment: Report given to RN and Post -op Vital signs reviewed and stable  Post vital signs: Reviewed and stable  Last Vitals:  Filed Vitals:   07/27/15 0725  BP: 167/64  Pulse: 73  Temp: 36.8 C  Resp: 14    Complications: No apparent anesthesia complications

## 2015-07-27 NOTE — Anesthesia Preprocedure Evaluation (Addendum)
Anesthesia Evaluation  Patient identified by MRN, date of birth, ID band Patient awake    Reviewed: Allergy & Precautions, NPO status , Patient's Chart, lab work & pertinent test results, reviewed documented beta blocker date and time   Airway Mallampati: II  TM Distance: >3 FB Neck ROM: Full    Dental  (+) Dental Advisory Given, Partial Upper, Missing   Pulmonary neg pulmonary ROS,    Pulmonary exam normal breath sounds clear to auscultation       Cardiovascular hypertension, Pt. on medications and Pt. on home beta blockers + Peripheral Vascular Disease  Normal cardiovascular exam Rhythm:Regular Rate:Normal     Neuro/Psych  Headaches, PSYCHIATRIC DISORDERS Depression    GI/Hepatic negative GI ROS, Neg liver ROS, GERD  Medicated,Pancreatic cancer   Endo/Other  negative endocrine ROS  Renal/GU Renal InsufficiencyRenal disease     Musculoskeletal  (+) Arthritis , Osteoarthritis,    Abdominal   Peds  Hematology  (+) Blood dyscrasia, anemia ,   Anesthesia Other Findings Day of surgery medications reviewed with the patient.  Reproductive/Obstetrics                            Anesthesia Physical Anesthesia Plan  ASA: III  Anesthesia Plan: MAC   Post-op Pain Management:    Induction: Intravenous  Airway Management Planned: Nasal Cannula  Additional Equipment:   Intra-op Plan:   Post-operative Plan:   Informed Consent: I have reviewed the patients History and Physical, chart, labs and discussed the procedure including the risks, benefits and alternatives for the proposed anesthesia with the patient or authorized representative who has indicated his/her understanding and acceptance.   Dental advisory given  Plan Discussed with: CRNA and Anesthesiologist  Anesthesia Plan Comments: (Discussed risks/benefits/alternatives to MAC sedation including need for ventilatory support,  hypotension, need for conversion to general anesthesia.  All patient questions answered.  Patient wished to proceed.)        Anesthesia Quick Evaluation

## 2015-07-27 NOTE — Anesthesia Postprocedure Evaluation (Signed)
Anesthesia Post Note  Patient: Linda Bentley  Procedure(s) Performed: Procedure(s) (LRB): UPPER ENDOSCOPIC ULTRASOUND (EUS) RADIAL (N/A)  Patient location during evaluation: PACU Anesthesia Type: MAC Level of consciousness: awake and alert, awake and oriented Pain management: pain level controlled Vital Signs Assessment: post-procedure vital signs reviewed and stable Respiratory status: spontaneous breathing, nonlabored ventilation, respiratory function stable and patient connected to nasal cannula oxygen Cardiovascular status: blood pressure returned to baseline and stable Postop Assessment: no signs of nausea or vomiting Anesthetic complications: no    Last Vitals:  Filed Vitals:   07/27/15 0900 07/27/15 0910  BP: 129/54 172/79  Pulse: 70 70  Temp:    Resp: 15 16    Last Pain: There were no vitals filed for this visit.               Catalina Gravel

## 2015-07-28 ENCOUNTER — Telehealth: Payer: Self-pay | Admitting: *Deleted

## 2015-07-28 ENCOUNTER — Encounter (HOSPITAL_COMMUNITY): Payer: Self-pay | Admitting: Gastroenterology

## 2015-07-28 ENCOUNTER — Inpatient Hospital Stay (HOSPITAL_COMMUNITY)
Admission: EM | Admit: 2015-07-28 | Discharge: 2015-08-01 | DRG: 640 | Disposition: A | Discharge status: Skilled Nursing Facility | Payer: Medicare Other | Attending: Internal Medicine | Admitting: Internal Medicine

## 2015-07-28 ENCOUNTER — Telehealth: Payer: Self-pay | Admitting: Internal Medicine

## 2015-07-28 DIAGNOSIS — E1101 Type 2 diabetes mellitus with hyperosmolarity with coma: Secondary | ICD-10-CM | POA: Diagnosis not present

## 2015-07-28 DIAGNOSIS — E870 Hyperosmolality and hypernatremia: Principal | ICD-10-CM | POA: Diagnosis present

## 2015-07-28 DIAGNOSIS — N183 Chronic kidney disease, stage 3 unspecified: Secondary | ICD-10-CM | POA: Diagnosis present

## 2015-07-28 DIAGNOSIS — R5383 Other fatigue: Secondary | ICD-10-CM | POA: Diagnosis not present

## 2015-07-28 DIAGNOSIS — R739 Hyperglycemia, unspecified: Secondary | ICD-10-CM | POA: Diagnosis not present

## 2015-07-28 DIAGNOSIS — E1310 Other specified diabetes mellitus with ketoacidosis without coma: Secondary | ICD-10-CM | POA: Diagnosis present

## 2015-07-28 DIAGNOSIS — Z888 Allergy status to other drugs, medicaments and biological substances status: Secondary | ICD-10-CM

## 2015-07-28 DIAGNOSIS — E0810 Diabetes mellitus due to underlying condition with ketoacidosis without coma: Secondary | ICD-10-CM | POA: Diagnosis not present

## 2015-07-28 DIAGNOSIS — R350 Frequency of micturition: Secondary | ICD-10-CM | POA: Diagnosis not present

## 2015-07-28 DIAGNOSIS — C259 Malignant neoplasm of pancreas, unspecified: Secondary | ICD-10-CM | POA: Diagnosis not present

## 2015-07-28 DIAGNOSIS — Z66 Do not resuscitate: Secondary | ICD-10-CM | POA: Diagnosis present

## 2015-07-28 DIAGNOSIS — I739 Peripheral vascular disease, unspecified: Secondary | ICD-10-CM | POA: Diagnosis not present

## 2015-07-28 DIAGNOSIS — E872 Acidosis, unspecified: Secondary | ICD-10-CM | POA: Insufficient documentation

## 2015-07-28 DIAGNOSIS — N289 Disorder of kidney and ureter, unspecified: Secondary | ICD-10-CM | POA: Diagnosis not present

## 2015-07-28 DIAGNOSIS — C250 Malignant neoplasm of head of pancreas: Secondary | ICD-10-CM

## 2015-07-28 DIAGNOSIS — E0800 Diabetes mellitus due to underlying condition with hyperosmolarity without nonketotic hyperglycemic-hyperosmolar coma (NKHHC): Secondary | ICD-10-CM | POA: Diagnosis not present

## 2015-07-28 DIAGNOSIS — E1300 Other specified diabetes mellitus with hyperosmolarity without nonketotic hyperglycemic-hyperosmolar coma (NKHHC): Secondary | ICD-10-CM | POA: Diagnosis not present

## 2015-07-28 DIAGNOSIS — Z800 Family history of malignant neoplasm of digestive organs: Secondary | ICD-10-CM | POA: Diagnosis not present

## 2015-07-28 DIAGNOSIS — N184 Chronic kidney disease, stage 4 (severe): Secondary | ICD-10-CM | POA: Diagnosis not present

## 2015-07-28 DIAGNOSIS — M8580 Other specified disorders of bone density and structure, unspecified site: Secondary | ICD-10-CM | POA: Diagnosis present

## 2015-07-28 DIAGNOSIS — Z7952 Long term (current) use of systemic steroids: Secondary | ICD-10-CM

## 2015-07-28 DIAGNOSIS — K5900 Constipation, unspecified: Secondary | ICD-10-CM | POA: Diagnosis present

## 2015-07-28 DIAGNOSIS — I10 Essential (primary) hypertension: Secondary | ICD-10-CM | POA: Diagnosis not present

## 2015-07-28 DIAGNOSIS — E860 Dehydration: Secondary | ICD-10-CM | POA: Diagnosis present

## 2015-07-28 DIAGNOSIS — M545 Low back pain: Secondary | ICD-10-CM | POA: Diagnosis not present

## 2015-07-28 DIAGNOSIS — Z79899 Other long term (current) drug therapy: Secondary | ICD-10-CM | POA: Diagnosis not present

## 2015-07-28 DIAGNOSIS — E785 Hyperlipidemia, unspecified: Secondary | ICD-10-CM | POA: Diagnosis not present

## 2015-07-28 DIAGNOSIS — D649 Anemia, unspecified: Secondary | ICD-10-CM | POA: Diagnosis not present

## 2015-07-28 DIAGNOSIS — F329 Major depressive disorder, single episode, unspecified: Secondary | ICD-10-CM | POA: Diagnosis not present

## 2015-07-28 DIAGNOSIS — I129 Hypertensive chronic kidney disease with stage 1 through stage 4 chronic kidney disease, or unspecified chronic kidney disease: Secondary | ICD-10-CM | POA: Diagnosis present

## 2015-07-28 DIAGNOSIS — Z841 Family history of disorders of kidney and ureter: Secondary | ICD-10-CM

## 2015-07-28 DIAGNOSIS — E876 Hypokalemia: Secondary | ICD-10-CM | POA: Diagnosis present

## 2015-07-28 DIAGNOSIS — K219 Gastro-esophageal reflux disease without esophagitis: Secondary | ICD-10-CM | POA: Diagnosis present

## 2015-07-28 DIAGNOSIS — E088 Diabetes mellitus due to underlying condition with unspecified complications: Secondary | ICD-10-CM | POA: Diagnosis not present

## 2015-07-28 DIAGNOSIS — E1169 Type 2 diabetes mellitus with other specified complication: Secondary | ICD-10-CM | POA: Diagnosis not present

## 2015-07-28 LAB — BASIC METABOLIC PANEL
ANION GAP: 16 — AB (ref 5–15)
Anion gap: 17 — ABNORMAL HIGH (ref 5–15)
BUN: 52 mg/dL — AB (ref 6–20)
BUN: 50 mg/dL — ABNORMAL HIGH (ref 6–20)
CALCIUM: 10 mg/dL (ref 8.9–10.3)
CHLORIDE: 96 mmol/L — AB (ref 101–111)
CO2: 21 mmol/L — ABNORMAL LOW (ref 22–32)
CO2: 23 mmol/L (ref 22–32)
CREATININE: 1.85 mg/dL — AB (ref 0.44–1.00)
Calcium: 9.8 mg/dL (ref 8.9–10.3)
Chloride: 98 mmol/L — ABNORMAL LOW (ref 101–111)
Creatinine, Ser: 1.82 mg/dL — ABNORMAL HIGH (ref 0.44–1.00)
GFR, EST AFRICAN AMERICAN: 28 mL/min — AB (ref >60)
GFR, EST AFRICAN AMERICAN: 28 mL/min — AB (ref >60)
GFR, EST NON AFRICAN AMERICAN: 24 mL/min — AB (ref >60)
GFR, EST NON AFRICAN AMERICAN: 24 mL/min — AB (ref >60)
GLUCOSE: 751 mg/dL — AB (ref 65–99)
Glucose, Bld: 850 mg/dL (ref 65–99)
POTASSIUM: 5.1 mmol/L (ref 3.5–5.1)
POTASSIUM: 5.2 mmol/L — AB (ref 3.5–5.1)
SODIUM: 134 mmol/L — AB (ref 135–145)
Sodium: 137 mmol/L (ref 135–145)

## 2015-07-28 LAB — URINALYSIS, ROUTINE W REFLEX MICROSCOPIC
Bilirubin Urine: NEGATIVE
Glucose, UA: >1000 mg/dL — AB
KETONES UR: 15 mg/dL — AB
NITRITE: NEGATIVE
PROTEIN: NEGATIVE mg/dL
Specific Gravity, Urine: 1.031 — ABNORMAL HIGH (ref 1.005–1.030)
pH: 5.5 (ref 5.0–8.0)

## 2015-07-28 LAB — CBC
HCT: 36.8 % (ref 36.0–46.0)
Hemoglobin: 12.3 g/dL (ref 12.0–15.0)
MCH: 29.9 pg (ref 26.0–34.0)
MCHC: 33.4 g/dL (ref 30.0–36.0)
MCV: 89.5 fL (ref 78.0–100.0)
PLATELETS: 162 10*3/uL (ref 150–400)
RBC: 4.11 MIL/uL (ref 3.87–5.11)
RDW: 12.2 % (ref 11.5–15.5)
WBC: 7.9 10*3/uL (ref 4.0–10.5)

## 2015-07-28 LAB — TROPONIN I: TROPONIN I: 0.05 ng/mL — AB (ref <0.031)

## 2015-07-28 LAB — BLOOD GAS, VENOUS
ACID-BASE DEFICIT: 3.4 mmol/L — AB (ref 0.0–2.0)
BICARBONATE: 19.1 meq/L — AB (ref 20.0–24.0)
DRAWN BY: 295031
FIO2: 0.21
O2 SAT: 94.9 %
PCO2 VEN: 28 mmHg — AB (ref 45.0–50.0)
PH VEN: 7.447 — AB (ref 7.250–7.300)
PO2 VEN: 71.8 mmHg — AB (ref 30.0–45.0)
Patient temperature: 98.6
TCO2: 16.9 mmol/L (ref 0–100)

## 2015-07-28 LAB — URINE MICROSCOPIC-ADD ON: BACTERIA UA: NONE SEEN

## 2015-07-28 LAB — MAGNESIUM: MAGNESIUM: 2.5 mg/dL — AB (ref 1.7–2.4)

## 2015-07-28 LAB — CBG MONITORING, ED: Glucose-Capillary: >600 mg/dL (ref 65–99)

## 2015-07-28 LAB — GLUCOSE, CAPILLARY
GLUCOSE-CAPILLARY: 462 mg/dL — AB (ref 65–99)
Glucose-Capillary: 373 mg/dL — ABNORMAL HIGH (ref 65–99)

## 2015-07-28 LAB — MRSA PCR SCREENING: MRSA by PCR: NEGATIVE

## 2015-07-28 LAB — PHOSPHORUS: PHOSPHORUS: 3.9 mg/dL (ref 2.5–4.6)

## 2015-07-28 MED ORDER — SODIUM CHLORIDE 0.9 % IV SOLN
INTRAVENOUS | Status: DC
Start: 2015-07-28 — End: 2015-07-29
  Administered 2015-07-28: 21:00:00 via INTRAVENOUS
  Filled 2015-07-28: qty 2.5

## 2015-07-28 MED ORDER — PANTOPRAZOLE SODIUM 40 MG PO TBEC
80.0000 mg | DELAYED_RELEASE_TABLET | Freq: Every day | ORAL | Status: DC
Start: 2015-07-28 — End: 2015-08-01
  Administered 2015-07-28 – 2015-08-01 (×5): 80 mg via ORAL
  Filled 2015-07-28 (×6): qty 2

## 2015-07-28 MED ORDER — SODIUM CHLORIDE 0.9 % IV SOLN
INTRAVENOUS | Status: DC
  Filled 2015-07-28: qty 2.5

## 2015-07-28 MED ORDER — SENNOSIDES-DOCUSATE SODIUM 8.6-50 MG PO TABS
2.0000 | ORAL_TABLET | Freq: Every day | ORAL | Status: DC
  Administered 2015-07-28 – 2015-07-31 (×4): 2 via ORAL
  Filled 2015-07-28 (×4): qty 2

## 2015-07-28 MED ORDER — LORAZEPAM 0.5 MG PO TABS
0.5000 mg | ORAL_TABLET | Freq: Every day | ORAL | Status: DC
  Administered 2015-07-28 – 2015-07-31 (×4): 0.5 mg via ORAL
  Filled 2015-07-28 (×4): qty 1

## 2015-07-28 MED ORDER — DEXTROSE-NACL 5-0.45 % IV SOLN: INTRAVENOUS | Status: DC

## 2015-07-28 MED ORDER — POLYETHYLENE GLYCOL 3350 17 GM/SCOOP PO POWD
17.0000 g | Freq: Every day | ORAL | Status: DC | PRN
  Filled 2015-07-28: qty 255

## 2015-07-28 MED ORDER — SODIUM CHLORIDE 0.9 % IV BOLUS (SEPSIS)
1000.0000 mL | Freq: Once | INTRAVENOUS | Status: AC
  Administered 2015-07-28: 1000 mL via INTRAVENOUS

## 2015-07-28 MED ORDER — CALCIUM CARBONATE ANTACID 500 MG PO CHEW
1.0000 | CHEWABLE_TABLET | Freq: Every day | ORAL | Status: DC | PRN
  Filled 2015-07-28: qty 2

## 2015-07-28 MED ORDER — SODIUM CHLORIDE 0.9 % IV SOLN
INTRAVENOUS | Status: DC
Start: 2015-07-28 — End: 2015-07-29
  Administered 2015-07-28: 100 mL/h via INTRAVENOUS

## 2015-07-28 MED ORDER — DEXTROSE-NACL 5-0.45 % IV SOLN
INTRAVENOUS | Status: DC
  Administered 2015-07-29: 01:00:00 via INTRAVENOUS

## 2015-07-28 MED ORDER — ENOXAPARIN SODIUM 30 MG/0.3ML ~~LOC~~ SOLN
30.0000 mg | SUBCUTANEOUS | Status: DC
  Administered 2015-07-28: 30 mg via SUBCUTANEOUS
  Filled 2015-07-28: qty 0.3

## 2015-07-28 MED ORDER — HYDRALAZINE HCL 20 MG/ML IJ SOLN: 10.0000 mg | INTRAMUSCULAR | Status: DC | PRN

## 2015-07-28 MED ORDER — CITALOPRAM HYDROBROMIDE 20 MG PO TABS
20.0000 mg | ORAL_TABLET | Freq: Every day | ORAL | Status: DC
  Administered 2015-07-28 – 2015-08-01 (×5): 20 mg via ORAL
  Filled 2015-07-28 (×5): qty 1

## 2015-07-28 MED ORDER — PANCRELIPASE (LIP-PROT-AMYL) 12000-38000 UNITS PO CPEP
12000.0000 [IU] | ORAL_CAPSULE | Freq: Three times a day (TID) | ORAL | Status: DC
  Administered 2015-07-29 – 2015-08-01 (×11): 12000 [IU] via ORAL
  Filled 2015-07-28 (×15): qty 1

## 2015-07-28 NOTE — ED Notes (Signed)
NS bolus restarted to new site.  Pt informed if site begins to hurt, please call immediately.  Pt verbalized understanding.  Pt & family informed of Rm # [id number].

## 2015-07-28 NOTE — H&P (Signed)
[facility] History and Physical  LORENNA LURRY WUJ:[id number] DOB: May 13, 1929 DOA: 07/28/2015  Referring physician: ED physician PCP: Drema Pry, DO  Specialists: Dr. Irene Limbo (oncology), Dr. Moshe Cipro (nephrology)   Chief Complaint: Lucky Rathke, polyuria, polydipsia   HPI: MELESA Bentley is a very pleasant, but unfortunate 81 y.o. female with PMH of hypertension, hyperlipidemia, chronic kidney disease stage III-IV, and recent diagnosis of pancreatic adenocarcinoma who presents with several weeks of progressive generalized weakness, polyuria, and polydipsia. Blood glucose noted to be 345 while at [facility] 10 days ago, but previously not this high. Hemoglobin A1c was 5.9% on 03/13/2015. Patient has been on Decadron. She denies recent fevers or chills, and denies abdominal pain, diarrhea, or dysuria. She is currently under the treatment of Dr. Irene Limbo of oncology, and per their notes has declined surgery, but wishes to proceed with chemotherapy.   In ED, patient was found to be afebrile, saturating well on room air, and with vital signs stable. Initial blood work revealed serum glucose of 850 with bicarbonate of 21 and anion gap of 17. Urine ketones present. Patient was bolused 1 L normal saline, and a second liter was ordered, the IV infiltrated and access has not yet been achieved. Mrs. [name] is remained hemodynamically stable in the emergency department and the hospitalists have been asked to admit.  Where does patient live?   At home  Can patient participate in ADLs?  Yes       Review of Systems:   General: no fevers, chills, or sweats. Poor appetite and fatigue, recent unintentional wt loss HEENT: no blurry vision, hearing changes or sore throat Pulm: no dyspnea, cough, or wheeze CV: no chest pain or palpitations Abd: no nausea, vomiting, abdominal pain, diarrhea, or constipation GU: no dysuria, hematuria, increased urinary frequency, or urgency. Polyuria  Ext: no leg  edema Neuro: no focal weakness, numbness, or tingling, no vision change or hearing loss. Generalized weakness Skin: no rash, no wounds MSK: No muscle spasm, no deformity, no red, hot, or swollen joint Heme: No easy bruising or bleeding Travel history: No recent long distant travel    Allergy:  Allergies  Allergen Reactions  . Diltiazem Hcl Other (See Comments)    REACTION: feeling drunk    Past Medical History  Diagnosis Date  . Depression   . GERD (gastroesophageal reflux disease)   . Hyperlipidemia   . Hypertension   . Osteopenia   . Renal insufficiency 2007    creat 1.3  . Hiatal hernia   . LBP (low back pain)   . UNSPECIFIED PERIPHERAL VASCULAR DISEASE 06/08/2010  . ANEMIA-UNSPECIFIED 07/03/2009  . HYPERGLYCEMIA 02/26/2007  . GERD 02/26/2007    Past Surgical History  Procedure Laterality Date  . Appendectomy  1962  . Cholecystectomy    . Oophorectomy  1962  . Parathyroidectomy    . Dilation and curettage of uterus  1962  . Wrist fracture surgery  10/01  . Tail bone surgery    . Esophagogastroduodenoscopy      with dilatation Dr Fuller Plan  . Tonsillectomy and adenoidectomy    . Ingrown toenails       x3  . Ercp N/A 06/22/2015    Procedure: ENDOSCOPIC RETROGRADE CHOLANGIOPANCREATOGRAPHY (ERCP);  Surgeon: Ladene Artist, MD;  Location: [facility];  Service: Endoscopy;  Laterality: N/A;  . Eus N/A 07/27/2015    Procedure: UPPER ENDOSCOPIC ULTRASOUND (EUS) RADIAL;  Surgeon: [name], MD;  Location: [facility];  Service: Endoscopy;  Laterality: N/A;  Social History:  reports that she has never smoked. She does not have any smokeless tobacco history on file. She reports that she does not drink alcohol or use illicit drugs.  Family History:  Family History  Problem Relation Age of Onset  . Kidney disease Father     failure  . Colon cancer Sister      Prior to Admission medications   Medication Sig Start Date End Date Taking? Authorizing Provider   atorvastatin (LIPITOR) 40 MG tablet Take 1 tablet (40 mg total) by mouth daily. 11/09/14  Yes Doe-Hyun Kyra Searles, DO  calcium carbonate (TUMS - DOSED IN MG ELEMENTAL CALCIUM) 500 MG chewable tablet Chew 1-2 tablets by mouth daily as needed for indigestion or heartburn.    Yes Historical Provider, MD  citalopram (CELEXA) 20 MG tablet TAKE 1 TABLET BY MOUTH DAILY. 06/02/15  Yes Doe-Hyun R Shawna Orleans, DO  dexamethasone (DECADRON) 2 MG tablet Take 1 tablet (2 mg total) by mouth daily. 07/04/15  Yes Brunetta Genera, MD  fluticasone (FLONASE) 50 MCG/ACT nasal spray Place 2 sprays into both nostrils daily. Patient taking differently: Place 2 sprays into both nostrils daily as needed for allergies.  03/13/15 03/12/16 Yes Doe-Hyun R Shawna Orleans, DO  labetalol (NORMODYNE) 100 MG tablet TAKE 1 TABLET BY MOUTH DAILY. 12/05/14  Yes Doe-Hyun Kyra Searles, DO  lipase/protease/amylase (CREON) 12000 UNITS CPEP capsule Take 1 capsule (12,000 Units total) by mouth 3 (three) times daily before meals. 07/04/15  Yes Brunetta Genera, MD  LORazepam (ATIVAN) 0.5 MG tablet TAKE 1 TABLET BY MOUTH DAILY AS NEEDED FOR ANXIETY Patient taking differently: TAKE 1 TABLET BY MOUTH AT BEDTIME 04/05/15  Yes Doe-Hyun R Yoo, DO  omeprazole (PRILOSEC) 40 MG capsule TAKE 1 CAPSULE BY MOUTH DAILY. 06/02/15  Yes Doe-Hyun R Shawna Orleans, DO  oxybutynin (DITROPAN XL) 15 MG 24 hr tablet Take 15 mg by mouth at bedtime.   Yes Historical Provider, MD  polyethylene glycol powder (GLYCOLAX/MIRALAX) powder Take 17 g by mouth 2 (two) times daily as needed. Patient taking differently: Take 17 g by mouth daily as needed for severe constipation.  10/10/14  Yes Doe-Hyun R Shawna Orleans, DO  senna-docusate (SENNA S) 8.6-50 MG tablet Take 2 tablets by mouth at bedtime. 07/04/15  Yes Brunetta Genera, MD    Physical Exam: Filed Vitals:   07/28/15 1615 07/28/15 1630 07/28/15 1800 07/28/15 1830  BP: 147/75 149/78 142/69 142/73  Pulse: 87 75  72  Temp: 97.3 F (36.3 C)     TempSrc: Oral      Resp: 18 22 18 19   SpO2: 96% 98%  94%   General: Not in acute distress HEENT:       Eyes: PERRL, EOMI, no scleral icterus or conjunctival pallor.       ENT: No discharge from the ears or nose, no pharyngeal ulcers, petechiae or exudate, no tonsillar enlargement. Oral mucosa dry, tacky       Neck: No JVD, no bruit, no appreciable mass Heme: No cervical adenopathy, no pallor Cardiac: S1/S2, RRR, No significant murmurs, No gallops or rubs. Pulm: Good air movement bilaterally. No rales, wheezing, rhonchi or rubs. Abd: Soft, nondistended, nontender, no rebound pain or gaurding, no mass or organomegaly, BS present. Ext: No LE edema bilaterally. 2+DP/PT pulse bilaterally. Musculoskeletal: No gross deformity, no red, hot, swollen joints, no limitation in ROM  Skin: No rashes or wounds on exposed surfaces.  Poor skin turgor Neuro: Alert, oriented X3, cranial nerves II-XII grossly intact. No focal  findings Psych: Patient is not overtly psychotic.  Labs on Admission:  Basic Metabolic Panel:  Recent Labs Lab 07/28/15 1655  NA 134*  K 5.1  CL 96*  CO2 21*  GLUCOSE 850*  BUN 52*  CREATININE 1.85*  CALCIUM 9.8   Liver Function Tests: No results for input(s): AST, ALT, ALKPHOS, BILITOT, PROT, ALBUMIN in the last 168 hours. No results for input(s): LIPASE, AMYLASE in the last 168 hours. No results for input(s): AMMONIA in the last 168 hours. CBC:  Recent Labs Lab 07/28/15 1655  WBC 7.9  HGB 12.3  HCT 36.8  MCV 89.5  PLT 162   Cardiac Enzymes: No results for input(s): CKTOTAL, CKMB, CKMBINDEX, TROPONINI in the last 168 hours.  BNP (last 3 results) No results for input(s): BNP in the last 8760 hours.  ProBNP (last 3 results) No results for input(s): PROBNP in the last 8760 hours.  CBG:  Recent Labs Lab 07/28/15 1640  GLUCAP >600*    Radiological Exams on Admission: No results found.  EKG: Independently reviewed.  Abnormal findings:    Sinus rhythm, LAFB    Assessment/Plan  1. Secondary diabetes with DKA - Diabetes is new diagnosis, likely secondary to pancreatic adenocarcinoma - Serum glucose 345 ten days ago, previously not this elevated - Hemoglobin A1c 5.9% on 03/13/2015 - Serum glucose 850 on presentation, serum bicarbonate 21, anion gap 17, urine ketones present - Bolused 1 L NS in ED, second liter ordered but IV infiltrated, IV therapy consulted - Establish IV access, complete fluid bolus, initiate glucose stabilizer  - Continue NS at 125cc/hr, without K given initial potassium 5.1 - Follow CBG every hour, metabolic panel every 4 hours - When glucose less than 250, administer long-acting insulin SQ and d/c infusion 2 hours later - Carb consistent diet when appropriate - Hold patient's Decadron for now  If unable to achieve IV access in a timely fashion, will proceed with SQ approach as follows: - Insulin lispro, initial dose 0.3 units/kg, followed by 0.1 unit/kg every hour until glucose <250 - When glucose <250, use insulin lispro 0.05-0.1 unit/kg every 1-2 hours until resolution a ketoacidosis  2. Chronic kidney disease stage IV - SCr 1.85 on admit (eGFR 24), w/ apparent b/l ~1.55 - Patient very dehydrated in setting of DKA, anticipate improvement with fluid resuscitation - BMP every 4 hours according to DKA protocol - Avoiding nephrotoxins as feasible  3. Pancreatic cancer - Recent diagnosis - ERCP on 06/22/2015 with malignant appearing lesion in the pancreatic head, stent was placed and pressure biopsy taken, consistent with adenocarcinoma - PET scan suggest disease is localized to the pancreatic head - Per oncology notes, patient has declined surgical treatment, opting for chemotherapy only - Oncologist will be notified of patient's admission - Continue Creon, holding Decadron for now in setting of DKA   4.  Hypertension  - Managed at home with labetalol  - Currently hypertensive, worse after fluid bolus  - Will manage  with IV pushes of hydralazine as needed for now  - If IV access not achieved shortly, will use POs, clonidine or nitro patch prn  5. Electrolyte derangements - Secondary to DKA, treating DKA as above  - BMP q4h, replete as needed  DVT ppx: SQ Heparin   Code Status: DNR Family Communication:  Yes, patient's brother, Abe People (DPOA), and numerous other family members at bed side Disposition Plan: Admit to inpatient   Date of Service 07/28/2015    Vianne Bulls, MD [facility] Pager  [phone]  If 7PM-7AM, please contact night-coverage [email or web address] Password [facility] 07/28/2015, 6:52 PM

## 2015-07-28 NOTE — ED Notes (Signed)
Pt remains on monitor & continuous pulse ox.  IV team consult requested d/t pt stating "they had a hard time starting my IV yesterday.  They always have a hard time."  IV attempted x 1 without success to [facility].

## 2015-07-28 NOTE — ED Notes (Signed)
Called CN and let her know patient will be up up in 20 minutes.

## 2015-07-28 NOTE — ED Notes (Signed)
Pt completed visual acuity with corrective lenses

## 2015-07-28 NOTE — Telephone Encounter (Signed)
Call from [facility] PT with [place].  "Saw patient this morning T = 97.8, P = 80, R = 16, O2 sat = 98 % on room air, B/P = 162/68.  She was tired, weak stating she was up every two hours last night going to the bathroom.  Not eating but drinking a lot.  Nausea but no vomiting.    Called patient who denies pain or burning with urination.  Reports urine frequency with odor.  Linda Bentley is asking her sister in law to take her to the ED.  Advised Urgent Care for further evaluation or ED if she feels this poorly.

## 2015-07-28 NOTE — ED Notes (Signed)
Recently diagnosed with cancer. Friend states she's had moments of passing out since then. Pt states this morning patient was acting delusional and shaking. Says the patient has been saying "I'm too sick to want to live."

## 2015-07-28 NOTE — ED Notes (Signed)
Hospitalist @ [facility].

## 2015-07-28 NOTE — Telephone Encounter (Signed)
Keji from [place] called saying the pt feels sick, tired, and weak today. She was diagnosed last week as being dehydrated and was instructed to drink more fluids. She's been doing so but now she awakens every couple of hours to use the bathroom. Keji isn't sure if she's not feeling well due to not receiving enough rest last night or if it could be something else. He's also concerned due to the pt recently being diagnosed with Pancreatic Ca. He'd like a phone call regarding this.   Keji's ph# [phone] Thank you.

## 2015-07-28 NOTE — ED Notes (Signed)
Dr. Regenia Skeeter informed of IV infiltration, fluids stopped.  V.O. Received for IV Team.  See orders.

## 2015-07-28 NOTE — ED Provider Notes (Signed)
CSN: [id number]     Arrival date & time 07/28/15  1607 History   First MD Initiated Contact with Patient 07/28/15 1627     Chief Complaint  Patient presents with  . Weakness  . Tremors     (Consider location/radiation/quality/duration/timing/severity/associated sxs/prior Treatment) HPI  81 year old female presents with progressive weakness. This is been an ongoing issue for several weeks. About one month ago she was diagnosed with pancreatic cancer. She is having diffuse fatigue and lack of strength. She is very thirsty and drinking water a lot but has no appetite and no taste so she is barely eating food. She has also been urinating frequently without pain. This is been ongoing for several weeks as well. No fevers or chills. No cough or shortness of breath. Patient states this is worse than the way she felt about 10 days ago when she has to get IV fluids for hypotension and weakness. At that time she was sent home. Patient denies any focal area of pain. There is no focal weakness or numbness. Denies headaches. She has been having blurry vision bilaterally for the last several weeks as well. Patient denies a known history of diabetes.  Past Medical History  Diagnosis Date  . Depression   . GERD (gastroesophageal reflux disease)   . Hyperlipidemia   . Hypertension   . Osteopenia   . Renal insufficiency 2007    creat 1.3  . Hiatal hernia   . LBP (low back pain)   . UNSPECIFIED PERIPHERAL VASCULAR DISEASE 06/08/2010  . ANEMIA-UNSPECIFIED 07/03/2009  . HYPERGLYCEMIA 02/26/2007  . GERD 02/26/2007   Past Surgical History  Procedure Laterality Date  . Appendectomy  1962  . Cholecystectomy    . Oophorectomy  1962  . Parathyroidectomy    . Dilation and curettage of uterus  1962  . Wrist fracture surgery  10/01  . Tail bone surgery    . Esophagogastroduodenoscopy      with dilatation Dr Fuller Plan  . Tonsillectomy and adenoidectomy    . Ingrown toenails       x3  . Ercp N/A 06/22/2015   Procedure: ENDOSCOPIC RETROGRADE CHOLANGIOPANCREATOGRAPHY (ERCP);  Surgeon: Ladene Artist, MD;  Location: [facility];  Service: Endoscopy;  Laterality: N/A;  . Eus N/A 07/27/2015    Procedure: UPPER ENDOSCOPIC ULTRASOUND (EUS) RADIAL;  Surgeon: [name], MD;  Location: [facility];  Service: Endoscopy;  Laterality: N/A;   Family History  Problem Relation Age of Onset  . Kidney disease Father     failure  . Colon cancer Sister    Social History  Substance Use Topics  . Smoking status: Never Smoker   . Smokeless tobacco: None  . Alcohol Use: No   OB History    No data available     Review of Systems  Constitutional: Positive for fatigue.  Eyes: Positive for visual disturbance.  Respiratory: Negative for shortness of breath.   Cardiovascular: Negative for chest pain.  Gastrointestinal: Negative for vomiting and abdominal pain.  Endocrine: Positive for polydipsia and polyuria.  Genitourinary: Negative for dysuria.  Neurological: Positive for weakness. Negative for numbness.  All other systems reviewed and are negative.     Allergies  Diltiazem hcl  Home Medications   Prior to Admission medications   Medication Sig Start Date End Date Taking? Authorizing Provider  atorvastatin (LIPITOR) 40 MG tablet Take 1 tablet (40 mg total) by mouth daily. 11/09/14   Doe-Hyun R Shawna Orleans, DO  calcium carbonate (TUMS - DOSED IN MG  ELEMENTAL CALCIUM) 500 MG chewable tablet Chew 1-2 tablets by mouth daily as needed for heartburn.     Historical Provider, MD  citalopram (CELEXA) 20 MG tablet TAKE 1 TABLET BY MOUTH DAILY. 06/02/15   Doe-Hyun R Shawna Orleans, DO  dexamethasone (DECADRON) 2 MG tablet Take 1 tablet (2 mg total) by mouth daily. 07/04/15   Brunetta Genera, MD  fluticasone (FLONASE) 50 MCG/ACT nasal spray Place 2 sprays into both nostrils daily. Patient taking differently: Place 2 sprays into both nostrils daily as needed for allergies.  03/13/15 03/12/16  Doe-Hyun R Shawna Orleans, DO  labetalol  (NORMODYNE) 100 MG tablet TAKE 1 TABLET BY MOUTH DAILY. 12/05/14   Doe-Hyun R Shawna Orleans, DO  lipase/protease/amylase (CREON) 12000 UNITS CPEP capsule Take 1 capsule (12,000 Units total) by mouth 3 (three) times daily before meals. 07/04/15   Brunetta Genera, MD  LORazepam (ATIVAN) 0.5 MG tablet TAKE 1 TABLET BY MOUTH DAILY AS NEEDED FOR ANXIETY Patient taking differently: TAKE 1 TABLET BY MOUTH AT BEDTIME 04/05/15   Doe-Hyun Kyra Searles, DO  Multiple Vitamin (MULTIVITAMIN) tablet Take 1 tablet by mouth daily.      Historical Provider, MD  omeprazole (PRILOSEC) 40 MG capsule TAKE 1 CAPSULE BY MOUTH DAILY. 06/02/15   Doe-Hyun R Shawna Orleans, DO  oxybutynin (DITROPAN XL) 15 MG 24 hr tablet Take 15 mg by mouth at bedtime.    Historical Provider, MD  polyethylene glycol powder (GLYCOLAX/MIRALAX) powder Take 17 g by mouth 2 (two) times daily as needed. Patient taking differently: Take 17 g by mouth daily as needed for severe constipation.  10/10/14   Doe-Hyun R Shawna Orleans, DO  senna-docusate (SENNA S) 8.6-50 MG tablet Take 2 tablets by mouth at bedtime. 07/04/15   Brunetta Genera, MD   BP 147/75 mmHg  Pulse 87  Temp(Src) 97.3 F (36.3 C) (Oral)  Resp 18  SpO2 96% Physical Exam  Constitutional: She is oriented to person, place, and time. She appears well-developed and well-nourished.  HENT:  Head: Normocephalic and atraumatic.  Right Ear: External ear normal.  Left Ear: External ear normal.  Nose: Nose normal.  Mouth/Throat: Mucous membranes are dry.  Eyes: EOM are normal. Pupils are equal, round, and reactive to light. Right eye exhibits no discharge. Left eye exhibits no discharge.  Neck: Neck supple.  Cardiovascular: Normal rate, regular rhythm and normal heart sounds.   Pulmonary/Chest: Effort normal and breath sounds normal.  Abdominal: Soft. There is no tenderness.  Neurological: She is alert and oriented to person, place, and time.  CN 2-12 grossly intact. 5/5 strength in all 4 extremities. Grossly normal  sensation  Skin: Skin is warm and dry.  Nursing note and vitals reviewed.   ED Course  Procedures (including critical care time) Labs Review Labs Reviewed  BASIC METABOLIC PANEL - Abnormal; Notable for the following:    Sodium 134 (*)    Chloride 96 (*)    CO2 21 (*)    Glucose, Bld 850 (*)    BUN 52 (*)    Creatinine, Ser 1.85 (*)    GFR calc non Af Amer 24 (*)    GFR calc Af Amer 28 (*)    Anion gap 17 (*)    All other components within normal limits  URINALYSIS, ROUTINE W REFLEX MICROSCOPIC (NOT AT [facility]) - Abnormal; Notable for the following:    Specific Gravity, Urine 1.031 (*)    Glucose, UA >1000 (*)    Hgb urine dipstick MODERATE (*)    Ketones,  ur 15 (*)    Leukocytes, UA TRACE (*)    All other components within normal limits  BLOOD GAS, VENOUS - Abnormal; Notable for the following:    pH, Ven 7.447 (*)    pCO2, Ven 28.0 (*)    pO2, Ven 71.8 (*)    Bicarbonate 19.1 (*)    Acid-base deficit 3.4 (*)    All other components within normal limits  URINE MICROSCOPIC-ADD ON - Abnormal; Notable for the following:    Squamous Epithelial / LPF 0-5 (*)    All other components within normal limits  CBG MONITORING, ED - Abnormal; Notable for the following:    Glucose-Capillary >600 (*)    All other components within normal limits  CBC    Imaging Review No results found. I have personally reviewed and evaluated these images and lab results as part of my medical decision-making.   EKG Interpretation   Date/Time:  Friday July 28 2015 16:30:28 EST Ventricular Rate:  75 PR Interval:  181 QRS Duration: 112 QT Interval:  387 QTC Calculation: 432 R Axis:   -44 Text Interpretation:  Sinus rhythm Left anterior fascicular block Left  ventricular hypertrophy Probable anterior infarct, age indeterminate No  significant change since last tracing Confirmed by [place]  MD, [place]  ([id number]) on 07/28/2015 4:50:15 PM      MDM   Final diagnoses:  Diabetic ketoacidosis  without coma associated with diabetes mellitus due to underlying condition [facility])    Patient appears to have DKA, likely from pancreatic insufficiency from her cancer. This likely explains her fatigue/weakness as well as polydipsia and polyuria. Given IV fluids and will be started on insulin drip and admitted to the hospitalist for further care. This is a new diagnosis for her so she will need treatment as well as teaching. Patient is hemodynamically stable. No infectious symptoms.    Sherwood Gambler, MD 07/28/15 2012

## 2015-07-29 DIAGNOSIS — E0810 Diabetes mellitus due to underlying condition with ketoacidosis without coma: Secondary | ICD-10-CM

## 2015-07-29 DIAGNOSIS — E1101 Type 2 diabetes mellitus with hyperosmolarity with coma: Secondary | ICD-10-CM | POA: Diagnosis present

## 2015-07-29 DIAGNOSIS — E1300 Other specified diabetes mellitus with hyperosmolarity without nonketotic hyperglycemic-hyperosmolar coma (NKHHC): Secondary | ICD-10-CM | POA: Diagnosis present

## 2015-07-29 LAB — BASIC METABOLIC PANEL
Anion gap: 12 (ref 5–15)
Anion gap: 7 (ref 5–15)
Anion gap: 8 (ref 5–15)
Anion gap: 7 (ref 5–15)
BUN: 31 mg/dL — AB (ref 6–20)
BUN: 40 mg/dL — AB (ref 6–20)
BUN: 41 mg/dL — AB (ref 6–20)
BUN: 44 mg/dL — AB (ref 6–20)
CHLORIDE: 111 mmol/L (ref 101–111)
CHLORIDE: 106 mmol/L (ref 101–111)
CHLORIDE: 111 mmol/L (ref 101–111)
CHLORIDE: 111 mmol/L (ref 101–111)
CO2: 24 mmol/L (ref 22–32)
CO2: 23 mmol/L (ref 22–32)
CO2: 23 mmol/L (ref 22–32)
CO2: 25 mmol/L (ref 22–32)
CREATININE: 1.26 mg/dL — AB (ref 0.44–1.00)
CREATININE: 1.21 mg/dL — AB (ref 0.44–1.00)
CREATININE: 1.39 mg/dL — AB (ref 0.44–1.00)
CREATININE: 1.44 mg/dL — AB (ref 0.44–1.00)
Calcium: 8.9 mg/dL (ref 8.9–10.3)
Calcium: 9.2 mg/dL (ref 8.9–10.3)
Calcium: 8.9 mg/dL (ref 8.9–10.3)
Calcium: 8.4 mg/dL — ABNORMAL LOW (ref 8.9–10.3)
GFR calc Af Amer: 37 mL/min — ABNORMAL LOW (ref >60)
GFR calc Af Amer: 39 mL/min — ABNORMAL LOW (ref >60)
GFR calc Af Amer: 46 mL/min — ABNORMAL LOW (ref >60)
GFR calc Af Amer: 44 mL/min — ABNORMAL LOW (ref >60)
GFR calc non Af Amer: 32 mL/min — ABNORMAL LOW (ref >60)
GFR calc non Af Amer: 34 mL/min — ABNORMAL LOW (ref >60)
GFR calc non Af Amer: 40 mL/min — ABNORMAL LOW (ref >60)
GFR calc non Af Amer: 38 mL/min — ABNORMAL LOW (ref >60)
GLUCOSE: 168 mg/dL — AB (ref 65–99)
GLUCOSE: 166 mg/dL — AB (ref 65–99)
GLUCOSE: 168 mg/dL — AB (ref 65–99)
GLUCOSE: 339 mg/dL — AB (ref 65–99)
POTASSIUM: 3.5 mmol/L (ref 3.5–5.1)
POTASSIUM: 3.6 mmol/L (ref 3.5–5.1)
Potassium: 3.4 mmol/L — ABNORMAL LOW (ref 3.5–5.1)
Potassium: 3.6 mmol/L (ref 3.5–5.1)
SODIUM: 142 mmol/L (ref 135–145)
SODIUM: 142 mmol/L (ref 135–145)
SODIUM: 143 mmol/L (ref 135–145)
Sodium: 141 mmol/L (ref 135–145)

## 2015-07-29 LAB — GLUCOSE, CAPILLARY
GLUCOSE-CAPILLARY: 145 mg/dL — AB (ref 65–99)
GLUCOSE-CAPILLARY: 224 mg/dL — AB (ref 65–99)
GLUCOSE-CAPILLARY: 229 mg/dL — AB (ref 65–99)
GLUCOSE-CAPILLARY: 325 mg/dL — AB (ref 65–99)
GLUCOSE-CAPILLARY: 95 mg/dL (ref 65–99)
GLUCOSE-CAPILLARY: 98 mg/dL (ref 65–99)
Glucose-Capillary: 136 mg/dL — ABNORMAL HIGH (ref 65–99)
Glucose-Capillary: 157 mg/dL — ABNORMAL HIGH (ref 65–99)
Glucose-Capillary: 160 mg/dL — ABNORMAL HIGH (ref 65–99)
Glucose-Capillary: 185 mg/dL — ABNORMAL HIGH (ref 65–99)
Glucose-Capillary: 187 mg/dL — ABNORMAL HIGH (ref 65–99)
Glucose-Capillary: 187 mg/dL — ABNORMAL HIGH (ref 65–99)
Glucose-Capillary: 247 mg/dL — ABNORMAL HIGH (ref 65–99)

## 2015-07-29 LAB — HEMOGLOBIN A1C
HEMOGLOBIN A1C: 10.7 % — AB (ref 4.8–5.6)
Mean Plasma Glucose: 260 mg/dL

## 2015-07-29 MED ORDER — INSULIN ASPART 100 UNIT/ML ~~LOC~~ SOLN
0.0000 [IU] | Freq: Three times a day (TID) | SUBCUTANEOUS | Status: DC
  Administered 2015-07-29: 3 [IU] via SUBCUTANEOUS
  Administered 2015-07-29: 5 [IU] via SUBCUTANEOUS
  Administered 2015-07-29: 2 [IU] via SUBCUTANEOUS
  Administered 2015-07-30 – 2015-07-31 (×3): 5 [IU] via SUBCUTANEOUS
  Administered 2015-07-31: 3 [IU] via SUBCUTANEOUS
  Administered 2015-08-01: 2 [IU] via SUBCUTANEOUS

## 2015-07-29 MED ORDER — INSULIN GLARGINE 100 UNIT/ML ~~LOC~~ SOLN
10.0000 [IU] | Freq: Every day | SUBCUTANEOUS | Status: DC
  Administered 2015-07-29: 10 [IU] via SUBCUTANEOUS
  Filled 2015-07-29 (×2): qty 0.1

## 2015-07-29 MED ORDER — ENOXAPARIN SODIUM 40 MG/0.4ML ~~LOC~~ SOLN
40.0000 mg | SUBCUTANEOUS | Status: DC
  Administered 2015-07-29 – 2015-07-31 (×3): 40 mg via SUBCUTANEOUS
  Filled 2015-07-29 (×3): qty 0.4

## 2015-07-29 MED ORDER — INSULIN ASPART 100 UNIT/ML ~~LOC~~ SOLN: 0.0000 [IU] | Freq: Every day | SUBCUTANEOUS | Status: DC

## 2015-07-29 MED ORDER — SODIUM CHLORIDE 0.9 % IV SOLN
INTRAVENOUS | Status: DC
  Administered 2015-07-29 – 2015-07-30 (×3): via INTRAVENOUS
  Administered 2015-07-31: 1000 mL via INTRAVENOUS
  Administered 2015-07-31 – 2015-08-01 (×2): via INTRAVENOUS

## 2015-07-29 NOTE — Progress Notes (Signed)
[facility] PROGRESS NOTE   Linda Bentley TIR:[id number] DOB: 1928-10-19 DOA: 07/28/2015 PCP: Drema Pry, DO  HPI/Subjective: Feels much better, still have some weakness. No fever or chills, no slurred speech.  Assessment/Plan: Principal Problem:   DKA (diabetic ketoacidoses) (HCC) Active Problems:   Chronic renal insufficiency   Pancreatic cancer (HCC)   Dehydration   Secondary DM with DKA, uncontrolled ([place])   Hyperosmolar nonketotic state with secondary diabetes - Diabetes is new diagnosis, likely secondary to pancreatic adenocarcinoma - Serum glucose 345 ten days ago, previously not this elevated - Hemoglobin A1c 5.9% on 03/13/2015 - Serum glucose 850 on presentation, serum bicarbonate 21, anion gap 17, urine ketones present - Bolused 1 L NS in ED, second liter ordered but IV infiltrated, IV therapy consulted - This is treated with intensive IV insulin therapy, blood sugar this morning is 160. - IV insulin discontinued, started on 10 units of Lantus insulin and sliding scale. - We'll transfer out of the SDU, continue aggressive hydration with IV fluids.  Chronic kidney disease stage IV - SCr 1.85 on admit (eGFR 24), w/ apparent b/l ~1.55 - Patient very dehydrated in setting of HHS, anticipate improvement with fluid resuscitation protocol - Avoiding nephrotoxins as feasible  Pancreatic cancer - Recent diagnosis - ERCP on 06/22/2015 with malignant appearing lesion in the pancreatic head, stent was placed and pressure biopsy taken, consistent with adenocarcinoma - PET scan suggest disease is localized to the pancreatic head - Per oncology notes, patient has declined surgical treatment, opting for chemotherapy only - Oncologist will be notified of patient's admission - Continue Creon, holding Decadron for now in setting of HHS   Hypertension  - Managed at home with labetalol  - Currently hypertensive, worse after fluid bolus  - Will manage with IV pushes of  hydralazine as needed for now  - If IV access not achieved shortly, will use POs, clonidine or nitro patch prn  Electrolyte derangements - Secondary to DKA, treating DKA as above  - BMP q4h, replete as needed   Code Status: DNR Family Communication: Plan discussed with the patient. Disposition Plan: Transfer to med surge bed. Diet: Diet Carb Modified Fluid consistency:: Thin; Room service appropriate?: Yes  Consultants:  None  Procedures:  None  Antibiotics:  None   Objective: Filed Vitals:   07/29/15 0700 07/29/15 0800  BP:  115/84  Pulse: 59 58  Temp:    Resp: 25 18    Intake/Output Summary (Last 24 hours) at 07/29/15 1045 Last data filed at 07/29/15 0700  Gross per 24 hour  Intake 1164.58 ml  Output    600 ml  Net 564.58 ml   Filed Weights   07/28/15 2100 07/29/15 0400  Weight: 78 kg (171 lb 15.3 oz) 81.8 kg (180 lb 5.4 oz)    Exam: General: Alert and awake, oriented x3, not in any acute distress. HEENT: anicteric sclera, pupils reactive to light and accommodation, EOMI CVS: S1-S2 clear, no murmur rubs or gallops Chest: clear to auscultation bilaterally, no wheezing, rales or rhonchi Abdomen: soft nontender, nondistended, normal bowel sounds, no organomegaly Extremities: no cyanosis, clubbing or edema noted bilaterally Neuro: Cranial nerves II-XII intact, no focal neurological deficits  Data Reviewed: Basic Metabolic Panel:  Recent Labs Lab 07/28/15 1655 07/28/15 1959 07/29/15 0003 07/29/15 0346 07/29/15 0751  NA 134* 137 142 143 142  K 5.1 5.2* 3.5 3.4* 3.6  CL 96* 98* 106 111 111  CO2 21* _0 GLUCOSE 850* 751* 339*  166* 168*  BUN 52* 50* 44* 41* 40*  CREATININE 1.85* 1.82* 1.44* 1.39* 1.21*  CALCIUM 9.8 10.0 8.9 9.2 8.9  MG  --  2.5*  --   --   --   PHOS  --  3.9  --   --   --    Liver Function Tests: No results for input(s): AST, ALT, ALKPHOS, BILITOT, PROT, ALBUMIN in the last 168 hours. No results for input(s): LIPASE,  AMYLASE in the last 168 hours. No results for input(s): AMMONIA in the last 168 hours. CBC:  Recent Labs Lab 07/28/15 1655  WBC 7.9  HGB 12.3  HCT 36.8  MCV 89.5  PLT 162   Cardiac Enzymes:  Recent Labs Lab 07/28/15 1959  TROPONINI 0.05*   BNP (last 3 results) No results for input(s): BNP in the last 8760 hours.  ProBNP (last 3 results) No results for input(s): PROBNP in the last 8760 hours.  CBG:  Recent Labs Lab 07/29/15 0304 07/29/15 0354 07/29/15 0459 07/29/15 0548 07/29/15 0659  GLUCAP 187* 157* 95 98 136*    Micro Recent Results (from the past 240 hour(s))  MRSA PCR Screening     Status: None   Collection Time: 07/28/15  9:04 PM  Result Value Ref Range Status   MRSA by PCR NEGATIVE NEGATIVE Final    Comment:        The GeneXpert MRSA Assay (FDA approved for NASAL specimens only), is one component of a comprehensive MRSA colonization surveillance program. It is not intended to diagnose MRSA infection nor to guide or monitor treatment for MRSA infections.      Studies: No results found.  Scheduled Meds: . citalopram  20 mg Oral Daily  . enoxaparin (LOVENOX) injection  30 mg Subcutaneous Q24H  . insulin aspart  0-15 Units Subcutaneous TID WC  . insulin aspart  0-5 Units Subcutaneous QHS  . insulin glargine  10 Units Subcutaneous QHS  . lipase/protease/amylase  12,000 Units Oral TID AC  . LORazepam  0.5 mg Oral QHS  . pantoprazole  80 mg Oral Daily  . senna-docusate  2 tablet Oral QHS   Continuous Infusions: . sodium chloride 100 mL/hr at 07/29/15 0915       Time spent: 35 minutes    [facility] A  [facility] Pager [phone] If 7PM-7AM, please contact night-coverage at [email or web address], password [facility] 07/29/2015, 10:45 AM  LOS: 1 day

## 2015-07-29 NOTE — Plan of Care (Signed)
Problem: Safety: Goal: Ability to remain free from injury will improve Outcome: Progressing Patient fall risk assessed.  Bed alarm on.  Bed is in lowest position.  Call bell within reach.  Patient room is near nurse's station.

## 2015-07-29 NOTE — Progress Notes (Signed)
Report called and given to Lynwood Dawley, rn on [place] is transferring to 1343. Family at bedside. Vwilliams,rn.

## 2015-07-29 NOTE — Progress Notes (Signed)
Pt transferred to 1343 (non-telemetry unit). Orders for cardiac monitoring remained in transfer orders. 3rd floor RN notified this Probation officer of orders. MD made aware. Order given by MD  to DC cardiac monitoring. Vwilliams, rn.

## 2015-07-30 DIAGNOSIS — E1300 Other specified diabetes mellitus with hyperosmolarity without nonketotic hyperglycemic-hyperosmolar coma (NKHHC): Secondary | ICD-10-CM

## 2015-07-30 LAB — BASIC METABOLIC PANEL
ANION GAP: 8 (ref 5–15)
ANION GAP: 9 (ref 5–15)
BUN: 25 mg/dL — ABNORMAL HIGH (ref 6–20)
BUN: 20 mg/dL (ref 6–20)
CALCIUM: 8.1 mg/dL — AB (ref 8.9–10.3)
CALCIUM: 8.1 mg/dL — AB (ref 8.9–10.3)
CO2: 21 mmol/L — ABNORMAL LOW (ref 22–32)
CO2: 20 mmol/L — ABNORMAL LOW (ref 22–32)
Chloride: 111 mmol/L (ref 101–111)
Chloride: 110 mmol/L (ref 101–111)
Creatinine, Ser: 1.19 mg/dL — ABNORMAL HIGH (ref 0.44–1.00)
Creatinine, Ser: 1.14 mg/dL — ABNORMAL HIGH (ref 0.44–1.00)
GFR, EST AFRICAN AMERICAN: 47 mL/min — AB (ref >60)
GFR, EST AFRICAN AMERICAN: 50 mL/min — AB (ref >60)
GFR, EST NON AFRICAN AMERICAN: 41 mL/min — AB (ref >60)
GFR, EST NON AFRICAN AMERICAN: 43 mL/min — AB (ref >60)
GLUCOSE: 245 mg/dL — AB (ref 65–99)
Glucose, Bld: 219 mg/dL — ABNORMAL HIGH (ref 65–99)
Potassium: 3.7 mmol/L (ref 3.5–5.1)
Potassium: 3.8 mmol/L (ref 3.5–5.1)
SODIUM: 139 mmol/L (ref 135–145)
Sodium: 140 mmol/L (ref 135–145)

## 2015-07-30 LAB — GLUCOSE, CAPILLARY
GLUCOSE-CAPILLARY: 236 mg/dL — AB (ref 65–99)
GLUCOSE-CAPILLARY: 202 mg/dL — AB (ref 65–99)
Glucose-Capillary: 158 mg/dL — ABNORMAL HIGH (ref 65–99)
Glucose-Capillary: 142 mg/dL — ABNORMAL HIGH (ref 65–99)

## 2015-07-30 MED ORDER — INSULIN ASPART 100 UNIT/ML ~~LOC~~ SOLN
5.0000 [IU] | Freq: Three times a day (TID) | SUBCUTANEOUS | Status: DC
  Administered 2015-07-30 (×2): 5 [IU] via SUBCUTANEOUS

## 2015-07-30 MED ORDER — INSULIN STARTER KIT- SYRINGES (ENGLISH)
1.0000 | Freq: Once | Status: AC
  Administered 2015-07-30: 1
  Filled 2015-07-30: qty 1

## 2015-07-30 MED ORDER — INSULIN GLARGINE 100 UNIT/ML ~~LOC~~ SOLN
15.0000 [IU] | Freq: Every day | SUBCUTANEOUS | Status: DC
  Administered 2015-07-30: 15 [IU] via SUBCUTANEOUS
  Filled 2015-07-30: qty 0.15

## 2015-07-30 MED ORDER — LIVING WELL WITH DIABETES BOOK
Freq: Once | Status: AC
  Administered 2015-07-30: 14:00:00
  Filled 2015-07-30: qty 1

## 2015-07-30 NOTE — Progress Notes (Signed)
Inpatient Diabetes Program Recommendations  AACE/ADA: New Consensus Statement on Inpatient Glycemic Control (2015)  Target Ranges:  Prepandial:   less than 140 mg/dL      Peak postprandial:   less than 180 mg/dL (1-2 hours)      Critically ill patients:  140 - 180 mg/dL   Results for ABA, MATHISON (MRN [id number]) as of 07/30/2015 10:01  Ref. Range 07/29/2015 09:03 07/29/2015 12:00 07/29/2015 17:28 07/29/2015 20:57  Glucose-Capillary Latest Ref Range: 65-99 mg/dL 185 (H) 224 (H) 145 (H) 187 (H)    Results for AERILYN, CAPARAS (MRN [id number]) as of 07/30/2015 10:01  Ref. Range 07/30/2015 07:51  Glucose-Capillary Latest Ref Range: 65-99 mg/dL 236 (H)    Admit with: Hyperglycemia- New diagnosis of DM  History: CKD4, Pancreatic Cancer (recent diagnosis- takes Decadron at home)  Current Insulin Orders: Lantus 15 units QHS      Novolog Moderate SSI (0-15 units) TID AC + HS      Novolog 5 units tidwc     -Note patient with new diagnosis of DM likely due to pancreatic cancer.  Transitioned off IV insulin drip yesterday to Lantus and Novolog.  Decadron on hold for now given hyperglycemia.  -Will have RNs begin basic DM education with patient at bedside.  -Note Lantus increased to 15 units QHS today and Novolog 5 units tidwc started today as well.   --Will follow patient during hospitalization--  Wyn Quaker RN, MSN, CDE Diabetes Coordinator Inpatient Glycemic Control Team Team Pager: [phone] (8a-5p)

## 2015-07-30 NOTE — Progress Notes (Signed)
[facility] PROGRESS NOTE   Linda Bentley WGY:[id number] DOB: Feb 07, 1929 DOA: 07/28/2015 PCP: Drema Pry, DO  HPI/Subjective: Still has very dry lips, denies any other complaints. Concerned about going home while feeling this week.  Assessment/Plan: Principal Problem:   HHNC (hyperglycemic hyperosmolar nonketotic coma) ([place]) Active Problems:   Chronic renal insufficiency   Pancreatic cancer (HCC)   Dehydration   Secondary DM with DKA, uncontrolled ([place])   Hyperosmolarity due to secondary diabetes ([place])   Hyperosmolar hyperglycemic nonketotic syndrome with secondary diabetes - Diabetes is new diagnosis, likely secondary to pancreatic adenocarcinoma - Serum glucose 345 ten days ago, previously not this elevated - Hemoglobin A1c 5.9% on 03/13/2015 - Serum glucose 850 on presentation, serum bicarbonate 21, anion gap 17, urine ketones present - Bolused 1 L NS in ED, second liter ordered but IV infiltrated, continue IV fluids - This is treated with intensive IV insulin therapy, insulin drip discontinued when blood sugar down to 160. - Started on 10 units of Lantus last night, blood sugar in the morning is 219, I'll increase to 15 units. - I added 5 units of NovoLog with meals.  Chronic kidney disease stage IV - SCr 1.85 on admit (eGFR 24), w/ apparent b/l ~1.55 - Patient very dehydrated in setting of HHNS, anticipate improvement with fluid resuscitation protocol - Avoiding nephrotoxins as feasible, check BMP in a.m.  Pancreatic cancer - Recent diagnosis - ERCP on 06/22/2015 with malignant appearing lesion in the pancreatic head, stent was placed and pressure biopsy taken, consistent with adenocarcinoma - PET scan suggest disease is localized to the pancreatic head - Per oncology notes, patient has declined surgical treatment, opting for chemotherapy only - Oncologist will be notified of patient's admission - Continue Creon, holding Decadron for now in setting of HHS    Hypertension  - Managed at home with labetalol  - Currently hypertensive, worse after fluid bolus  - Will manage with IV pushes of hydralazine as needed for now  - If IV access not achieved shortly, will use POs, clonidine or nitro patch prn  Electrolyte derangements - Secondary to the extreme hyperglycemia, this is resolved after IV fluid hydration.   Code Status: DNR Family Communication: Plan discussed with the patient. Disposition Plan: Transfer to med surge bed. Diet: Diet Carb Modified Fluid consistency:: Thin; Room service appropriate?: Yes  Consultants:  None  Procedures:  None  Antibiotics:  None   Objective: Filed Vitals:   07/29/15 2058 07/30/15 0459  BP: 136/52 145/63  Pulse: 62 65  Temp: 98 F (36.7 C) 98 F (36.7 C)  Resp: 16 14    Intake/Output Summary (Last 24 hours) at 07/30/15 0906 Last data filed at 07/30/15 0547  Gross per 24 hour  Intake 3882.4 ml  Output    100 ml  Net 3782.4 ml   Filed Weights   07/28/15 2100 07/29/15 0400  Weight: 78 kg (171 lb 15.3 oz) 81.8 kg (180 lb 5.4 oz)    Exam: General: Alert and awake, oriented x3, not in any acute distress. HEENT: anicteric sclera, pupils reactive to light and accommodation, EOMI CVS: S1-S2 clear, no murmur rubs or gallops Chest: clear to auscultation bilaterally, no wheezing, rales or rhonchi Abdomen: soft nontender, nondistended, normal bowel sounds, no organomegaly Extremities: no cyanosis, clubbing or edema noted bilaterally Neuro: Cranial nerves II-XII intact, no focal neurological deficits  Data Reviewed: Basic Metabolic Panel:  Recent Labs Lab 07/28/15 1959 07/29/15 0003 07/29/15 0346 07/29/15 0751 07/29/15 1708 07/30/15 0450  NA 137  142 143 142 141 140  K 5.2* 3.5 3.4* 3.6 3.6 3.7  CL 98* 106 111 111 111 111  CO2 23 24 25 23 23  21*  GLUCOSE 751* 339* 166* 168* 168* 219*  BUN 50* 44* 41* 40* 31* 25*  CREATININE 1.82* 1.44* 1.39* 1.21* 1.26* 1.19*  CALCIUM  10.0 8.9 9.2 8.9 8.4* 8.1*  MG 2.5*  --   --   --   --   --   PHOS 3.9  --   --   --   --   --    Liver Function Tests: No results for input(s): AST, ALT, ALKPHOS, BILITOT, PROT, ALBUMIN in the last 168 hours. No results for input(s): LIPASE, AMYLASE in the last 168 hours. No results for input(s): AMMONIA in the last 168 hours. CBC:  Recent Labs Lab 07/28/15 1655  WBC 7.9  HGB 12.3  HCT 36.8  MCV 89.5  PLT 162   Cardiac Enzymes:  Recent Labs Lab 07/28/15 1959  TROPONINI 0.05*   BNP (last 3 results) No results for input(s): BNP in the last 8760 hours.  ProBNP (last 3 results) No results for input(s): PROBNP in the last 8760 hours.  CBG:  Recent Labs Lab 07/29/15 0903 07/29/15 1200 07/29/15 1728 07/29/15 2057 07/30/15 0751  GLUCAP 185* 224* 145* 187* 236*    Micro Recent Results (from the past 240 hour(s))  MRSA PCR Screening     Status: None   Collection Time: 07/28/15  9:04 PM  Result Value Ref Range Status   MRSA by PCR NEGATIVE NEGATIVE Final    Comment:        The GeneXpert MRSA Assay (FDA approved for NASAL specimens only), is one component of a comprehensive MRSA colonization surveillance program. It is not intended to diagnose MRSA infection nor to guide or monitor treatment for MRSA infections.      Studies: No results found.  Scheduled Meds: . citalopram  20 mg Oral Daily  . enoxaparin (LOVENOX) injection  40 mg Subcutaneous Q24H  . insulin aspart  0-15 Units Subcutaneous TID WC  . insulin aspart  5 Units Subcutaneous TID WC  . insulin glargine  15 Units Subcutaneous QHS  . lipase/protease/amylase  12,000 Units Oral TID AC  . LORazepam  0.5 mg Oral QHS  . pantoprazole  80 mg Oral Daily  . senna-docusate  2 tablet Oral QHS   Continuous Infusions: . sodium chloride 100 mL/hr at 07/30/15 0519       Time spent: 35 minutes    [facility] A  [facility] Pager [phone] If 7PM-7AM, please contact night-coverage at  [email or web address], password [facility] 07/30/2015, 9:06 AM  LOS: 2 days

## 2015-07-31 LAB — BASIC METABOLIC PANEL
ANION GAP: 9 (ref 5–15)
BUN: 16 mg/dL (ref 6–20)
CALCIUM: 8 mg/dL — AB (ref 8.9–10.3)
CO2: 17 mmol/L — AB (ref 22–32)
CREATININE: 1.04 mg/dL — AB (ref 0.44–1.00)
Chloride: 113 mmol/L — ABNORMAL HIGH (ref 101–111)
GFR, EST AFRICAN AMERICAN: 56 mL/min — AB (ref >60)
GFR, EST NON AFRICAN AMERICAN: 48 mL/min — AB (ref >60)
Glucose, Bld: 168 mg/dL — ABNORMAL HIGH (ref 65–99)
Potassium: 3.4 mmol/L — ABNORMAL LOW (ref 3.5–5.1)
Sodium: 139 mmol/L (ref 135–145)

## 2015-07-31 LAB — GLUCOSE, CAPILLARY
GLUCOSE-CAPILLARY: 220 mg/dL — AB (ref 65–99)
GLUCOSE-CAPILLARY: 207 mg/dL — AB (ref 65–99)
Glucose-Capillary: 162 mg/dL — ABNORMAL HIGH (ref 65–99)

## 2015-07-31 MED ORDER — POTASSIUM CHLORIDE CRYS ER 20 MEQ PO TBCR
40.0000 meq | EXTENDED_RELEASE_TABLET | Freq: Four times a day (QID) | ORAL | Status: AC
  Administered 2015-07-31 (×2): 40 meq via ORAL
  Filled 2015-07-31 (×3): qty 2

## 2015-07-31 MED ORDER — INSULIN GLARGINE 100 UNIT/ML ~~LOC~~ SOLN
18.0000 [IU] | Freq: Every day | SUBCUTANEOUS | Status: DC
  Administered 2015-07-31: 18 [IU] via SUBCUTANEOUS
  Filled 2015-07-31: qty 0.18

## 2015-07-31 MED ORDER — INSULIN ASPART 100 UNIT/ML ~~LOC~~ SOLN
6.0000 [IU] | Freq: Three times a day (TID) | SUBCUTANEOUS | Status: DC
  Administered 2015-07-31 – 2015-08-01 (×2): 6 [IU] via SUBCUTANEOUS

## 2015-07-31 NOTE — NC FL2 (Signed)
[place] LEVEL OF CARE SCREENING TOOL     IDENTIFICATION  Patient Name: Linda Bentley Birthdate: February 16, 1929 Sex: female Admission Date (Current Location): 07/28/2015  [facility] and [place] Number:  Herbalist and Address:  [facility],  [place] [address], [place]      Provider Number: [id number]  Attending Physician Name and Address:  Verlee Monte, MD  Relative Name and Phone Number:       Current Level of Care: Hospital Recommended Level of Care: [place] Prior Approval Number:    Date Approved/Denied:   PASRR Number: [id number]  Discharge Plan: SNF    Current Diagnoses: Patient Active Problem List   Diagnosis Date Noted  . Hyperosmolarity due to secondary diabetes ([place]) 07/29/2015  . [place] (hyperglycemic hyperosmolar nonketotic coma) ([place]) 07/29/2015  . Secondary DM with DKA, uncontrolled ([place]) 07/28/2015  . Pancreatic cancer ([place]) 07/18/2015  . Dehydration 07/18/2015  . Common biliary duct obstruction 06/22/2015  . Abnormal CT scan, pancreas or bile duct 06/22/2015  . Obstructive jaundice 06/22/2015  . Elevated LFTs 06/22/2015  . Dysphagia 10/10/2014  . Headache 10/10/2014  . Counseling regarding end of life decision making 10/10/2014  . Preventative health care 07/11/2014  . Dizziness and giddiness 07/11/2014  . Anemia 01/25/2014  . Urinary incontinence 04/08/2012  . Back pain 04/01/2012  . Restless legs syndrome (RLS) 07/08/2011  . UNSPECIFIED PERIPHERAL VASCULAR DISEASE 06/08/2010  . OSTEOARTHRITIS, KNEE, RIGHT 12/28/2008  . OBESITY 04/14/2008  . Hyperlipidemia 02/26/2007  . DEPRESSION 02/26/2007  . Essential hypertension 02/26/2007  . GERD 02/26/2007  . Chronic renal insufficiency 02/26/2007  . OSTEOPENIA 02/26/2007  . Prediabetes 02/26/2007    Orientation RESPIRATION BLADDER Height & Weight    Self, Time, Situation, Place  Normal Incontinent 5\' 1"  (154.9 cm) 180 lbs.  BEHAVIORAL  SYMPTOMS/MOOD NEUROLOGICAL BOWEL NUTRITION STATUS   (no behaviors)  (NONE) Incontinent Diet (Diet Carb Modified)  AMBULATORY STATUS COMMUNICATION OF NEEDS Skin   Limited Assist Verbally Normal                       Personal Care Assistance Level of Assistance  Bathing, Feeding, Dressing Bathing Assistance: Limited assistance Feeding assistance: Independent Dressing Assistance: Limited assistance     Functional Limitations Info  Sight, Hearing, Speech Sight Info: Adequate Hearing Info: Adequate Speech Info: Adequate    SPECIAL CARE FACTORS FREQUENCY  PT (By licensed PT), OT (By licensed OT)     PT Frequency: 5 x a week OT Frequency: 5 x a week            Contractures Contractures Info: Not present    Additional Factors Info  Code Status, Allergies Code Status Info: DNR code status Allergies Info: Diltiazem Hcl           Current Medications (07/31/2015):  This is the current hospital active medication list Current Facility-Administered Medications  Medication Dose Route Frequency Provider Last Rate Last Dose  . 0.9 %  sodium chloride infusion   Intravenous Continuous Verlee Monte, MD 100 mL/hr at 07/31/15 1022 1,000 mL at 07/31/15 1022  . calcium carbonate (TUMS - dosed in mg elemental calcium) chewable tablet 200-400 mg of elemental calcium  1-2 tablet Oral Daily PRN Vianne Bulls, MD      . citalopram (CELEXA) tablet 20 mg  20 mg Oral Daily Vianne Bulls, MD   20 mg at 07/31/15 1026  . enoxaparin (LOVENOX) injection 40 mg  40  mg Subcutaneous Q24H Thomes Lolling, RPH   40 mg at 07/30/15 2241  . hydrALAZINE (APRESOLINE) injection 10 mg  10 mg Intravenous Q4H PRN Ilene Qua Opyd, MD      . insulin aspart (novoLOG) injection 0-15 Units  0-15 Units Subcutaneous TID WC Dianne Dun, NP   5 Units at 07/31/15 1350  . insulin aspart (novoLOG) injection 6 Units  6 Units Subcutaneous TID WC Verlee Monte, MD   6 Units at 07/31/15 1350  . insulin glargine  (LANTUS) injection 18 Units  18 Units Subcutaneous QHS Verlee Monte, MD      . lipase/protease/amylase (CREON) capsule 12,000 Units  12,000 Units Oral TID AC Vianne Bulls, MD   12,000 Units at 07/31/15 1349  . LORazepam (ATIVAN) tablet 0.5 mg  0.5 mg Oral QHS Ilene Qua Opyd, MD   0.5 mg at 07/30/15 2240  . pantoprazole (PROTONIX) EC tablet 80 mg  80 mg Oral Daily Vianne Bulls, MD   80 mg at 07/31/15 1024  . polyethylene glycol powder (GLYCOLAX/MIRALAX) container 17 g  17 g Oral Daily PRN Vianne Bulls, MD      . senna-docusate (Senokot-S) tablet 2 tablet  2 tablet Oral QHS Vianne Bulls, MD   2 tablet at 07/30/15 2240     Discharge Medications: Please see discharge summary for a list of discharge medications.  Relevant Imaging Results:  Relevant Lab Results:   Additional Information SSN: [id number]. Pt has not yet started any treatment for Pancreatic cancer and not yet sure if she will have treatment, but has an appointment scheduled with Dr. Irene Limbo at [facility] on 08/08/15 to discuss options and make decisions about further planning.   Telly Broberg A, LCSW

## 2015-07-31 NOTE — Clinical Social Work Placement (Signed)
   CLINICAL SOCIAL WORK PLACEMENT  NOTE  Date:  07/31/2015  Patient Details  Name: Linda Bentley MRN: [id number] Date of Birth: 1928-12-19  Clinical Social Work is seeking post-discharge placement for this patient at the [place] level of care (*CSW will initial, date and re-position this form in  chart as items are completed):  Yes   Patient/family provided with [place] Work Department's list of facilities offering this level of care within the geographic area requested by the patient (or if unable, by the patient's family).  Yes   Patient/family informed of their freedom to choose among providers that offer the needed level of care, that participate in Medicare, Medicaid or managed care program needed by the patient, have an available bed and are willing to accept the patient.  Yes   Patient/family informed of [place]'s ownership interest in [facility] and [facility], as well as of the fact that they are under no obligation to receive care at these facilities.  PASRR submitted to EDS on       PASRR number received on       Existing PASRR number confirmed on 07/31/15     FL2 transmitted to all facilities in geographic area requested by pt/family on 07/31/15     FL2 transmitted to all facilities within larger geographic area on       Patient informed that his/her managed care company has contracts with or will negotiate with certain facilities, including the following:            Patient/family informed of bed offers received.  Patient chooses bed at       Physician recommends and patient chooses bed at      Patient to be transferred to   on  .  Patient to be transferred to facility by       Patient family notified on   of transfer.  Name of family member notified:        PHYSICIAN Please sign DNR, Please sign FL2     Additional Comment:    _______________________________________________ Ladell Pier,  LCSW 07/31/2015, 3:47 PM

## 2015-07-31 NOTE — Evaluation (Signed)
Physical Therapy Evaluation Patient Details Name: Linda Bentley MRN: [id number] DOB: 09-08-1928 Today's Date: 07/31/2015   History of Present Illness  Linda Bentley is a very pleasant, but unfortunate 81 y.o. female with PMH of hypertension, hyperlipidemia, chronic kidney disease stage III-IV, and recent diagnosis of pancreatic adenocarcinoma who presents  07/28/15 with several weeks of progressive generalized weakness, polyuria, and polydipsia. Initial blood work revealed serum glucose of 850   Clinical Impression  Pt is very pleasant,ambulated x 180 with min asist for safety. Patient prefers to Dc home but feel she will need 24/7 initially to ensure her safety and ability to care for self. If DC home, continue HHPT, aide.  Patient admitted with above diagnosis. Pt currently with functional limitations due to the deficits listed below (see PT Problem List). Pt will benefit from skilled PT to increase their independence and safety with mobility to allow discharge to the venue listed below.      Follow Up Recommendations SNF;Supervision/Assistance - 24 hour (patient has HHPT PTA. With newly dx. of  diabetes and weakness, recommend short term snf to gain independence and gain more information/instruction in her diabetes dx.)    Equipment Recommendations  None recommended by PT    Recommendations for Other Services   OT     Precautions / Restrictions Precautions Precautions: Fall Precaution Comments: urinary incontinence      Mobility  Bed Mobility Overal bed mobility: Independent                Transfers Overall transfer level: Needs assistance Equipment used: Rolling walker (2 wheeled);None Transfers: Sit to/from [employer] to Stand: Min guard         General transfer comment: stands from bed at RW, from Toilet with rail, pivoted from toilet to sink with holding onto sink, rail.   Ambulation/Gait Ambulation/Gait assistance: Min  assist Ambulation Distance (Feet): 20 Feet (then 160', gait is slow, assist  to guide the RW, ending to steer L(?due to RW)) Assistive device: Rolling walker (2 wheeled) Gait Pattern/deviations: Step-through pattern   Gait velocity interpretation: Below normal speed for age/gender General Gait Details: steering assistance of RW, gait speed is slow, able to ambulate  a few steps but holds onto rail, bed. Appears not secure without UE support.  Stairs            Wheelchair Mobility    Modified Rankin (Stroke Patients Only)       Balance Overall balance assessment: History of Falls;Needs assistance Sitting-balance support: Feet supported;No upper extremity supported Sitting balance-Leahy Scale: Good Sitting balance - Comments: leans over and washes legs while sitting on toilet.   Standing balance support: Bilateral upper extremity supported;During functional activity Standing balance-Leahy Scale: Fair                               Pertinent Vitals/Pain Pain Assessment: No/denies pain    Home Living Family/patient expects to be discharged to:: Private residence Living Arrangements: Alone Available Help at Discharge: Family;Available PRN/intermittently Type of Home: Apartment Home Access: Level entry     Home Layout: One level Home Equipment: Tub bench (3 wheeled RW) Additional Comments: has transportation via TEFL teacher from complex at [employer].    Prior Function Level of Independence: Independent with assistive device(s)               Hand Dominance        Extremity/Trunk Assessment  Upper Extremity Assessment: Generalized weakness           Lower Extremity Assessment: Generalized weakness      Cervical / Trunk Assessment: Normal  Communication   Communication: No difficulties  Cognition Arousal/Alertness: Awake/alert Behavior During Therapy: WFL for tasks assessed/performed Overall Cognitive Status: Within  Functional Limits for tasks assessed                      General Comments      Exercises        Assessment/Plan    PT Assessment Patient needs continued PT services  PT Diagnosis Difficulty walking;Generalized weakness   PT Problem List Decreased strength;Decreased activity tolerance;Decreased balance;Decreased mobility;Decreased knowledge of precautions;Decreased safety awareness;Decreased knowledge of use of DME  PT Treatment Interventions DME instruction;Gait training;Functional mobility training;Therapeutic activities;Therapeutic exercise;Patient/family education   PT Goals (Current goals can be found in the Care Plan section) Acute Rehab PT Goals Patient Stated Goal: to go home, walk more PT Goal Formulation: With patient Time For Goal Achievement: 08/14/15 Potential to Achieve Goals: Good    Frequency Min 3X/week   Barriers to discharge Decreased caregiver support      Co-evaluation               End of Session Equipment Utilized During Treatment: Gait belt Activity Tolerance: Patient tolerated treatment well Patient left: in chair;with call bell/phone within reach;with chair alarm set Nurse Communication: Mobility status         Time: [id number] PT Time Calculation (min) (ACUTE ONLY): 28 min   Charges:   PT Evaluation $Initial PT Evaluation Tier I: 1 Procedure PT Treatments $Gait Training: 8-22 mins   PT G Codes:        Claretha Cooper 07/31/2015, 9:12 AM Tresa Endo PT [phone]

## 2015-07-31 NOTE — Clinical Social Work Note (Signed)
Clinical Social Work Assessment  Patient Details  Name: Linda Bentley MRN: [id number] Date of Birth: [birth year]  Date of referral:  07/31/15               Reason for consult:  Discharge Planning                Permission sought to share information with:  Family Supports Permission granted to share information::  Yes, Verbal Permission Granted  Name::     [place]::     Relationship::  sister-in-law  Contact Information:  [phone]  Housing/Transportation Living arrangements for the past 2 months:  Single Family Home Source of Information:  Patient Patient Interpreter Needed:  None Criminal Activity/Legal Involvement Pertinent to Current Situation/Hospitalization:  No - Comment as needed Significant Relationships:  Siblings Lives with:  Self Do you feel safe going back to the place where you live?  No Need for family participation in patient care:  No (Coment)  Care giving concerns:  Pt admitted from home alone. Pt has had increased falls recently. PT recommending SNF for short term rehab.   Social Worker assessment / plan:  CSW received referral for new SNF.  CSW met with pt and pt sister-in-law at bedside. CSW introduced self and explained role. CSW provided supportive listening as pt discussed her recent diagnosis of pancreatic cancer and diabetes. Pt sister-in-law shared that pt has a follow up appointment with oncologist, Dr. Irene Limbo on January 3 to discuss with pt and pt family options to determine if pt will have treatment for pancreatic cancer. Pt does not wish to have surgery. CSW discussed recommendation for short term rehab at [facility]. Pt agreeable to [facility] search. CSW clarified questions and concerns surrounding planning for short term rehab placement.  CSW completed FL2 and initiated SNF search to [facility].   CSW to follow up with pt and pt brother in the morning re: SNF bed offers.   Per MD, anticipate discharge tomorrow.   CSW to  continue to follow to provide support and assist with pt disposition needs.   Employment status:  Retired Nurse, adult PT Recommendations:  [place] / Referral to community resources:  [place]  Patient/Family's Response to care:  Pt alert and oriented x 4. Pt has strong support from pt brother and sister-in-law, but pt makes her own decisions. Pt motivated for rehab and agrees that short term rehab is best plan at time of discharge.  Patient/Family's Understanding of and Emotional Response to Diagnosis, Current Treatment, and Prognosis:  Pt able to discuss her diagnosis and treatment plan and pt family also able to provide input on her current diagnosis and treatment plan. Pt coping appropriately with plans for short term rehab.   Emotional Assessment Appearance:  Appears stated age Attitude/Demeanor/Rapport:  Other (pt appropriate) Affect (typically observed):  Appropriate, Accepting, Pleasant Orientation:  Oriented to Self, Oriented to Place, Oriented to  Time, Oriented to Situation Alcohol / Substance use:  Not Applicable Psych involvement (Current and /or in the community):  No (Comment)  Discharge Needs  Concerns to be addressed:  Discharge Planning Concerns Readmission within the last 30 days:  No Current discharge risk:  Physical Impairment Barriers to Discharge:  Continued Medical Work up   Ladell Pier, LCSW 07/31/2015, 4:05 PM  [phone]

## 2015-07-31 NOTE — Progress Notes (Signed)
[facility]  Patient Status: Active (receiving services up to time of hospitalization)  [facility] is providing the following services: RN and PT  If patient discharges after hours, please call [phone].   Linda Bentley 07/31/2015, 10:04 AM

## 2015-07-31 NOTE — Progress Notes (Signed)
Pt sitting up in chair most the day. Several visitors in today. Pt using bedside commode with assist of one. Very pleasant pt.

## 2015-07-31 NOTE — Progress Notes (Signed)
[facility] PROGRESS NOTE   Linda Bentley YWV:[id number] DOB: 02-Dec-1928 DOA: 07/28/2015 PCP: Drema Pry, DO  HPI/Subjective: Worked with physical therapy today, recommended skilled nursing facility. I have asked the social worker to see her for possible discharge in a.m.  Assessment/Plan: Principal Problem:   HHNC (hyperglycemic hyperosmolar nonketotic coma) ([place]) Active Problems:   Chronic renal insufficiency   Pancreatic cancer (HCC)   Dehydration   Secondary DM with DKA, uncontrolled ([place])   Hyperosmolarity due to secondary diabetes ([place])   Hyperosmolar hyperglycemic nonketotic syndrome with secondary diabetes - Diabetes is new diagnosis, likely secondary to pancreatic adenocarcinoma - Serum glucose 345 ten days ago, previously not this elevated - Hemoglobin A1c 5.9% on 03/13/2015 - Serum glucose 850 on presentation, serum bicarbonate 21, anion gap 17, urine ketones present - Bolused 1 L NS in ED. - This is treated with intensive IV insulin therapy, insulin drip discontinued when blood sugar down to 160. - Insulin regimen adjusted further to 18 units of Lantus at night and 6 units with meals. - Patient appears much better. Has slight acidosis with bicarbonate of 17 without anion gap. Check BMP in a.m.  Chronic kidney disease stage IV - SCr 1.85 on admit (eGFR 24), w/ apparent b/l ~1.55 - Patient very dehydrated in setting of HHNS, anticipate improvement with fluid resuscitation protocol - Avoiding nephrotoxins, check BMP in a.m.  Pancreatic cancer - Recent diagnosis - ERCP on 06/22/2015 with malignant appearing lesion in the pancreatic head, stent was placed and pressure biopsy taken, consistent with adenocarcinoma - PET scan suggest disease is localized to the pancreatic head - Per oncology notes, patient has declined surgical treatment, opting for chemotherapy only - Oncologist will be notified of patient's admission - Continue Creon, holding Decadron for now  in setting of HHS   Hypertension  - Managed at home with labetalol  - Currently hypertensive, worse after fluid bolus  - Will manage with IV pushes of hydralazine as needed for now  - If IV access not achieved shortly, will use POs, clonidine or nitro patch prn  Electrolyte derangements - Secondary to the extreme hyperglycemia, this is resolved after IV fluid hydration.  Hypokalemia Likely secondary to IV hydration without potassium supplementation, replete with oral supplements. Check BMP in a.m.   Code Status: DNR Family Communication: Plan discussed with the patient. Disposition Plan: Probable discharge in a.m. to skilled nursing facility. Diet: Diet Carb Modified Fluid consistency:: Thin; Room service appropriate?: Yes  Consultants:  None  Procedures:  None  Antibiotics:  None   Objective: Filed Vitals:   07/30/15 2110 07/31/15 0506  BP: 144/53 150/59  Pulse: 79 70  Temp: 98.3 F (36.8 C) 97.7 F (36.5 C)  Resp: 20 20    Intake/Output Summary (Last 24 hours) at 07/31/15 1145 Last data filed at 07/30/15 2245  Gross per 24 hour  Intake 1931.5 ml  Output    100 ml  Net 1831.5 ml   Filed Weights   07/28/15 2100 07/29/15 0400  Weight: 78 kg (171 lb 15.3 oz) 81.8 kg (180 lb 5.4 oz)    Exam: General: Alert and awake, oriented x3, not in any acute distress. HEENT: anicteric sclera, pupils reactive to light and accommodation, EOMI CVS: S1-S2 clear, no murmur rubs or gallops Chest: clear to auscultation bilaterally, no wheezing, rales or rhonchi Abdomen: soft nontender, nondistended, normal bowel sounds, no organomegaly Extremities: no cyanosis, clubbing or edema noted bilaterally Neuro: Cranial nerves II-XII intact, no focal neurological deficits  Data Reviewed:  Basic Metabolic Panel:  Recent Labs Lab 07/28/15 1959  07/29/15 0751 07/29/15 1708 07/30/15 0450 07/30/15 1704 07/31/15 0444  NA 137  < > 142 141 140 139 139  K 5.2*  < > 3.6 3.6  3.7 3.8 3.4*  CL 98*  < > 111 111 111 110 113*  CO2 23  < > 23 23 21* 20* 17*  GLUCOSE 751*  < > 168* 168* 219* 245* 168*  BUN 50*  < > 40* 31* 25* 20 16  CREATININE 1.82*  < > 1.21* 1.26* 1.19* 1.14* 1.04*  CALCIUM 10.0  < > 8.9 8.4* 8.1* 8.1* 8.0*  MG 2.5*  --   --   --   --   --   --   PHOS 3.9  --   --   --   --   --   --   < > = values in this interval not displayed. Liver Function Tests: No results for input(s): AST, ALT, ALKPHOS, BILITOT, PROT, ALBUMIN in the last 168 hours. No results for input(s): LIPASE, AMYLASE in the last 168 hours. No results for input(s): AMMONIA in the last 168 hours. CBC:  Recent Labs Lab 07/28/15 1655  WBC 7.9  HGB 12.3  HCT 36.8  MCV 89.5  PLT 162   Cardiac Enzymes:  Recent Labs Lab 07/28/15 1959  TROPONINI 0.05*   BNP (last 3 results) No results for input(s): BNP in the last 8760 hours.  ProBNP (last 3 results) No results for input(s): PROBNP in the last 8760 hours.  CBG:  Recent Labs Lab 07/30/15 0751 07/30/15 1218 07/30/15 1709 07/30/15 2206 07/31/15 0738  GLUCAP 236* 158* 202* 142* 162*    Micro Recent Results (from the past 240 hour(s))  MRSA PCR Screening     Status: None   Collection Time: 07/28/15  9:04 PM  Result Value Ref Range Status   MRSA by PCR NEGATIVE NEGATIVE Final    Comment:        The GeneXpert MRSA Assay (FDA approved for NASAL specimens only), is one component of a comprehensive MRSA colonization surveillance program. It is not intended to diagnose MRSA infection nor to guide or monitor treatment for MRSA infections.      Studies: No results found.  Scheduled Meds: . citalopram  20 mg Oral Daily  . enoxaparin (LOVENOX) injection  40 mg Subcutaneous Q24H  . insulin aspart  0-15 Units Subcutaneous TID WC  . insulin aspart  6 Units Subcutaneous TID WC  . insulin glargine  18 Units Subcutaneous QHS  . lipase/protease/amylase  12,000 Units Oral TID AC  . LORazepam  0.5 mg Oral QHS  .  pantoprazole  80 mg Oral Daily  . potassium chloride  40 mEq Oral Q6H  . senna-docusate  2 tablet Oral QHS   Continuous Infusions: . sodium chloride 1,000 mL (07/31/15 1022)       Time spent: 35 minutes    [facility] A  [facility] Pager [phone] If 7PM-7AM, please contact night-coverage at [email or web address], password [email or web address] 07/31/2015, 11:45 AM  LOS: 3 days

## 2015-07-31 NOTE — Evaluation (Signed)
Occupational Therapy Evaluation Patient Details Name: LATYSHA GUERRETTE MRN: [id number] DOB: 1928/12/27 Today's Date: 07/31/2015    History of Present Illness CASONDRA VANDERHYDE is a very pleasant, but unfortunate 81 y.o. female with PMH of hypertension, hyperlipidemia, chronic kidney disease stage III-IV, and recent diagnosis of pancreatic adenocarcinoma who presents  07/28/15 with several weeks of progressive generalized weakness, polyuria, and polydipsia. Initial blood work revealed serum glucose of 850    Clinical Impression   Pt admitted with generalized weakness. Pt currently with functional limitations due to the deficits listed below (see OT Problem List).  Pt will benefit from skilled OT to increase their safety and independence with ADL and functional mobility for ADL to facilitate discharge to venue listed below.      Follow Up Recommendations  SNF    Equipment Recommendations  None recommended by OT           Mobility Bed Mobility               General bed mobility comments: pt in chair  Transfers Overall transfer level: Needs assistance Equipment used: Rolling walker (2 wheeled);None Transfers: Sit to/from [employer] to Stand: Min assist Stand pivot transfers: Min assist            Balance     Sitting balance-Leahy Scale: Good Sitting balance - Comments: leans over and washes legs while sitting on toilet.     Standing balance-Leahy Scale: Fair                              ADL Overall ADL's : Needs assistance/impaired Eating/Feeding: Set up;Sitting   Grooming: Minimal assistance;Sitting   Upper Body Bathing: Minimal assitance;Sitting   Lower Body Bathing: Moderate assistance;Sit to/from stand   Upper Body Dressing : Minimal assistance;Sitting   Lower Body Dressing: Moderate assistance;Sit to/from stand         Toileting - Clothing Manipulation Details (indicate cue type and reason): pt reports going to  bathroom but declined at this time       General ADL Comments: pt and family agreeable to SNF.                      Extremity/Trunk Assessment         Cervical / Trunk Assessment Cervical / Trunk Assessment: Normal   Communication Communication Communication: No difficulties   Cognition Arousal/Alertness: Awake/alert Behavior During Therapy: WFL for tasks assessed/performed Overall Cognitive Status: Within Functional Limits for tasks assessed                                Home Living Family/patient expects to be discharged to:: Private residence Living Arrangements: Alone Available Help at Discharge: Family;Available PRN/intermittently Type of Home: Apartment Home Access: Level entry     Home Layout: One level     Bathroom Shower/Tub: Teacher, early years/pre: Handicapped height     Home Equipment: Tub bench (3 wheeled RW)   Additional Comments: has transportation via TEFL teacher from complex at [employer].      Prior Functioning/Environment Level of Independence: Independent with assistive device(s)             OT Diagnosis: Generalized weakness   OT Problem List: Decreased strength;Decreased activity tolerance   OT Treatment/Interventions: Self-care/ADL training;DME and/or AE instruction;Patient/family education    OT Goals(Current goals  can be found in the care plan section) Acute Rehab OT Goals Patient Stated Goal: to go home, walk more OT Goal Formulation: With patient Time For Goal Achievement: 08/14/15 Potential to Achieve Goals: Good  OT Frequency: Min 2X/week   Barriers to D/C: Decreased caregiver support             End of Session Nurse Communication: Mobility status  Activity Tolerance: Patient tolerated treatment well Patient left: in chair;with call bell/phone within reach;with chair alarm set   Time: [id number] OT Time Calculation (min): 17 min Charges:  OT General Charges $OT  Visit: 1 Procedure OT Evaluation $Initial OT Evaluation Tier I: 1 Procedure G-Codes:    Betsy Pries 02-Aug-2015, 3:46 PM

## 2015-08-01 DIAGNOSIS — E1101 Type 2 diabetes mellitus with hyperosmolarity with coma: Secondary | ICD-10-CM | POA: Diagnosis not present

## 2015-08-01 DIAGNOSIS — C259 Malignant neoplasm of pancreas, unspecified: Secondary | ICD-10-CM | POA: Diagnosis not present

## 2015-08-01 DIAGNOSIS — E088 Diabetes mellitus due to underlying condition with unspecified complications: Secondary | ICD-10-CM | POA: Diagnosis not present

## 2015-08-01 DIAGNOSIS — E119 Type 2 diabetes mellitus without complications: Secondary | ICD-10-CM | POA: Diagnosis not present

## 2015-08-01 DIAGNOSIS — E860 Dehydration: Secondary | ICD-10-CM | POA: Diagnosis not present

## 2015-08-01 DIAGNOSIS — C250 Malignant neoplasm of head of pancreas: Secondary | ICD-10-CM | POA: Diagnosis not present

## 2015-08-01 DIAGNOSIS — E785 Hyperlipidemia, unspecified: Secondary | ICD-10-CM | POA: Diagnosis not present

## 2015-08-01 DIAGNOSIS — R739 Hyperglycemia, unspecified: Secondary | ICD-10-CM | POA: Diagnosis not present

## 2015-08-01 DIAGNOSIS — E0800 Diabetes mellitus due to underlying condition with hyperosmolarity without nonketotic hyperglycemic-hyperosmolar coma (NKHHC): Secondary | ICD-10-CM | POA: Diagnosis not present

## 2015-08-01 DIAGNOSIS — J301 Allergic rhinitis due to pollen: Secondary | ICD-10-CM | POA: Diagnosis not present

## 2015-08-01 DIAGNOSIS — K219 Gastro-esophageal reflux disease without esophagitis: Secondary | ICD-10-CM | POA: Diagnosis not present

## 2015-08-01 DIAGNOSIS — N189 Chronic kidney disease, unspecified: Secondary | ICD-10-CM | POA: Diagnosis not present

## 2015-08-01 DIAGNOSIS — Z794 Long term (current) use of insulin: Secondary | ICD-10-CM | POA: Diagnosis not present

## 2015-08-01 DIAGNOSIS — K831 Obstruction of bile duct: Secondary | ICD-10-CM | POA: Diagnosis not present

## 2015-08-01 DIAGNOSIS — N183 Chronic kidney disease, stage 3 (moderate): Secondary | ICD-10-CM | POA: Diagnosis not present

## 2015-08-01 DIAGNOSIS — F329 Major depressive disorder, single episode, unspecified: Secondary | ICD-10-CM | POA: Diagnosis not present

## 2015-08-01 DIAGNOSIS — E872 Acidosis, unspecified: Secondary | ICD-10-CM | POA: Insufficient documentation

## 2015-08-01 DIAGNOSIS — E876 Hypokalemia: Secondary | ICD-10-CM | POA: Diagnosis not present

## 2015-08-01 DIAGNOSIS — I10 Essential (primary) hypertension: Secondary | ICD-10-CM | POA: Diagnosis not present

## 2015-08-01 DIAGNOSIS — F339 Major depressive disorder, recurrent, unspecified: Secondary | ICD-10-CM | POA: Diagnosis not present

## 2015-08-01 LAB — GLUCOSE, CAPILLARY
GLUCOSE-CAPILLARY: 105 mg/dL — AB (ref 65–99)
GLUCOSE-CAPILLARY: 84 mg/dL (ref 65–99)
Glucose-Capillary: 129 mg/dL — ABNORMAL HIGH (ref 65–99)

## 2015-08-01 LAB — BASIC METABOLIC PANEL
Anion gap: 6 (ref 5–15)
BUN: 11 mg/dL (ref 6–20)
CHLORIDE: 114 mmol/L — AB (ref 101–111)
CO2: 17 mmol/L — ABNORMAL LOW (ref 22–32)
CREATININE: 0.98 mg/dL (ref 0.44–1.00)
Calcium: 7.6 mg/dL — ABNORMAL LOW (ref 8.9–10.3)
GFR calc Af Amer: 60 mL/min — ABNORMAL LOW (ref >60)
GFR, EST NON AFRICAN AMERICAN: 52 mL/min — AB (ref >60)
GLUCOSE: 156 mg/dL — AB (ref 65–99)
POTASSIUM: 3.9 mmol/L (ref 3.5–5.1)
Sodium: 137 mmol/L (ref 135–145)

## 2015-08-01 MED ORDER — POLYETHYLENE GLYCOL 3350 17 GM/SCOOP PO POWD: 17.0000 g | Freq: Every day | ORAL | Status: DC | PRN

## 2015-08-01 MED ORDER — INSULIN ASPART 100 UNIT/ML ~~LOC~~ SOLN: 6.0000 [IU] | Freq: Three times a day (TID) | SUBCUTANEOUS | Status: DC

## 2015-08-01 MED ORDER — INSULIN GLARGINE 100 UNIT/ML ~~LOC~~ SOLN: 18.0000 [IU] | Freq: Every day | SUBCUTANEOUS | Status: DC

## 2015-08-01 MED ORDER — SENNOSIDES-DOCUSATE SODIUM 8.6-50 MG PO TABS: 1.0000 | ORAL_TABLET | Freq: Every evening | ORAL | Status: DC | PRN

## 2015-08-01 MED ORDER — LORAZEPAM 0.5 MG PO TABS: 0.5000 mg | ORAL_TABLET | Freq: Every day | ORAL | Status: DC | PRN

## 2015-08-01 NOTE — Telephone Encounter (Signed)
Admitted 07-28-2015, DKA.

## 2015-08-01 NOTE — Progress Notes (Signed)
CSW continuing to follow.  CSW followed up with pt and pt brother and sister-in-law at bedside this morning to discuss SNF bed offers.  Pt chooses bed at [facility].  CSW notified [facility] of acceptance of bed offer and facility confirmed that facility could accept pt today.  MD confirmed pt ready for discharge today.  CSW facilitated pt discharge needs including contacting facility, faxing pt discharge information via [place], discussing with pt at bedside, providing RN phone number to call report, and arranging ambulance transport via [facility] for pt to [facility].  Pt coping appropriately with plan to transition to [facility] today.  No further social work needs identified at this time.  CSW signing off.   Alison Murray, MSW, [place] Work [phone]

## 2015-08-01 NOTE — Progress Notes (Signed)
Occupational Therapy Treatment Patient Details Name: JOURNEE RAYA MRN: [id number] DOB: August 16, 1928 Today's Date: 08/01/2015    History of present illness JANISHA ALMAGER is a very pleasant, but unfortunate 81 y.o. female with PMH of hypertension, hyperlipidemia, chronic kidney disease stage III-IV, and recent diagnosis of pancreatic adenocarcinoma who presented 07/28/15 with several weeks of progressive generalized weakness, polyuria   OT comments  Tolerated session well.  Pt is looking forward to going to [facility] for rehab  Follow Up Recommendations  SNF    Equipment Recommendations  None recommended by OT    Recommendations for Other Services      Precautions / Restrictions Precautions Precautions: Fall Restrictions Weight Bearing Restrictions: No       Mobility Bed Mobility Overal bed mobility: Independent                Transfers   Equipment used: 1 person hand held assist   Sit to Stand: Min assist         General transfer comment: Pt uses RW when walking distances:  requested hand held assist to ambulate 5 feet to sink    Balance                                   ADL       Grooming: Oral care;Min guard;Standing                                 General ADL Comments: ambulated to sink with min guard and cleaned teeth:  Sat in chair at end of session.  Pt had used commode and washed up prior to OT's arrival.       Vision                     Perception     Praxis      Cognition   Behavior During Therapy: [facility] for tasks assessed/performed Overall Cognitive Status: Within Functional Limits for tasks assessed                       Extremity/Trunk Assessment               Exercises     Shoulder Instructions       General Comments      Pertinent Vitals/ Pain       Pain Assessment: No/denies pain  Home Living                                          Prior  Functioning/Environment              Frequency Min 2X/week     Progress Toward Goals  OT Goals(current goals can now be found in the care plan section)  Progress towards OT goals: Progressing toward goals     Plan Discharge plan remains appropriate    Co-evaluation                 End of Session     Activity Tolerance Patient tolerated treatment well   Patient Left in chair;with call bell/phone within reach;with chair alarm set   Nurse Communication          Time: [id number] OT Time Calculation (min): 17  min  Charges: OT General Charges $OT Visit: 1 Procedure OT Treatments $Self Care/Home Management : 8-22 mins  Doyce Stonehouse 08/01/2015, 11:20 AM  Lesle Chris, OTR/L [phone] 08/01/2015

## 2015-08-01 NOTE — Clinical Social Work Placement (Signed)
   CLINICAL SOCIAL WORK PLACEMENT  NOTE  Date:  08/01/2015  Patient Details  Name: [name] MRN: [id number] Date of Birth: Feb 12, 1929  Clinical Social Work is seeking post-discharge placement for this patient at the [place] level of care (*CSW will initial, date and re-position this form in  chart as items are completed):  Yes   Patient/family provided with [place] Work Department's list of facilities offering this level of care within the geographic area requested by the patient (or if unable, by the patient's family).  Yes   Patient/family informed of their freedom to choose among providers that offer the needed level of care, that participate in Medicare, Medicaid or managed care program needed by the patient, have an available bed and are willing to accept the patient.  Yes   Patient/family informed of 's ownership interest in [facility] and [facility], as well as of the fact that they are under no obligation to receive care at these facilities.  PASRR submitted to EDS on       PASRR number received on       Existing PASRR number confirmed on 07/31/15     FL2 transmitted to all facilities in geographic area requested by pt/family on 07/31/15     FL2 transmitted to all facilities within larger geographic area on       Patient informed that his/her managed care company has contracts with or will negotiate with certain facilities, including the following:        Yes   Patient/family informed of bed offers received.  Patient chooses bed at [place]     Physician recommends and patient chooses bed at      Patient to be transferred to [facility] on 08/01/15.  Patient to be transferred to facility by ambulance [facility]y [facility])     Patient family notified on 08/01/15 of transfer.  Name of family member notified:  pt notified at bedside     PHYSICIAN Please sign DNR, Please sign  FL2     Additional Comment:    _______________________________________________ Ladell Pier, LCSW 08/01/2015, 12:43 PM

## 2015-08-01 NOTE — Discharge Summary (Addendum)
Physician Discharge Summary  Linda Bentley [id number] DOB: 1928-12-28 DOA: 07/28/2015  PCP: Drema Pry, DO  Admit date: 07/28/2015 Discharge date: 08/01/2015  Time spent: 35 minutes  Recommendations for Outpatient Follow-up:  1. Discharged to skilled nursing facility 2. Follow up with oncologist has scheduled. 3. Please follow urinalysis as outpatient. (Patient complaining of increased urinary  frequency)   Discharge Diagnoses:  Principal Problem:   HHNC (hyperglycemic hyperosmolar nonketotic coma) ([place])   Active Problems:   Chronic renal insufficiency   Pancreatic cancer (HCC)   Dehydration   Secondary DM with DKA, uncontrolled ([place])   Hyperosmolarity due to secondary diabetes ([place])   Metabolic acidosis Hypokalemia  Discharge Condition: Fair  Diet recommendation: Diabetic  CODE STATUS: DO NOT RESUSCITATE  Filed Weights   07/28/15 2100 07/29/15 0400  Weight: 78 kg (171 lb 15.3 oz) 81.8 kg (180 lb 5.4 oz)    History of present illness:  Please refer to admission H&P for details, in brief, 81 year old female with history of hypertension, hyperlipidemia, CK D stage III-4 with recently diagnosed pancreatic adenocarcinoma resented with several days of progressive weakness, polyuria and polydipsia. Her blood glucose was found to be 345 at the cancer center 10 days back. Patient has been on Decadron as well. In the ED she was found to be afebrile with normal vitals. Blood work showed serum glucose of 850 with back of 21 and anion gap of 17, positive urine ketones. She was given 2 L normal saline bolus and admitted to hospitalist service for new onset diabetes with hyperosmolar nonketotic state.Marland Kitchen  Hospital Course:  Hyperosmolar hyperglycemic nonketotic state secondary to new onset diabetes Diabetes possibly secondary to pancreatic adenocarcinoma. Hemoglobin A1c was 5.9 on 03/13/2015 and is 10.7 this admission. She was started on IV insulin therapy and further adjusted with  18 units of Lantus at night and 6 units of aspart for meal coverage. Her fingersticks have been stable in the range of 120-200. Decadron has been discontinued. -Metabolic acidosis and anion gap has now resolved. -Diabetes meal planning , blood glucose monitoring and insulin teaching provided while in the hospital which needs to be continued at the facility.  Chronic kidney disease stage III-IV Baseline creatinine of 1.55. Had some worsening renal function on presentation secondary to dehydration and has now improved.  Pancreatic adenocarcinoma (pancreatic head) Recently diagnosed. Underwent ERCP on 06/22/2015. PET scan suggests disease localized to the pancreatic head. Patient has declined surgery and opted for chemotherapy only. Continue Creon. Decadron has been discontinued. She will follow-up with her oncologist Dr. Irene Limbo as outpatient.  Essential hypertension On labetalol at home. Required as needed IV hydralazine while in the hospital. Blood pressure remains stable currently.  Hypokalemia Secondary to dehydration. Replenished.  Increase urinary frequency on 12/27 Denies dysuria. Order for UA which can be followed as outpatient.  Constipation  Patient is on bowel regimen and reports having uncontrolled loose bowel movements with scheduled medications. I have changed them to as needed.  Patient seen by physical therapy and recommend short-term skilled nursing facility to gain more independence she lives home alone..  Patient is stable to be discharged to skilled nursing facility.  Procedures:  None  Consultations:  None  Antibiotics: None  Family communication: Brother and sister at bedside   Discharge Exam: Filed Vitals:   07/31/15 2120 08/01/15 0542  BP: 123/51 135/59  Pulse: 77 93  Temp: 97.7 F (36.5 C) 99.4 F (37.4 C)  Resp: 16 16    General: Elderly female not in distress  HEENT: No pallor, moist oral mucosa, supple neck Chest: Clear to auscultation  bilaterally CVS: Normal S1 and S2, no murmurs rub or gallop GI: Soft, nondistended, nontender, bowel sounds present Musculoskeletal: Warm, no edema CNS: Alert and oriented  Discharge Instructions    Current Discharge Medication List    START taking these medications   Details  insulin aspart (NOVOLOG) 100 UNIT/ML injection Inject 6 Units into the skin 3 (three) times daily with meals. Qty: 10 mL, Refills: 11    insulin glargine (LANTUS) 100 UNIT/ML injection Inject 0.18 mLs (18 Units total) into the skin at bedtime. Qty: 10 mL, Refills: 11      CONTINUE these medications which have CHANGED   Details  LORazepam (ATIVAN) 0.5 MG tablet Take 1 tablet (0.5 mg total) by mouth daily as needed. for anxiety Qty: 30 tablet, Refills: 3    polyethylene glycol powder (GLYCOLAX/MIRALAX) powder Take 17 g by mouth daily as needed for severe constipation. Qty: 3350 g, Refills: 3    senna-docusate (SENNA S) 8.6-50 MG tablet Take 1 tablet by mouth at bedtime as needed for mild constipation. Qty: 20 tablet, Refills: 0      CONTINUE these medications which have NOT CHANGED   Details  atorvastatin (LIPITOR) 40 MG tablet Take 1 tablet (40 mg total) by mouth daily. Qty: 90 tablet, Refills: 1    calcium carbonate (TUMS - DOSED IN MG ELEMENTAL CALCIUM) 500 MG chewable tablet Chew 1-2 tablets by mouth daily as needed for indigestion or heartburn.     citalopram (CELEXA) 20 MG tablet TAKE 1 TABLET BY MOUTH DAILY. Qty: 90 tablet, Refills: 1    fluticasone (FLONASE) 50 MCG/ACT nasal spray Place 2 sprays into both nostrils daily. Qty: 16 g, Refills: 1    labetalol (NORMODYNE) 100 MG tablet TAKE 1 TABLET BY MOUTH DAILY. Qty: 90 tablet, Refills: 2    lipase/protease/amylase (CREON) 12000 UNITS CPEP capsule Take 1 capsule (12,000 Units total) by mouth 3 (three) times daily before meals. Qty: 270 capsule, Refills: 1    omeprazole (PRILOSEC) 40 MG capsule TAKE 1 CAPSULE BY MOUTH DAILY. Qty: 90  capsule, Refills: 1    oxybutynin (DITROPAN XL) 15 MG 24 hr tablet Take 15 mg by mouth at bedtime.      STOP taking these medications     dexamethasone (DECADRON) 2 MG tablet        Allergies  Allergen Reactions  . Diltiazem Hcl Other (See Comments)    REACTION: feeling drunk   Follow-up Information    Please follow up.   Why:  MD at SNF in 1 week       The results of significant diagnostics from this hospitalization (including imaging, microbiology, ancillary and laboratory) are listed below for reference.    Significant Diagnostic Studies: Nm Pet Image Initial (pi) Skull Base To Thigh  07/13/2015  CLINICAL DATA:  Initial treatment strategy for pancreatic cancer. Initial staging examination. EXAM: NUCLEAR MEDICINE PET SKULL BASE TO THIGH TECHNIQUE: 10.5 mCi F-18 FDG was injected intravenously. Full-ring PET imaging was performed from the skull base to thigh after the radiotracer. CT data was obtained and used for attenuation correction and anatomic localization. FASTING BLOOD GLUCOSE:  Value: 231 mg/dl COMPARISON:  CT of the abdomen pelvis 06/20/2015. FINDINGS: NECK No hypermetabolic lymph nodes in the neck. CHEST No hypermetabolic mediastinal or hilar nodes. No suspicious pulmonary nodules on the CT scan. Heart size is mildly enlarged. There is no significant pericardial fluid, thickening or pericardial calcification. There is  atherosclerosis of the thoracic aorta, the great vessels of the mediastinum and the coronary arteries, including calcified atherosclerotic plaque in the left main, left anterior descending, left circumflex and right coronary arteries. Mild calcifications of the aortic valve and mitral annulus. Dilatation of the pulmonic trunk (3.8 cm in diameter). ABDOMEN/PELVIS Compared to the prior examination there has been interval placement of a metallic stent in the common bile duct. Pneumobilia now noted throughout the intrahepatic biliary tree. Previously noted pancreatic  mass in the superior aspect of the pancreatic head is again noted, estimated to measure approximately 2.3 x 3.3 cm (image 119 of series 5), and is diffusely hypermetabolic (SUVmax = 7.6). The body and tail of the pancreas are atrophic. No abnormal hypermetabolic activity within the liver, adrenal glands, or spleen. No hypermetabolic lymph nodes in the abdomen or pelvis. Exophytic 1.9 cm low-attenuation lesion in the posterior aspect of the upper pole of the right kidney is incompletely characterized on today's noncontrast CT examination, but demonstrates no hypermetabolism, presumably a small cyst. No significant volume of ascites. No pneumoperitoneum. Small umbilical hernia containing only omental fat. SKELETON No focal hypermetabolic activity to suggest skeletal metastasis. IMPRESSION: 1. 2.3 x 3.3 cm hypermetabolic mass in the head of the pancreas compatible with the biopsy-proven adenocarcinoma. No definite evidence of metastatic disease in the neck, chest, abdomen or pelvis. 2. Interval placement of a common bile duct stent, with new pneumobilia in the liver. 3. Cardiomegaly. 4. Dilatation of the pulmonic trunk (3.8 cm), suggesting pulmonary arterial hypertension. 5. Atherosclerosis, including left main and 3 vessel coronary artery disease. 6. Additional incidental findings, as above. Electronically Signed   By: Vinnie Langton M.D.   On: 07/13/2015 16:13    Microbiology: Recent Results (from the past 240 hour(s))  MRSA PCR Screening     Status: None   Collection Time: 07/28/15  9:04 PM  Result Value Ref Range Status   MRSA by PCR NEGATIVE NEGATIVE Final    Comment:        The GeneXpert MRSA Assay (FDA approved for NASAL specimens only), is one component of a comprehensive MRSA colonization surveillance program. It is not intended to diagnose MRSA infection nor to guide or monitor treatment for MRSA infections.      Labs: Basic Metabolic Panel:  Recent Labs Lab 07/28/15 1959   07/29/15 1708 07/30/15 0450 07/30/15 1704 07/31/15 0444 08/01/15 0532  NA 137  < > 141 140 139 139 137  K 5.2*  < > 3.6 3.7 3.8 3.4* 3.9  CL 98*  < > 111 111 110 113* 114*  CO2 23  < > 23 21* 20* 17* 17*  GLUCOSE 751*  < > 168* 219* 245* 168* 156*  BUN 50*  < > 31* 25* 20 16 11   CREATININE 1.82*  < > 1.26* 1.19* 1.14* 1.04* 0.98  CALCIUM 10.0  < > 8.4* 8.1* 8.1* 8.0* 7.6*  MG 2.5*  --   --   --   --   --   --   PHOS 3.9  --   --   --   --   --   --   < > = values in this interval not displayed. Liver Function Tests: No results for input(s): AST, ALT, ALKPHOS, BILITOT, PROT, ALBUMIN in the last 168 hours. No results for input(s): LIPASE, AMYLASE in the last 168 hours. No results for input(s): AMMONIA in the last 168 hours. CBC:  Recent Labs Lab 07/28/15 1655  WBC 7.9  HGB  12.3  HCT 36.8  MCV 89.5  PLT 162   Cardiac Enzymes:  Recent Labs Lab 07/28/15 1959  TROPONINI 0.05*   BNP: BNP (last 3 results) No results for input(s): BNP in the last 8760 hours.  ProBNP (last 3 results) No results for input(s): PROBNP in the last 8760 hours.  CBG:  Recent Labs Lab 07/30/15 2206 07/31/15 0738 07/31/15 1140 07/31/15 2205 08/01/15 0743  GLUCAP 142* 162* 220* 207* 129*       Signed:  Louellen Molder MD   [facility] 08/01/2015, 10:50 AM

## 2015-08-01 NOTE — Telephone Encounter (Signed)
Pt seen in ED and admitted to the hospital.

## 2015-08-01 NOTE — Progress Notes (Signed)
Inpatient Diabetes Program Recommendations  AACE/ADA: New Consensus Statement on Inpatient Glycemic Control (2015)  Target Ranges:  Prepandial:   less than 140 mg/dL      Peak postprandial:   less than 180 mg/dL (1-2 hours)      Critically ill patients:  140 - 180 mg/dL    Results for CHELAN, HERINGER (MRN [id number]) as of 08/01/2015 14:44  Ref. Range 08/01/2015 07:43 08/01/2015 11:24  Glucose-Capillary Latest Ref Range: 65-99 mg/dL 129 (H) 84    Admit with: Hyperglycemia- New diagnosis of DM  History: CKD4, Pancreatic Cancer (recent diagnosis- takes Decadron at home)  Current Insulin Orders: Lantus 18 units QHS  Novolog Moderate SSI (0-15 units) TID AC  Novolog 6 units tidwc     -RNs providing basic DM education with patient at bedside.  -CBG meter teaching started by RNs as well per patient.  -Spoke with pt about new diagnosis.  Discussed A1C results with her and explained what an A1C is, basic pathophysiology of DM Type 2, basic home care, basic diabetes diet nutrition principles, importance of checking CBGs and maintaining good CBG control to prevent long-term and short-term complications.    -RNs to provide ongoing basic DM education at bedside with this patient.  Have ordered educational booklet, insulin starter kit, and DM videos.  Have also placed RD consult for DM diet education for this patient.  -Note patient to [facility] after d/c from hospital.  Discussed with patient that we will be discharging her to the SNF with insulin prescriptions and that she will be getting insulin and blood sugar checks at the SNF.  Not sure if the MD from the SNF will send patient home after Rehab with insulin.  Patient had limited insulin training here in the hospital.  Asked patient to request further insulin teaching follow-up with the RNs at the SNF in case she is discharged home from the SNF with  insulin.  Patient very attentive and agreeable.    --Will follow patient during hospitalization--  Wyn Quaker RN, MSN, CDE Diabetes Coordinator Inpatient Glycemic Control Team Team Pager: [phone] (8a-5p)

## 2015-08-01 NOTE — Discharge Instructions (Signed)
Blood Glucose Monitoring, Adult ° °Monitoring your blood glucose (also know as blood sugar) helps you to manage your diabetes. It also helps you and your health care provider monitor your diabetes and determine how well your treatment plan is working. °WHY SHOULD YOU MONITOR YOUR BLOOD GLUCOSE? °· It can help you understand how food, exercise, and medicine affect your blood glucose. °· It allows you to know what your blood glucose is at any given moment. You can quickly tell if you are having low blood glucose (hypoglycemia) or high blood glucose (hyperglycemia). °· It can help you and your health care provider know how to adjust your medicines. °· It can help you understand how to manage an illness or adjust medicine for exercise. °WHEN SHOULD YOU TEST? °Your health care provider will help you decide how often you should check your blood glucose. This may depend on the type of diabetes you have, your diabetes control, or the types of medicines you are taking. Be sure to write down all of your blood glucose readings so that this information can be reviewed with your health care provider. See below for examples of testing times that your health care provider may suggest. °Type 1 Diabetes °· Test at least 2 times per day if your diabetes is well controlled, if you are using an insulin pump, or if you perform multiple daily injections. °· If your diabetes is not well controlled or if you are sick, you may need to test more often. °· It is a good idea to also test: °¨ Before every insulin injection. °¨ Before and after exercise. °¨ Between meals and 2 hours after a meal. °¨ Occasionally between 2:00 a.m. and 3:00 a.m. °Type 2 Diabetes °· If you are taking insulin, test at least 2 times per day. However, it is best to test before every insulin injection. °· If you take medicines by mouth (orally), test 2 times a day. °· If you are on a controlled diet, test once a day. °· If your diabetes is not well controlled or if you  are sick, you may need to monitor more often. °HOW TO MONITOR YOUR BLOOD GLUCOSE °Supplies Needed °· Blood glucose meter. °· Test strips for your meter. Each meter has its own strips. You must use the strips that go with your own meter. °· A pricking needle (lancet). °· A device that holds the lancet (lancing device). °· A journal or log book to write down your results. °Procedure °· Wash your hands with soap and water. Alcohol is not preferred. °· Prick the side of your finger (not the tip) with the lancet. °· Gently milk the finger until a small drop of blood appears. °· Follow the instructions that come with your meter for inserting the test strip, applying blood to the strip, and using your blood glucose meter. °Other Areas to Get Blood for Testing °Some meters allow you to use other areas of your body (other than your finger) to test your blood. These areas are called alternative sites. The most common alternative sites are: °· The forearm. °· The thigh. °· The back area of the lower leg. °· The palm of the hand. °The blood flow in these areas is slower. Therefore, the blood glucose values you get may be delayed, and the numbers are different from what you would get from your fingers. Do not use alternative sites if you think you are having hypoglycemia. Your reading will not be accurate. Always use a finger if you   are having hypoglycemia. Also, if you cannot feel your lows (hypoglycemia unawareness), always use your fingers for your blood glucose checks. °ADDITIONAL TIPS FOR GLUCOSE MONITORING °· Do not reuse lancets. °· Always carry your supplies with you. °· All blood glucose meters have a 24-hour "hotline" number to call if you have questions or need help. °· Adjust (calibrate) your blood glucose meter with a control solution after finishing a few boxes of strips. °BLOOD GLUCOSE RECORD KEEPING °It is a good idea to keep a daily record or log of your blood glucose readings. Most glucose meters, if not all,  keep your glucose records stored in the meter. Some meters come with the ability to download your records to your home computer. Keeping a record of your blood glucose readings is especially helpful if you are wanting to look for patterns. Make notes to go along with the blood glucose readings because you might forget what happened at that exact time. Keeping good records helps you and your health care provider to work together to achieve good diabetes management.  °  °This information is not intended to replace advice given to you by your health care provider. Make sure you discuss any questions you have with your health care provider. °  °Document Released: 07/25/2003 Document Revised: 08/12/2014 Document Reviewed: 12/14/2012 °Elsevier Interactive Patient Education ©2016 [employer] ° °

## 2015-08-02 ENCOUNTER — Telehealth: Payer: Self-pay | Admitting: *Deleted

## 2015-08-02 NOTE — Telephone Encounter (Signed)
  Oncology Nurse Navigator Documentation    Navigator Encounter Type: Telephone (08/02/15 1409): No answer at patient's home. Called her brother, Hope Budds who said she was discharged from hospital and is currently at [facility] [address] for rehab. Confirms they will see Dr. Irene Limbo on 08/08/15, but can't come on Friday to see Dr. Barry Dienes. Dr. Barry Dienes was going to see her on Friday during [place].         Interventions: Coordination of Care (08/02/15 1409) : He needs to be present for all her appointments, so Wed/Fri are off. Can come Monday before 12 noon and a Tues/Thurs anytime. Forwarded this information to Dr. Marlowe Aschoff office to assist with her reschedule.

## 2015-08-08 ENCOUNTER — Telehealth: Payer: Self-pay | Admitting: *Deleted

## 2015-08-08 ENCOUNTER — Telehealth: Payer: Self-pay | Admitting: Hematology

## 2015-08-08 ENCOUNTER — Ambulatory Visit (HOSPITAL_BASED_OUTPATIENT_CLINIC_OR_DEPARTMENT_OTHER): Payer: Medicare Other | Admitting: Hematology

## 2015-08-08 ENCOUNTER — Encounter: Payer: Self-pay | Admitting: Hematology

## 2015-08-08 VITALS — BP 115/53 | HR 72 | Temp 98.1°F | Resp 18 | Ht 61.0 in | Wt 189.0 lb

## 2015-08-08 DIAGNOSIS — C250 Malignant neoplasm of head of pancreas: Secondary | ICD-10-CM

## 2015-08-08 DIAGNOSIS — C259 Malignant neoplasm of pancreas, unspecified: Secondary | ICD-10-CM | POA: Diagnosis not present

## 2015-08-08 DIAGNOSIS — E119 Type 2 diabetes mellitus without complications: Secondary | ICD-10-CM | POA: Diagnosis not present

## 2015-08-08 DIAGNOSIS — Z794 Long term (current) use of insulin: Secondary | ICD-10-CM | POA: Diagnosis not present

## 2015-08-08 NOTE — Telephone Encounter (Signed)
Talked to patient while she was here in the office. Scheduled follow ups, port placement, and chemo edu class. Sent message to infusion to schedule chemo starting 08/16/2015.       [facility].

## 2015-08-08 NOTE — Telephone Encounter (Signed)
Per staff message and POF I have scheduled appts. Advised scheduler of appts. JMW  

## 2015-08-10 ENCOUNTER — Other Ambulatory Visit: Payer: Medicare Other

## 2015-08-10 ENCOUNTER — Encounter: Payer: Self-pay | Admitting: *Deleted

## 2015-08-10 NOTE — Progress Notes (Signed)
Message sent to Dr. Irene Limbo that patient has decided not to do chemo treatment.  Patient requested for port to be cancelled also.   Will keep app't for f/u on 1/17.

## 2015-08-10 NOTE — Progress Notes (Signed)
Oncology Nurse Navigator Documentation  Oncology Nurse Navigator Flowsheets 08/10/2015  Navigator Location [facility]  Navigator Encounter Type -  Patient Visit Type -  Treatment Phase -  Barriers/Navigation Needs -  Education -  Interventions Coordination of Care  Referrals -  Coordination of Care Appts;Radiology--cancelled chemo and PAC with IR due to patient decision not to pursue treatment. Dr. Irene Limbo is aware  Education Method -  Support Groups/Services -  Acuity Level 1  Time Spent with Patient -

## 2015-08-15 ENCOUNTER — Other Ambulatory Visit (HOSPITAL_COMMUNITY): Payer: Medicare Other

## 2015-08-15 ENCOUNTER — Ambulatory Visit (HOSPITAL_COMMUNITY): Payer: Medicare Other

## 2015-08-16 ENCOUNTER — Ambulatory Visit: Payer: Medicare Other

## 2015-08-17 ENCOUNTER — Non-Acute Institutional Stay (SKILLED_NURSING_FACILITY): Payer: Medicare Other | Admitting: Internal Medicine

## 2015-08-17 ENCOUNTER — Encounter: Payer: Self-pay | Admitting: Internal Medicine

## 2015-08-17 DIAGNOSIS — F329 Major depressive disorder, single episode, unspecified: Secondary | ICD-10-CM | POA: Diagnosis not present

## 2015-08-17 DIAGNOSIS — I10 Essential (primary) hypertension: Secondary | ICD-10-CM

## 2015-08-17 DIAGNOSIS — K219 Gastro-esophageal reflux disease without esophagitis: Secondary | ICD-10-CM

## 2015-08-17 DIAGNOSIS — E1101 Type 2 diabetes mellitus with hyperosmolarity with coma: Secondary | ICD-10-CM | POA: Diagnosis not present

## 2015-08-17 DIAGNOSIS — E1310 Other specified diabetes mellitus with ketoacidosis without coma: Secondary | ICD-10-CM

## 2015-08-17 DIAGNOSIS — C250 Malignant neoplasm of head of pancreas: Secondary | ICD-10-CM

## 2015-08-17 DIAGNOSIS — E785 Hyperlipidemia, unspecified: Secondary | ICD-10-CM

## 2015-08-17 DIAGNOSIS — F32A Depression, unspecified: Secondary | ICD-10-CM

## 2015-08-17 DIAGNOSIS — N183 Chronic kidney disease, stage 3 unspecified: Secondary | ICD-10-CM

## 2015-08-17 NOTE — Progress Notes (Signed)
MRN: [id number] Name: Linda Bentley  Sex: female Age: 82 y.o. DOB: Aug 28, 1928  [employer] #: Linda Bentley Facility/Room:101A Level Of Care: SNF Provider: Inocencio Homes D Emergency Contacts: Extended Emergency Contact Information Primary Emergency Contact: Thompson,Billy Address: [place], [place] [address] [place] Phone: [phone] Relation: Brother Secondary Emergency Contact: Thompson,Geneva  [place] Phone: [phone] Relation: Relative  Code Status:   Allergies: Diltiazem hcl  Chief Complaint  Patient presents with  . New Admit To SNF    HPI: Patient is 82 y.o. female whohypertension, hyperlipidemia, CK D stage III-4 with recently diagnosed pancreatic adenocarcinoma resented with several days of progressive weakness, polyuria and polydipsia. Her blood glucose was found to be 345 at the cancer center 10 days back. Patient has been on Decadron as well. In the ED she was found to be afebrile with normal vitals. Blood work showed serum glucose of 850 with back of 21 and anion gap of 17. She was admitted to [facility] from 12/23-27 for  new onset diabetes with hyperosmolar hyperglycemic nonketotic state..Pt is admitted to SNF for generalized weaknress. EWhile at [facility] pt will be followed for HTN, tx with labatolol, HLD, tx with lipitor and depression, tx with celexa.  Past Medical History  Diagnosis Date  . Depression   . GERD (gastroesophageal reflux disease)   . Hyperlipidemia   . Hypertension   . Osteopenia   . Renal insufficiency 2007    creat 1.3  . Hiatal hernia   . LBP (low back pain)   . UNSPECIFIED PERIPHERAL VASCULAR DISEASE 06/08/2010  . ANEMIA-UNSPECIFIED 07/03/2009  . HYPERGLYCEMIA 02/26/2007  . GERD 02/26/2007    Past Surgical History  Procedure Laterality Date  . Appendectomy  1962  . Cholecystectomy    . Oophorectomy  1962  . Parathyroidectomy    . Dilation and curettage of uterus  1962  . Wrist  fracture surgery  10/01  . Tail bone surgery    . Esophagogastroduodenoscopy      with dilatation Dr Fuller Plan  . Tonsillectomy and adenoidectomy    . Ingrown toenails       x3  . Ercp N/A 06/22/2015    Procedure: ENDOSCOPIC RETROGRADE CHOLANGIOPANCREATOGRAPHY (ERCP);  Surgeon: Ladene Artist, MD;  Location: [facility];  Service: Endoscopy;  Laterality: N/A;  . Eus N/A 07/27/2015    Procedure: UPPER ENDOSCOPIC ULTRASOUND (EUS) RADIAL;  Surgeon: [name], MD;  Location: [facility];  Service: Endoscopy;  Laterality: N/A;      Medication List       This list is accurate as of: 08/17/15 11:59 PM.  Always use your most recent med list.               atorvastatin 40 MG tablet  Commonly known as:  LIPITOR  Take 1 tablet (40 mg total) by mouth daily.     calcium carbonate 500 MG chewable tablet  Commonly known as:  TUMS - dosed in mg elemental calcium  Chew 1-2 tablets by mouth daily as needed for indigestion or heartburn.     citalopram 20 MG tablet  Commonly known as:  CELEXA  TAKE 1 TABLET BY MOUTH DAILY.     fluticasone 50 MCG/ACT nasal spray  Commonly known as:  FLONASE  Place 2 sprays into both nostrils daily.     insulin aspart 100 UNIT/ML injection  Commonly known as:  novoLOG  Inject 6 Units into the  skin 3 (three) times daily with meals.     insulin glargine 100 UNIT/ML injection  Commonly known as:  LANTUS  Inject 0.18 mLs (18 Units total) into the skin at bedtime.     labetalol 100 MG tablet  Commonly known as:  NORMODYNE  TAKE 1 TABLET BY MOUTH DAILY.     lipase/protease/amylase 12000 units Cpep capsule  Commonly known as:  CREON  Take 1 capsule (12,000 Units total) by mouth 3 (three) times daily before meals.     LORazepam 0.5 MG tablet  Commonly known as:  ATIVAN  Take 1 tablet (0.5 mg total) by mouth daily as needed. for anxiety     omeprazole 40 MG capsule  Commonly known as:  PRILOSEC  TAKE 1 CAPSULE BY MOUTH DAILY.     oxybutynin 15 MG  24 hr tablet  Commonly known as:  DITROPAN XL  Take 15 mg by mouth at bedtime.     polyethylene glycol powder powder  Commonly known as:  GLYCOLAX/MIRALAX  Take 17 g by mouth daily as needed for severe constipation.     senna-docusate 8.6-50 MG tablet  Commonly known as:  SENNA S  Take 1 tablet by mouth at bedtime as needed for mild constipation.        No orders of the defined types were placed in this encounter.    Immunization History  Administered Date(s) Administered  . H1N1 08/16/2008  . Influenza Whole 05/18/2007, 05/03/2008, 05/03/2009, 04/30/2010, 04/12/2011, 05/05/2012  . Influenza, High Dose Seasonal PF 07/11/2014  . Influenza-Unspecified 05/05/2013, 05/11/2015  . Pneumococcal Conjugate-13 10/11/2013  . Pneumococcal Polysaccharide-23 05/05/2004  . Tdap 01/12/2013  . Zoster 04/30/2010    Social History  Substance Use Topics  . Smoking status: Never Smoker   . Smokeless tobacco: Not on file  . Alcohol Use: No    Family history is +kidney disease, colon CA   Review of Systems  DATA OBTAINED: from patient, nurse GENERAL:  no fevers, fatigue, appetite changes SKIN: No itching, rash or wounds EYES: No eye pain, redness, discharge EARS: No earache, tinnitus, change in hearing NOSE: No congestion, drainage or bleeding  MOUTH/THROAT: No mouth or tooth pain, No sore throat RESPIRATORY: No cough, wheezing, SOB CARDIAC: No chest pain, palpitations, lower extremity edema  GI: No abdominal pain, No N/V/D or constipation, No heartburn or reflux  GU: No dysuria, frequency or urgency, or incontinence  MUSCULOSKELETAL: No unrelieved bone/joint pain NEUROLOGIC: No headache, dizziness or focal weakness PSYCHIATRIC: No c/o anxiety or sadness   Filed Vitals:   08/17/15 1439  BP: 108/58  Pulse: 81  Temp: 98.3 F (36.8 C)  Resp: 16    SpO2 Readings from Last 1 Encounters:  08/08/15 97%        Physical Exam  GENERAL APPEARANCE: Alert, conversant,  No acute  distress.  SKIN: No diaphoresis rash HEAD: Normocephalic, atraumatic  EYES: Conjunctiva/lids clear. Pupils round, reactive. EOMs intact.  EARS: External exam WNL, canals clear. Hearing grossly normal.  NOSE: No deformity or discharge.  MOUTH/THROAT: Lips w/o lesions  RESPIRATORY: Breathing is even, unlabored. Lung sounds are clear   CARDIOVASCULAR: Heart RRR no murmurs, rubs or gallops. No peripheral edema.   GASTROINTESTINAL: Abdomen is soft, non-tender, not distended w/ normal bowel sounds. GENITOURINARY: Bladder non tender, not distended  MUSCULOSKELETAL: No abnormal joints or musculature NEUROLOGIC:  Cranial nerves 2-12 grossly intact. Moves all extremities  PSYCHIATRIC: Mood and affect appropriate to situation, no behavioral issues  Patient Active Problem List  Diagnosis Date Noted  . Metabolic acidosis   . Hyperosmolarity due to secondary diabetes ([place]) 07/29/2015  . [place] (hyperglycemic hyperosmolar nonketotic coma) ([place]) 07/29/2015  . Secondary DM with DKA, uncontrolled ([place]) 07/28/2015  . Pancreatic cancer ([place]) 07/18/2015  . Dehydration 07/18/2015  . Common biliary duct obstruction 06/22/2015  . Abnormal CT scan, pancreas or bile duct 06/22/2015  . Obstructive jaundice 06/22/2015  . Elevated LFTs 06/22/2015  . Dysphagia 10/10/2014  . Headache 10/10/2014  . Counseling regarding end of life decision making 10/10/2014  . Preventative health care 07/11/2014  . Dizziness and giddiness 07/11/2014  . Anemia 01/25/2014  . Urinary incontinence 04/08/2012  . Back pain 04/01/2012  . Restless legs syndrome (RLS) 07/08/2011  . UNSPECIFIED PERIPHERAL VASCULAR DISEASE 06/08/2010  . OSTEOARTHRITIS, KNEE, RIGHT 12/28/2008  . OBESITY 04/14/2008  . Hyperlipidemia 02/26/2007  . Depression 02/26/2007  . Essential hypertension 02/26/2007  . GERD 02/26/2007  . CKD (chronic kidney disease) stage 3, GFR 30-59 ml/min 02/26/2007  . OSTEOPENIA 02/26/2007  . Prediabetes 02/26/2007     CBC    Component Value Date/Time   WBC 7.9 07/28/2015 1655   WBC 9.1 07/18/2015 1137   RBC 4.11 07/28/2015 1655   RBC 3.73 07/18/2015 1137   HGB 12.3 07/28/2015 1655   HGB 11.3* 07/18/2015 1137   HCT 36.8 07/28/2015 1655   HCT 34.3* 07/18/2015 1137   PLT 162 07/28/2015 1655   PLT 144* 07/18/2015 1137   MCV 89.5 07/28/2015 1655   MCV 92.0 07/18/2015 1137   LYMPHSABS 1.5 07/18/2015 1137   LYMPHSABS 2.1 06/19/2015 1518   MONOABS 0.5 07/18/2015 1137   MONOABS 0.4 06/19/2015 1518   EOSABS 0.5 07/18/2015 1137   EOSABS 0.3 06/19/2015 1518   BASOSABS 0.0 07/18/2015 1137   BASOSABS 0.0 06/19/2015 1518    CMP     Component Value Date/Time   NA 137 08/01/2015 0532   NA 135* 07/18/2015 1138   K 3.9 08/01/2015 0532   K 4.3 07/18/2015 1138   CL 114* 08/01/2015 0532   CO2 17* 08/01/2015 0532   CO2 24 07/18/2015 1138   GLUCOSE 156* 08/01/2015 0532   GLUCOSE 345* 07/18/2015 1138   GLUCOSE 105* 06/10/2006 1112   BUN 11 08/01/2015 0532   BUN 52.5* 07/18/2015 1138   CREATININE 0.98 08/01/2015 0532   CREATININE 2.4* 07/18/2015 1138   CALCIUM 7.6* 08/01/2015 0532   CALCIUM 9.5 07/18/2015 1138   PROT 6.9 07/18/2015 1138   PROT 7.0 06/19/2015 1518   ALBUMIN 3.7 07/18/2015 1138   ALBUMIN 4.1 06/19/2015 1518   AST 16 07/18/2015 1138   AST 231* 06/19/2015 1518   ALT 18 07/18/2015 1138   ALT 182* 06/19/2015 1518   ALKPHOS 173* 07/18/2015 1138   ALKPHOS 1152* 06/19/2015 1518   BILITOT 1.24* 07/18/2015 1138   BILITOT 4.2* 06/19/2015 1518   GFRNONAA 52* 08/01/2015 0532   GFRAA 60* 08/01/2015 0532    Lab Results  Component Value Date   HGBA1C 10.7* 07/28/2015     No results found.  Not all labs, radiology exams or other studies done during hospitalization come through on my EPIC note; however they are reviewed by me.    Assessment and Plan  HHNC (hyperglycemic hyperosmolar nonketotic coma) ([place]) Diabetes possibly secondary to pancreatic adenocarcinoma. Hemoglobin A1c  was 5.9 on 03/13/2015 and is 10.7 this admission. She was started on IV insulin therapy and further adjusted with 18 units of Lantus at night and 6 units of aspart for meal coverage.  Her fingersticks have been stable in the range of 120-200. Decadron has been discontinued. -Metabolic acidosis and anion gap has now resolved. -Diabetes meal planning , blood glucose monitoring and insulin teaching provided while in the hospital which needs to be continued at the facility. SNF - diabetic diet, lantus and novolog insulin; encourage fluid  CKD (chronic kidney disease) stage 3, GFR 30-59 ml/min Baseline creatinine of 1.55. Had some worsening renal function on presentation secondary to dehydration and has now improved.SNF - will do f/u BMP  Pancreatic cancer [facility]) Recently diagnosed. Underwent ERCP on 06/22/2015. PET scan suggests disease localized to the pancreatic head. Patient has declined surgery and opted for chemotherapy only. Continue Creon. Decadron has been discontinued. SNF- follow-up with her oncologist Dr. Irene Limbo as outpatient.   Essential hypertension SNF - controlled on labetolol;cont med  GERD SNF - cont protonix 40 mg daily  Hyperlipidemia SNF - not stated as uncontrolled, cont lipitor 40 mg  Secondary DM with DKA, uncontrolled ([place]) Diabetes possibly secondary to pancreatic adenocarcinoma. Hemoglobin A1c was 5.9 on 03/13/2015 and is 10.7 this admission. She was started on IV insulin therapy and further adjusted with 18 units of Lantus at night and 6 units of aspart for meal coverage SNF - cont lantus and novolog insulin;pt is on statin  Depression SNF - not stated as uncontrolled; cont celexa 20 mg daily   Time spent > 45 min;> 50% of time with patient was spent reviewing records, labs, tests and studies, counseling and developing plan of care  Hennie Duos, MD

## 2015-08-19 ENCOUNTER — Encounter: Payer: Self-pay | Admitting: Internal Medicine

## 2015-08-19 NOTE — Assessment & Plan Note (Signed)
SNF - not stated as uncontrolled, cont lipitor 40 mg

## 2015-08-19 NOTE — Assessment & Plan Note (Signed)
Baseline creatinine of 1.55. Had some worsening renal function on presentation secondary to dehydration and has now improved.SNF - will do f/u BMP

## 2015-08-19 NOTE — Assessment & Plan Note (Signed)
SNF - controlled on labetolol;cont med

## 2015-08-19 NOTE — Assessment & Plan Note (Signed)
Diabetes possibly secondary to pancreatic adenocarcinoma. Hemoglobin A1c was 5.9 on 03/13/2015 and is 10.7 this admission. She was started on IV insulin therapy and further adjusted with 18 units of Lantus at night and 6 units of aspart for meal coverage. Her fingersticks have been stable in the range of 120-200. Decadron has been discontinued. -Metabolic acidosis and anion gap has now resolved. -Diabetes meal planning , blood glucose monitoring and insulin teaching provided while in the hospital which needs to be continued at the facility. SNF - diabetic diet, lantus and novolog insulin; encourage fluid

## 2015-08-19 NOTE — Assessment & Plan Note (Signed)
SNF - not stated as uncontrolled; cont celexa 20 mg daily

## 2015-08-19 NOTE — Assessment & Plan Note (Signed)
Recently diagnosed. Underwent ERCP on 06/22/2015. PET scan suggests disease localized to the pancreatic head. Patient has declined surgery and opted for chemotherapy only. Continue Creon. Decadron has been discontinued. SNF- follow-up with her oncologist Dr. Irene Limbo as outpatient.

## 2015-08-19 NOTE — Assessment & Plan Note (Signed)
Diabetes possibly secondary to pancreatic adenocarcinoma. Hemoglobin A1c was 5.9 on 03/13/2015 and is 10.7 this admission. She was started on IV insulin therapy and further adjusted with 18 units of Lantus at night and 6 units of aspart for meal coverage SNF - cont lantus and novolog insulin;pt is on statin

## 2015-08-19 NOTE — Assessment & Plan Note (Signed)
SNF - cont protonix 40 mg daily 

## 2015-08-22 ENCOUNTER — Ambulatory Visit (HOSPITAL_BASED_OUTPATIENT_CLINIC_OR_DEPARTMENT_OTHER): Payer: Medicare Other | Admitting: Hematology

## 2015-08-22 ENCOUNTER — Encounter: Payer: Self-pay | Admitting: Hematology

## 2015-08-22 ENCOUNTER — Other Ambulatory Visit (HOSPITAL_BASED_OUTPATIENT_CLINIC_OR_DEPARTMENT_OTHER): Payer: Medicare Other

## 2015-08-22 VITALS — BP 123/59 | HR 80 | Temp 98.4°F | Resp 17 | Ht 61.0 in | Wt 188.7 lb

## 2015-08-22 DIAGNOSIS — C250 Malignant neoplasm of head of pancreas: Secondary | ICD-10-CM

## 2015-08-22 DIAGNOSIS — E119 Type 2 diabetes mellitus without complications: Secondary | ICD-10-CM | POA: Diagnosis not present

## 2015-08-22 DIAGNOSIS — N189 Chronic kidney disease, unspecified: Secondary | ICD-10-CM

## 2015-08-22 LAB — COMPREHENSIVE METABOLIC PANEL
AST: 13 U/L (ref 5–34)
Albumin: 3 g/dL — ABNORMAL LOW (ref 3.5–5.0)
Alkaline Phosphatase: 66 U/L (ref 40–150)
Anion Gap: 10 mEq/L (ref 3–11)
BUN: 18.8 mg/dL (ref 7.0–26.0)
CHLORIDE: 109 meq/L (ref 98–109)
CO2: 23 meq/L (ref 22–29)
CREATININE: 1.1 mg/dL (ref 0.6–1.1)
Calcium: 9.2 mg/dL (ref 8.4–10.4)
EGFR: 44 mL/min/{1.73_m2} — ABNORMAL LOW (ref >90)
Glucose: 149 mg/dl — ABNORMAL HIGH (ref 70–140)
Potassium: 4.7 mEq/L (ref 3.5–5.1)
SODIUM: 141 meq/L (ref 136–145)
Total Bilirubin: 0.61 mg/dL (ref 0.20–1.20)
Total Protein: 6.2 g/dL — ABNORMAL LOW (ref 6.4–8.3)

## 2015-08-22 LAB — CBC & DIFF AND RETIC
BASO%: 0.1 % (ref 0.0–2.0)
BASOS ABS: 0 10*3/uL (ref 0.0–0.1)
EOS ABS: 0.6 10*3/uL — AB (ref 0.0–0.5)
EOS%: 9 % — ABNORMAL HIGH (ref 0.0–7.0)
HEMATOCRIT: 31.3 % — AB (ref 34.8–46.6)
HEMOGLOBIN: 10.1 g/dL — AB (ref 11.6–15.9)
IMMATURE RETIC FRACT: 8 % (ref 1.60–10.00)
LYMPH%: 33.7 % (ref 14.0–49.7)
MCH: 29.4 pg (ref 25.1–34.0)
MCHC: 32.3 g/dL (ref 31.5–36.0)
MCV: 91.3 fL (ref 79.5–101.0)
MONO#: 0.4 10*3/uL (ref 0.1–0.9)
MONO%: 5.9 % (ref 0.0–14.0)
NEUT#: 3.5 10*3/uL (ref 1.5–6.5)
NEUT%: 51.3 % (ref 38.4–76.8)
Platelets: 208 10*3/uL (ref 145–400)
RBC: 3.43 10*6/uL — ABNORMAL LOW (ref 3.70–5.45)
RDW: 13.8 % (ref 11.2–14.5)
RETIC %: 3.5 % — AB (ref 0.70–2.10)
Retic Ct Abs: 120.05 10*3/uL — ABNORMAL HIGH (ref 33.70–90.70)
WBC: 6.8 10*3/uL (ref 3.9–10.3)
lymph#: 2.3 10*3/uL (ref 0.9–3.3)

## 2015-08-22 MED ORDER — INSULIN GLARGINE 100 UNIT/ML SOLOSTAR PEN: 18.0000 [IU] | PEN_INJECTOR | Freq: Every day | SUBCUTANEOUS | Status: DC

## 2015-08-22 MED ORDER — INSULIN ASPART 100 UNIT/ML FLEXPEN: 6.0000 [IU] | PEN_INJECTOR | Freq: Three times a day (TID) | SUBCUTANEOUS | Status: DC

## 2015-08-22 NOTE — Progress Notes (Signed)
Referral made to [facility]. 

## 2015-08-22 NOTE — Progress Notes (Signed)
Linda Bentley    HEMATOLOGY/ONCOLOGY CLINIC NOTE  Date of Service: .Linda Kitchen01/10/2015   Patient Care Team: Doe-Hyun Kyra Searles, DO as PCP - General (Internal Medicine) Corliss Parish, MD as Consulting Physician (Nephrology)  CHIEF COMPLAINTS/PURPOSE OF CONSULTATION:  Newly diagnosed pancreatico-biliary adenocarcinoma  HISTORY OF PRESENTING ILLNESS: Please see my initial consultation for details on initial presentation.  Interval history.  Patient is here for her scheduled follow-up for pancreatic cancer to discuss the plan for further treatment. She had an endoscopic ultrasound on 07/27/2015 by Dr. Ardis Hughs which showed Clinical uT2N1 (Stage IIb) pancreatic adenocarcinoma that clearly abuts the SMV/PV confluence (suggesting possible Invasion). She was scheduled to see Barry Dienes in the surgery clinic but decided that she would not consider surgery no matter what.   We spent significant time discussing palliative treatment options and she decided to proceed with the gemcitabine monotherapy with palliative intent. She was set up for chemotherapy counseling and after discussing the details of chemotherapy decided that she would like to forego palliative treatments and focus on best supportive cares. Chemotherapy appointments with them cancelled. Several family members were present for this discussion and all their questions were answered to their apparent satisfaction. 40 minutes spent discussing all the test results, I know stick elements, natural history, goals of care, palliative treatment options.   MEDICAL HISTORY:  Past Medical History  Diagnosis Date  . Depression   . GERD (gastroesophageal reflux disease)   . Hyperlipidemia   . Hypertension   . Osteopenia   . Renal insufficiency 2007    creat 1.3  . Hiatal hernia   . LBP (low back pain)   . UNSPECIFIED PERIPHERAL VASCULAR DISEASE 06/08/2010  . ANEMIA-UNSPECIFIED 07/03/2009  . HYPERGLYCEMIA 02/26/2007  . GERD 02/26/2007  History of pancreatitis in  2012 History of colon polyps 8-9 years ago Mammogram April 2016 was negative  SURGICAL HISTORY: Past Surgical History  Procedure Laterality Date  . Appendectomy  1962  . Cholecystectomy    . Oophorectomy  1962  . Parathyroidectomy    . Dilation and curettage of uterus  1962  . Wrist fracture surgery  10/01  . Tail bone surgery    . Esophagogastroduodenoscopy      with dilatation Dr Fuller Plan  . Tonsillectomy and adenoidectomy    . Ingrown toenails       x3  . Ercp N/A 06/22/2015    Procedure: ENDOSCOPIC RETROGRADE CHOLANGIOPANCREATOGRAPHY (ERCP);  Surgeon: Ladene Artist, MD;  Location: [facility];  Service: Endoscopy;  Laterality: N/A;  . Eus N/A 07/27/2015    Procedure: UPPER ENDOSCOPIC ULTRASOUND (EUS) RADIAL;  Surgeon: [name], MD;  Location: [facility];  Service: Endoscopy;  Laterality: N/A;    SOCIAL HISTORY: Social History   Social History  . Marital Status: Widowed    Spouse Name: N/A  . Number of Children: 0  . Years of Education: N/A   Occupational History  . Retired    Social History Main Topics  . Smoking status: Never Smoker   . Smokeless tobacco: Not on file  . Alcohol Use: No  . Drug Use: No  . Sexual Activity: Not on file   Other Topics Concern  . Not on file   Social History Narrative   Physician roster:      Nephrologist-Dr. Lyda Kalata   Ophthalmologists-Dr. [place] specialist-Dr. Dara Lords - husband passed 21 years ago.  No children   Lives in small senior apartment. Small step w/handrail to enter  home   Ambulates in home and performs all ADL's-walker as needed   No longer drives: brother, Abe People usually drives her   Enjoys reading Bible and watching christian television    FAMILY HISTORY: Family History  Problem Relation Age of Onset  . Kidney disease Father     failure  . Colon cancer Sister     ALLERGIES:  is allergic to diltiazem hcl.  MEDICATIONS:  Current Outpatient Prescriptions    Medication Sig Dispense Refill  . atorvastatin (LIPITOR) 40 MG tablet Take 1 tablet (40 mg total) by mouth daily. 90 tablet 1  . calcium carbonate (TUMS - DOSED IN MG ELEMENTAL CALCIUM) 500 MG chewable tablet Chew 1-2 tablets by mouth daily as needed for indigestion or heartburn.     . citalopram (CELEXA) 20 MG tablet TAKE 1 TABLET BY MOUTH DAILY. 90 tablet 1  . fluticasone (FLONASE) 50 MCG/ACT nasal spray Place 2 sprays into both nostrils daily. (Patient taking differently: Place 2 sprays into both nostrils daily as needed for allergies. ) 16 g 1  . labetalol (NORMODYNE) 100 MG tablet TAKE 1 TABLET BY MOUTH DAILY. 90 tablet 2  . lipase/protease/amylase (CREON) 12000 UNITS CPEP capsule Take 1 capsule (12,000 Units total) by mouth 3 (three) times daily before meals. 270 capsule 1  . LORazepam (ATIVAN) 0.5 MG tablet Take 1 tablet (0.5 mg total) by mouth daily as needed. for anxiety 30 tablet 3  . omeprazole (PRILOSEC) 40 MG capsule TAKE 1 CAPSULE BY MOUTH DAILY. 90 capsule 1  . oxybutynin (DITROPAN XL) 15 MG 24 hr tablet Take 15 mg by mouth at bedtime.    . polyethylene glycol powder (GLYCOLAX/MIRALAX) powder Take 17 g by mouth daily as needed for severe constipation. 3350 g 3  . senna-docusate (SENNA S) 8.6-50 MG tablet Take 1 tablet by mouth at bedtime as needed for mild constipation. 20 tablet 0  . insulin aspart (NOVOLOG) 100 UNIT/ML FlexPen Inject 6 Units into the skin 3 (three) times daily with meals. 15 mL 11  . Insulin Glargine (LANTUS) 100 UNIT/ML Solostar Pen Inject 18 Units into the skin daily at 10 pm. 15 mL 11   No current facility-administered medications for this visit.    REVIEW OF SYSTEMS:    10 Point review of Systems was done is negative except as noted above.  PHYSICAL EXAMINATION: ECOG PERFORMANCE STATUS: 2-3  . Filed Vitals:   08/08/15 0939  BP: 115/53  Pulse: 72  Temp: 98.1 F (36.7 C)  Resp: 18   Filed Weights   08/08/15 0939  Weight: 189 lb (85.73 kg)    .Body mass index is 35.73 kg/(m^2).  Linda Bentley Wt Readings from Last 3 Encounters:  08/22/15 188 lb 11.2 oz (85.594 kg)  08/08/15 189 lb (85.73 kg)  07/29/15 180 lb 5.4 oz (81.8 kg)    GENERAL:elderly somewhat fatigued appearing lady, alert, in no acute distress and comfortable SKIN: skin color, texture, turgor are normal, no rashes or significant lesions EYES: normal, conjunctiva are pink and non-injected, sclera clear OROPHARYNX:no exudate, no erythema and lips, buccal mucosa, and tongue normal  NECK: supple, no JVD, thyroid normal size, non-tender, without nodularity LYMPH:  no palpable lymphadenopathy in the cervical, axillary or inguinal LUNGS: clear to auscultation with normal respiratory effort HEART: regular rate & rhythm,  no murmurs and no lower extremity edema ABDOMEN: abdomen soft, non-tender, normoactive bowel sounds  Musculoskeletal: no cyanosis of digits and no clubbing  PSYCH: alert & oriented x 3 with fluent speech NEURO:  no focal motor/sensory deficits  LABORATORY DATA:  I have reviewed the data as listed  .Linda Bentley CBC Latest Ref Rng 08/22/2015 07/28/2015 07/18/2015  WBC 3.9 - 10.3 10e3/uL 6.8 7.9 9.1  Hemoglobin 11.6 - 15.9 g/dL 10.1(L) 12.3 11.3(L)  Hematocrit 34.8 - 46.6 % 31.3(L) 36.8 34.3(L)  Platelets 145 - 400 10e3/uL 208 162 144(L)     . CMP Latest Ref Rng 08/22/2015 08/01/2015 07/31/2015  Glucose 70 - 140 mg/dl 149(H) 156(H) 168(H)  BUN 7.0 - 26.0 mg/dL 18.8 11 16   Creatinine 0.6 - 1.1 mg/dL 1.1 0.98 1.04(H)  Sodium 136 - 145 mEq/L 141 137 139  Potassium 3.5 - 5.1 mEq/L 4.7 3.9 3.4(L)  Chloride 101 - 111 mmol/L - 114(H) 113(H)  CO2 22 - 29 mEq/L 23 17(L) 17(L)  Calcium 8.4 - 10.4 mg/dL 9.2 7.6(L) 8.0(L)  Total Protein 6.4 - 8.3 g/dL 6.2(L) - -  Total Bilirubin 0.20 - 1.20 mg/dL 0.61 - -  Alkaline Phos 40 - 150 U/L 66 - -  AST 5 - 34 U/L 13 - -  ALT 0 - 55 U/L <9 - -    . Lab Results  Component Value Date   CA199 84.5* 07/04/2015        RADIOGRAPHIC STUDIES: I have personally reviewed the radiological images as listed and agreed with the findings in the report. No results found.  Endoscopic findings (with radial echoendoscope): 1. Previously placed metal biliary stent was noted extending into the duodenum across the major papilla 2. Otherwise normal UGI tract EUS findings: 1. Hypoerchoic, heterogeneous 2.5cm mass in the head of pancreas. This mass appears to directly abut the SMV/PV confluence (uT2). 2. Single, 1cm round suspicious lymhnode near the pancreatic head (uN1) 3. The main pancreatic duct is dilated (up to 65mm in body, tail). 4. Previously placed metal biliary stent in good position within the CBD. ENDOSCOPIC IMPRESSION: Clinical uT2N1 (Stage IIb) pancreatic adenocarcinoma that clearly abuts the SMV/PV confluence (suggesting possible Invasion)    ASSESSMENT & PLAN:   82 year old elderly lady with multiple medical comorbidities including hypertension, dyslipidemia, chronic kidney disease with  #1 cT2N1M0 Stage IIB (based on endoscopic ultrasound )Pancreatic Adenocarcinoma. PET scan shows no evidence of local lymph node involvement or metastases but is somewhat less sensitive given the patient's pre-test blood sugar level was 231. Patient has had rapid weight loss with fatigue and anorexia. Has lost about 30 pounds in the last 6 weeks or so. No abdominal pain. Some concern for exocrine pancreatic insufficiency. CA-19-9 level somewhat elevated at 84.5 #2 Abnormal liver function tests due to CBD obstruction from pancreatic head mass and noted biliary duct stricture. Patient's liver function continue to improve status post ERCP based metal stent placement. #3 severe protein calorie malnutrition. No significant improvement in oral intake with dexamethasone.  #4. Newly diagnosed diabetes with hyperosmolar state requiring hospitalization .patient is now on an insulin regimen and her blood sugars are  better.  5 exocrine pancreatic insufficiency on Creon  Plan -After extensive discussion with the patient and her family the patient had decided to proceed with palliative gemcitabine chemotherapy and was completely opposed to any consideration of surgery. The decision was quite understandable . -Patient was given an appointment for chemotherapy counseling and subsequently after the clinic visit decided to pursue best supportive care's and hold off on any palliative chemotherapy. This is not an unreasonable decision. -Patient is undergoing rehabilitation at a skilled nursing facility at this time. -We will reconnect with the patient in 2 weeks to  finalize her goals of care discussion and determine if she would want to officially enroll in hospice.  All of the patients And her family's questions were answered in details to their apparent satisfaction. The patient knows to call the clinic with any problems, questions or concerns.  I spent 40 minutes counseling the patient face to face. The total time spent in the appointment was 40 minutes and more than 50% was on counseling and direct patient cares.    Sullivan Lone MD [place] AAHIVMS [facility] Hematology/Oncology Physician [facility]  (Office):       [phone] (Work cell):  [phone] (Fax):           [phone]

## 2015-08-22 NOTE — Progress Notes (Signed)
Marland Kitchen    HEMATOLOGY/ONCOLOGY CLINIC NOTE  Date of Service: .08/22/2015  Patient Care Team: Doe-Hyun Kyra Searles, DO as PCP - General (Internal Medicine) Corliss Parish, MD as Consulting Physician (Nephrology)  CC: Follow-up of pancreatic cancer  Diagnosis Stage IIB pancreatic cancer  HISTORY OF PRESENTING ILLNESS: Please see my initial consultation for details on initial presentation.  Interval history.  Patient is here for her scheduled follow-up for pancreatic cancer. She notes that she has been doing better at her skilled nursing facility and her appetite has improved some. Notes mild constipation but has been getting Senokot and stool softeners. Notes that her diabetes under better control. Requests insulin pen is in the vial to be easier for her to administer insulin at home. Prescription for Lantus and NovoLog [facility]. The patient and several of her family members had a details and on discussion with me to iron out the final care plan and goals of care for her pancreatic cancer. Patient notes that it is important for her to be at home and notes that she generally feels well at this point and would not want to consider palliative chemotherapy. She prefers to focus on best supportive cares and would like to spend her final days with her family. We discussed enrollment into hospice. She had her family are quite agreeable with consideration of home hospice services at this time. She has a fairly supportive family. No other acute new symptoms. No abdominal pain. Has some right lower anterior chest pain which she notes is positional and sounds like a possible intercostal nerve entrapment. She notes that using a bra helps with her pain.   MEDICAL HISTORY:  Past Medical History  Diagnosis Date  . Depression   . GERD (gastroesophageal reflux disease)   . Hyperlipidemia   . Hypertension   . Osteopenia   . Renal insufficiency 2007    creat 1.3  . Hiatal hernia   . LBP  (low back pain)   . UNSPECIFIED PERIPHERAL VASCULAR DISEASE 06/08/2010  . ANEMIA-UNSPECIFIED 07/03/2009  . HYPERGLYCEMIA 02/26/2007  . GERD 02/26/2007  History of pancreatitis in 2012 History of colon polyps 8-9 years ago Mammogram April 2016 was negative  SURGICAL HISTORY: Past Surgical History  Procedure Laterality Date  . Appendectomy  1962  . Cholecystectomy    . Oophorectomy  1962  . Parathyroidectomy    . Dilation and curettage of uterus  1962  . Wrist fracture surgery  10/01  . Tail bone surgery    . Esophagogastroduodenoscopy      with dilatation Dr Fuller Plan  . Tonsillectomy and adenoidectomy    . Ingrown toenails       x3  . Ercp N/A 06/22/2015    Procedure: ENDOSCOPIC RETROGRADE CHOLANGIOPANCREATOGRAPHY (ERCP);  Surgeon: Ladene Artist, MD;  Location: [facility];  Service: Endoscopy;  Laterality: N/A;  . Eus N/A 07/27/2015    Procedure: UPPER ENDOSCOPIC ULTRASOUND (EUS) RADIAL;  Surgeon: [name], MD;  Location: [facility];  Service: Endoscopy;  Laterality: N/A;    SOCIAL HISTORY: Social History   Social History  . Marital Status: Widowed    Spouse Name: N/A  . Number of Children: 0  . Years of Education: N/A   Occupational History  . Retired    Social History Main Topics  . Smoking status: Never Smoker   . Smokeless tobacco: Not on file  . Alcohol Use: No  . Drug Use: No  . Sexual Activity: Not on file   Other  Topics Concern  . Not on file   Social History Narrative   Physician roster:      Nephrologist-Dr. Lyda Kalata   Ophthalmologists-Dr. [place] specialist-Dr. Dara Lords - husband passed 21 years ago.  No children   Lives in small senior apartment. Small step w/handrail to enter home   Ambulates in home and performs all ADL's-walker as needed   No longer drives: brother, Abe People usually drives her   Enjoys reading Bible and watching christian television    FAMILY HISTORY: Family History  Problem Relation  Age of Onset  . Kidney disease Father     failure  . Colon cancer Sister     ALLERGIES:  is allergic to diltiazem hcl.  MEDICATIONS:  Current Outpatient Prescriptions  Medication Sig Dispense Refill  . atorvastatin (LIPITOR) 40 MG tablet Take 1 tablet (40 mg total) by mouth daily. 90 tablet 1  . calcium carbonate (TUMS - DOSED IN MG ELEMENTAL CALCIUM) 500 MG chewable tablet Chew 1-2 tablets by mouth daily as needed for indigestion or heartburn.     . citalopram (CELEXA) 20 MG tablet TAKE 1 TABLET BY MOUTH DAILY. 90 tablet 1  . fluticasone (FLONASE) 50 MCG/ACT nasal spray Place 2 sprays into both nostrils daily. (Patient taking differently: Place 2 sprays into both nostrils daily as needed for allergies. ) 16 g 1  . labetalol (NORMODYNE) 100 MG tablet TAKE 1 TABLET BY MOUTH DAILY. 90 tablet 2  . lipase/protease/amylase (CREON) 12000 UNITS CPEP capsule Take 1 capsule (12,000 Units total) by mouth 3 (three) times daily before meals. 270 capsule 1  . LORazepam (ATIVAN) 0.5 MG tablet Take 1 tablet (0.5 mg total) by mouth daily as needed. for anxiety 30 tablet 3  . omeprazole (PRILOSEC) 40 MG capsule TAKE 1 CAPSULE BY MOUTH DAILY. 90 capsule 1  . oxybutynin (DITROPAN XL) 15 MG 24 hr tablet Take 15 mg by mouth at bedtime.    . polyethylene glycol powder (GLYCOLAX/MIRALAX) powder Take 17 g by mouth daily as needed for severe constipation. 3350 g 3  . senna-docusate (SENNA S) 8.6-50 MG tablet Take 1 tablet by mouth at bedtime as needed for mild constipation. 20 tablet 0  . insulin aspart (NOVOLOG) 100 UNIT/ML FlexPen Inject 6 Units into the skin 3 (three) times daily with meals. 15 mL 11  . Insulin Glargine (LANTUS) 100 UNIT/ML Solostar Pen Inject 18 Units into the skin daily at 10 pm. 15 mL 11   No current facility-administered medications for this visit.    REVIEW OF SYSTEMS:    10 Point review of Systems was done is negative except as noted above.  PHYSICAL EXAMINATION: ECOG PERFORMANCE  STATUS: 2-3  . Filed Vitals:   08/22/15 0908  BP: 123/59  Pulse: 80  Temp: 98.4 F (36.9 C)  Resp: 17   Filed Weights   08/22/15 0908  Weight: 188 lb 11.2 oz (85.594 kg)   .Body mass index is 35.67 kg/(m^2).  Marland Kitchen Wt Readings from Last 3 Encounters:  08/22/15 188 lb 11.2 oz (85.594 kg)  08/08/15 189 lb (85.73 kg)  07/29/15 180 lb 5.4 oz (81.8 kg)    GENERAL:elderly somewhat fatigued appearing lady, alert, in no acute distress and comfortable SKIN: skin color, texture, turgor are normal, no rashes or significant lesions EYES: normal, conjunctiva are pink and non-injected, sclera clear OROPHARYNX:no exudate, no erythema and lips, buccal mucosa, and tongue normal  NECK: supple, no JVD, thyroid normal size, non-tender,  without nodularity LYMPH:  no palpable lymphadenopathy in the cervical, axillary or inguinal LUNGS: clear to auscultation with normal respiratory effort HEART: regular rate & rhythm,  no murmurs and no lower extremity edema ABDOMEN: abdomen soft, non-tender, normoactive bowel sounds  Musculoskeletal: no cyanosis of digits and no clubbing  PSYCH: alert & oriented x 3 with fluent speech NEURO: no focal motor/sensory deficits  LABORATORY DATA:  I have reviewed the data as listed  .Marland Kitchen CBC Latest Ref Rng 08/22/2015 07/28/2015 07/18/2015  WBC 3.9 - 10.3 10e3/uL 6.8 7.9 9.1  Hemoglobin 11.6 - 15.9 g/dL 10.1(L) 12.3 11.3(L)  Hematocrit 34.8 - 46.6 % 31.3(L) 36.8 34.3(L)  Platelets 145 - 400 10e3/uL 208 162 144(L)     . CMP Latest Ref Rng 08/22/2015 08/01/2015 07/31/2015  Glucose 70 - 140 mg/dl 149(H) 156(H) 168(H)  BUN 7.0 - 26.0 mg/dL 18.8 11 16   Creatinine 0.6 - 1.1 mg/dL 1.1 0.98 1.04(H)  Sodium 136 - 145 mEq/L 141 137 139  Potassium 3.5 - 5.1 mEq/L 4.7 3.9 3.4(L)  Chloride 101 - 111 mmol/L - 114(H) 113(H)  CO2 22 - 29 mEq/L 23 17(L) 17(L)  Calcium 8.4 - 10.4 mg/dL 9.2 7.6(L) 8.0(L)  Total Protein 6.4 - 8.3 g/dL 6.2(L) - -  Total Bilirubin 0.20 - 1.20 mg/dL  0.61 - -  Alkaline Phos 40 - 150 U/L 66 - -  AST 5 - 34 U/L 13 - -  ALT 0 - 55 U/L <9 - -    . Lab Results  Component Value Date   CA199 84.5* 07/04/2015       RADIOGRAPHIC STUDIES: I have personally reviewed the radiological images as listed and agreed with the findings in the report. No results found.  Endoscopic findings (with radial echoendoscope): 1. Previously placed metal biliary stent was noted extending into the duodenum across the major papilla 2. Otherwise normal UGI tract EUS findings: 1. Hypoerchoic, heterogeneous 2.5cm mass in the head of pancreas. This mass appears to directly abut the SMV/PV confluence (uT2). 2. Single, 1cm round suspicious lymhnode near the pancreatic head (uN1) 3. The main pancreatic duct is dilated (up to 69mm in body, tail). 4. Previously placed metal biliary stent in good position within the CBD. ENDOSCOPIC IMPRESSION: Clinical uT2N1 (Stage IIb) pancreatic adenocarcinoma that clearly abuts the SMV/PV confluence (suggesting possible Invasion)    ASSESSMENT & PLAN:   82 year old elderly lady with multiple medical comorbidities including hypertension, dyslipidemia, chronic kidney disease with  #1 cT2N1M0 Stage IIB (based on endoscopic ultrasound )Pancreatic Adenocarcinoma.No abdominal pain or other bothersome symptoms from her pancreatic tumor at this time. Reasonable by mouth intake currently . Has been taking Creon regularly . Liver functions stable . #2 Abnormal liver function tests due to CBD obstruction from pancreatic head mass and noted biliary duct stricture. Patient's liver function continue to improve status post ERCP based metal stent placement. #3 severe protein calorie malnutrition. No significant improvement in oral intake with dexamethasone.  #4. Newly diagnosed diabetes with hyperosmolar state requiring hospitalization .patient is now on an insulin regimen and her blood sugars are better.  5 exocrine pancreatic insufficiency  on Creon  Plan -Prescription for insulin Lantus and insulin NovoLog pens sent to the patient's pharmacy as per her request. -The referral was given to [facility] services after extensive discussion with the patient and her family . -She was encouraged to talk to her skull nursing facility and to [facility] hospice to determine services and timing for enrollment into hospice services. She might potentially  choose to function with home care services if those better meet her needs for a while but would sooner need to transition to hospice services to ensure appropriate delivery of best supportive cares at home. -She has my contact information in clinic and was told to call if any new questions or concerns arise. No specific clinic follow-up was set up at this time unless needed.   We'll continue to follow the patient as needed. Hospice referral provided for [facility].  All of the patients And her family's questions were answered in details to their apparent satisfaction. The patient knows to call the clinic with any problems, questions or concerns.  I spent 25 minutes counseling the patient face to face. The total time spent in the appointment was 25 minutes and more than 50% was on counseling and direct patient cares.    Sullivan Lone MD [place] AAHIVMS [facility] Hematology/Oncology Physician [facility]  (Office):       [phone] (Work cell):  [phone] (Fax):           [phone]

## 2015-08-23 LAB — CANCER ANTIGEN 19-9: CA 19-9: 34 U/mL (ref 0–35)

## 2015-08-23 LAB — CANCER ANTIGEN 19-9 (PARALLEL TESTING): CA 19-9: 53.8 U/mL — ABNORMAL HIGH (ref <35.0)

## 2015-08-29 ENCOUNTER — Encounter: Payer: Self-pay | Admitting: Adult Health

## 2015-08-29 ENCOUNTER — Telehealth: Payer: Self-pay | Admitting: Hematology

## 2015-08-29 ENCOUNTER — Non-Acute Institutional Stay (SKILLED_NURSING_FACILITY): Payer: Medicare Other | Admitting: Adult Health

## 2015-08-29 DIAGNOSIS — F32A Depression, unspecified: Secondary | ICD-10-CM

## 2015-08-29 DIAGNOSIS — F329 Major depressive disorder, single episode, unspecified: Secondary | ICD-10-CM

## 2015-08-29 DIAGNOSIS — E1101 Type 2 diabetes mellitus with hyperosmolarity with coma: Secondary | ICD-10-CM

## 2015-08-29 DIAGNOSIS — N183 Chronic kidney disease, stage 3 unspecified: Secondary | ICD-10-CM

## 2015-08-29 DIAGNOSIS — C250 Malignant neoplasm of head of pancreas: Secondary | ICD-10-CM | POA: Diagnosis not present

## 2015-08-29 MED ORDER — LORAZEPAM 0.5 MG PO TABS: 0.5000 mg | ORAL_TABLET | Freq: Every day | ORAL | Status: DC | PRN

## 2015-08-29 NOTE — Progress Notes (Signed)
Patient ID: Linda Bentley, female   DOB: Apr 27, 1929, 82 y.o.   MRN: [id number]    Facility:  [facility]    PCP Drema Pry, DO    Allergies  Allergen Reactions  . Diltiazem Hcl Other (See Comments)    REACTION: feeling drunk    Chief Complaint  Patient presents with  . Discharge Note    HPI:  She is being discharged to home with hospice care. Hospice will setup any dme she requires and will follow up medically. She will need her prescriptions to be written. She was admitted to this facility for short term rehab; after suffering increased weakness. She has recently been diagnosed with pancreatic adenocarcinoma.   Past Medical History  Diagnosis Date  . Depression   . GERD (gastroesophageal reflux disease)   . Hyperlipidemia   . Hypertension   . Osteopenia   . Renal insufficiency 2007    creat 1.3  . Hiatal hernia   . LBP (low back pain)   . UNSPECIFIED PERIPHERAL VASCULAR DISEASE 06/08/2010  . ANEMIA-UNSPECIFIED 07/03/2009  . HYPERGLYCEMIA 02/26/2007  . GERD 02/26/2007    Past Surgical History  Procedure Laterality Date  . Appendectomy  1962  . Cholecystectomy    . Oophorectomy  1962  . Parathyroidectomy    . Dilation and curettage of uterus  1962  . Wrist fracture surgery  10/01  . Tail bone surgery    . Esophagogastroduodenoscopy      with dilatation Dr Fuller Plan  . Tonsillectomy and adenoidectomy    . Ingrown toenails       x3  . Ercp N/A 06/22/2015    Procedure: ENDOSCOPIC RETROGRADE CHOLANGIOPANCREATOGRAPHY (ERCP);  Surgeon: Ladene Artist, MD;  Location: [facility];  Service: Endoscopy;  Laterality: N/A;  . Eus N/A 07/27/2015    Procedure: UPPER ENDOSCOPIC ULTRASOUND (EUS) RADIAL;  Surgeon: [name], MD;  Location: [facility];  Service: Endoscopy;  Laterality: N/A;    VITAL SIGNS BP 112/63 mmHg  Pulse 94  Temp(Src) 98.4 F (36.9 C)  Resp 18  Ht 5\' 5"  (1.651 m)  Wt 194 lb (87.998 kg)  BMI 32.28 kg/m2  SpO2 96%  Patient's Medications    New Prescriptions   No medications on file  Previous Medications   ATORVASTATIN (LIPITOR) 40 MG TABLET    Take 1 tablet (40 mg total) by mouth daily.   CALCIUM CARBONATE (TUMS - DOSED IN MG ELEMENTAL CALCIUM) 500 MG CHEWABLE TABLET    Chew 1-2 tablets by mouth daily as needed for indigestion or heartburn.    CITALOPRAM (CELEXA) 20 MG TABLET    TAKE 1 TABLET BY MOUTH DAILY.   FLUTICASONE (FLONASE) 50 MCG/ACT NASAL SPRAY    Place 2 sprays into both nostrils daily.   INSULIN ASPART (NOVOLOG) 100 UNIT/ML FLEXPEN    Inject 6 Units into the skin 3 (three) times daily with meals.   INSULIN GLARGINE (LANTUS) 100 UNIT/ML SOLOSTAR PEN    Inject 18 Units into the skin daily at 10 pm.   LABETALOL (NORMODYNE) 100 MG TABLET    TAKE 1 TABLET BY MOUTH DAILY.   LIPASE/PROTEASE/AMYLASE (CREON) 12000 UNITS CPEP CAPSULE    Take 1 capsule (12,000 Units total) by mouth 3 (three) times daily before meals.   LORAZEPAM (ATIVAN) 0.5 MG TABLET    Take 1 tablet (0.5 mg total) by mouth daily as needed. for anxiety   OMEPRAZOLE (PRILOSEC) 40 MG CAPSULE    TAKE 1 CAPSULE BY MOUTH DAILY.   POLYETHYLENE  GLYCOL POWDER (GLYCOLAX/MIRALAX) POWDER    Take 17 g by mouth daily as needed for severe constipation.   SENNA-DOCUSATE (SENNA S) 8.6-50 MG TABLET    Take 1 tablet by mouth at bedtime as needed for mild constipation.  Modified Medications   No medications on file  Discontinued Medications     SIGNIFICANT DIAGNOSTIC EXAMS  LABS REVIEWED:   08-01-15: glucose 156; bun 11; creat 0.98; k+ 3.9; na++137    Review of Systems  Constitutional: Negative for malaise/fatigue.  Respiratory: Negative for cough and shortness of breath.   Cardiovascular: Negative for chest pain, palpitations and leg swelling.  Gastrointestinal: Negative for heartburn, abdominal pain and constipation.  Musculoskeletal: Negative for myalgias and joint pain.  Skin: Negative.   Neurological: Negative for headaches.  Psychiatric/Behavioral: The  patient is not nervous/anxious.     Physical Exam  Constitutional: She is oriented to person, place, and time. No distress.  Overweight   Eyes: Conjunctivae are normal.  Neck: Neck supple. No JVD present. No thyromegaly present.  Cardiovascular: Normal rate, regular rhythm and intact distal pulses.   Respiratory: Effort normal and breath sounds normal. No respiratory distress. She has no wheezes.  GI: Soft. Bowel sounds are normal. She exhibits no distension. There is no tenderness.  Musculoskeletal: She exhibits no edema.  Able to move all extremities   Lymphadenopathy:    She has no cervical adenopathy.  Neurological: She is alert and oriented to person, place, and time.  Skin: Skin is warm and dry. She is not diaphoretic.  Psychiatric: She has a normal mood and affect.       ASSESSMENT/ PLAN:  Will discharge to home with hospice care; who will provide her DME needs and will provide her medical follow up. Her prescriptions have been written for a 30 day supply of medications with #30 ativan 0.5 mg tabs.    Time spent with patient  35   minutes >50% time spent counseling; reviewing medical record; tests; labs; and developing future plan of care   Ok Edwards NP [facility]  Contact [phone] Monday through Friday 8am- 5pm  After hours call [phone]

## 2015-08-29 NOTE — Telephone Encounter (Signed)
Received a voicemail message from Penni Bombard at [facility] - per Mr. Theda Sers he has been calling since yesterday re appointments for [facility] 12/08/1928 and has been hung up on - transferred and just not able to get through. Patient was last seen 1/17 and no pof/information has been sent re appointments or next steps for patient. Spoke with desk nurse re this message - she will speak with [place] re next steps/pof. Returned call to Mr. Theda Sers but was not able to reach him - left on hold.

## 2015-09-13 ENCOUNTER — Ambulatory Visit: Payer: Medicare Other | Admitting: Internal Medicine

## 2015-09-25 ENCOUNTER — Encounter: Payer: Self-pay | Admitting: Internal Medicine

## 2015-09-26 DIAGNOSIS — D3131 Benign neoplasm of right choroid: Secondary | ICD-10-CM | POA: Diagnosis not present

## 2015-10-03 DIAGNOSIS — Z7189 Other specified counseling: Secondary | ICD-10-CM | POA: Diagnosis not present

## 2015-10-09 ENCOUNTER — Telehealth: Payer: Self-pay | Admitting: *Deleted

## 2015-10-09 NOTE — Telephone Encounter (Addendum)
"  I'd like to talk with Dr. Irene Limbo today.  I have pancreatic cancer and need to see dentist for teeth cleaning.  Need to know if I can have a six month check up.  Call [phone] today".

## 2015-10-09 NOTE — Telephone Encounter (Signed)
Spoke with MD Irene Limbo. He is okay with her getting regular teeth cleaning. Informed patient and verbalized understanding.

## 2015-10-20 DIAGNOSIS — D631 Anemia in chronic kidney disease: Secondary | ICD-10-CM | POA: Diagnosis not present

## 2015-10-20 DIAGNOSIS — I890 Lymphedema, not elsewhere classified: Secondary | ICD-10-CM | POA: Diagnosis not present

## 2015-10-20 DIAGNOSIS — N2581 Secondary hyperparathyroidism of renal origin: Secondary | ICD-10-CM | POA: Diagnosis not present

## 2015-10-20 DIAGNOSIS — N183 Chronic kidney disease, stage 3 (moderate): Secondary | ICD-10-CM | POA: Diagnosis not present

## 2015-10-20 DIAGNOSIS — I10 Essential (primary) hypertension: Secondary | ICD-10-CM | POA: Diagnosis not present

## 2015-11-01 ENCOUNTER — Other Ambulatory Visit: Payer: Self-pay

## 2015-11-01 DIAGNOSIS — Z1231 Encounter for screening mammogram for malignant neoplasm of breast: Secondary | ICD-10-CM

## 2015-11-02 ENCOUNTER — Encounter: Payer: Self-pay | Admitting: *Deleted

## 2015-11-02 NOTE — Progress Notes (Signed)
[place] Work  Clinical Social Work was referred by patient for assessment of psychosocial needs due to issues with financial concerns due to lab billing.  CSW shared letter with Darlena in managed care as well. Clinical Social Worker phoned pt to offer support and assess for needs.  Pt reports to be receiving hospice services through [facility] and is receiving RN, CNA and SW assistance. CSW suggested pt discuss this lab work with hospice as they may be able to assist. Pt is not in active treatment, so not eligible for cancer assistance programs currently. CSW suggested pt could pay small amount to the lab monthly until bill is resolved. Pt currently has [facility] and reports most bills are very low, so she is concerned about this outstanding bill. Pt appreciated feedback and call in response to her letter. Pt reports she sent a "letter to [employer] in [place]" and got no response. Pt agrees to discuss this with her hospice team as well.   Clinical Social Work interventions:  Resource education  Loren Racer, [place] Worker [place]  [place] Phone: [phone] Fax: [phone]

## 2015-12-04 DIAGNOSIS — C259 Malignant neoplasm of pancreas, unspecified: Secondary | ICD-10-CM | POA: Diagnosis not present

## 2015-12-04 DIAGNOSIS — J301 Allergic rhinitis due to pollen: Secondary | ICD-10-CM | POA: Diagnosis not present

## 2015-12-04 DIAGNOSIS — F339 Major depressive disorder, recurrent, unspecified: Secondary | ICD-10-CM | POA: Diagnosis not present

## 2015-12-04 DIAGNOSIS — E785 Hyperlipidemia, unspecified: Secondary | ICD-10-CM | POA: Diagnosis not present

## 2015-12-04 DIAGNOSIS — K831 Obstruction of bile duct: Secondary | ICD-10-CM | POA: Diagnosis not present

## 2015-12-04 DIAGNOSIS — K219 Gastro-esophageal reflux disease without esophagitis: Secondary | ICD-10-CM | POA: Diagnosis not present

## 2015-12-04 DIAGNOSIS — I10 Essential (primary) hypertension: Secondary | ICD-10-CM | POA: Diagnosis not present

## 2015-12-04 DIAGNOSIS — E119 Type 2 diabetes mellitus without complications: Secondary | ICD-10-CM | POA: Diagnosis not present

## 2015-12-06 ENCOUNTER — Other Ambulatory Visit: Payer: Self-pay | Admitting: Internal Medicine

## 2015-12-12 ENCOUNTER — Ambulatory Visit: Payer: Medicare Other

## 2016-01-04 DIAGNOSIS — C259 Malignant neoplasm of pancreas, unspecified: Secondary | ICD-10-CM | POA: Diagnosis not present

## 2016-01-04 DIAGNOSIS — E119 Type 2 diabetes mellitus without complications: Secondary | ICD-10-CM | POA: Diagnosis not present

## 2016-01-04 DIAGNOSIS — K831 Obstruction of bile duct: Secondary | ICD-10-CM | POA: Diagnosis not present

## 2016-01-04 DIAGNOSIS — F339 Major depressive disorder, recurrent, unspecified: Secondary | ICD-10-CM | POA: Diagnosis not present

## 2016-01-04 DIAGNOSIS — J301 Allergic rhinitis due to pollen: Secondary | ICD-10-CM | POA: Diagnosis not present

## 2016-01-04 DIAGNOSIS — E785 Hyperlipidemia, unspecified: Secondary | ICD-10-CM | POA: Diagnosis not present

## 2016-01-04 DIAGNOSIS — I10 Essential (primary) hypertension: Secondary | ICD-10-CM | POA: Diagnosis not present

## 2016-01-04 DIAGNOSIS — K219 Gastro-esophageal reflux disease without esophagitis: Secondary | ICD-10-CM | POA: Diagnosis not present

## 2016-02-03 DIAGNOSIS — F339 Major depressive disorder, recurrent, unspecified: Secondary | ICD-10-CM | POA: Diagnosis not present

## 2016-02-03 DIAGNOSIS — J301 Allergic rhinitis due to pollen: Secondary | ICD-10-CM | POA: Diagnosis not present

## 2016-02-03 DIAGNOSIS — C259 Malignant neoplasm of pancreas, unspecified: Secondary | ICD-10-CM | POA: Diagnosis not present

## 2016-02-03 DIAGNOSIS — K219 Gastro-esophageal reflux disease without esophagitis: Secondary | ICD-10-CM | POA: Diagnosis not present

## 2016-02-03 DIAGNOSIS — K831 Obstruction of bile duct: Secondary | ICD-10-CM | POA: Diagnosis not present

## 2016-02-03 DIAGNOSIS — E119 Type 2 diabetes mellitus without complications: Secondary | ICD-10-CM | POA: Diagnosis not present

## 2016-02-03 DIAGNOSIS — I10 Essential (primary) hypertension: Secondary | ICD-10-CM | POA: Diagnosis not present

## 2016-02-03 DIAGNOSIS — E785 Hyperlipidemia, unspecified: Secondary | ICD-10-CM | POA: Diagnosis not present

## 2016-02-08 ENCOUNTER — Encounter: Payer: Self-pay | Admitting: Hematology

## 2016-02-08 NOTE — Progress Notes (Signed)
I will let dr. Irene Limbo know lantus solostar 100 unit injection his insurance will not cover.

## 2016-03-05 ENCOUNTER — Ambulatory Visit: Payer: Medicare Other | Admitting: Neurology

## 2016-03-05 DIAGNOSIS — J301 Allergic rhinitis due to pollen: Secondary | ICD-10-CM | POA: Diagnosis not present

## 2016-03-05 DIAGNOSIS — E785 Hyperlipidemia, unspecified: Secondary | ICD-10-CM | POA: Diagnosis not present

## 2016-03-05 DIAGNOSIS — F339 Major depressive disorder, recurrent, unspecified: Secondary | ICD-10-CM | POA: Diagnosis not present

## 2016-03-05 DIAGNOSIS — K219 Gastro-esophageal reflux disease without esophagitis: Secondary | ICD-10-CM | POA: Diagnosis not present

## 2016-03-05 DIAGNOSIS — K831 Obstruction of bile duct: Secondary | ICD-10-CM | POA: Diagnosis not present

## 2016-03-05 DIAGNOSIS — I10 Essential (primary) hypertension: Secondary | ICD-10-CM | POA: Diagnosis not present

## 2016-03-05 DIAGNOSIS — E119 Type 2 diabetes mellitus without complications: Secondary | ICD-10-CM | POA: Diagnosis not present

## 2016-03-05 DIAGNOSIS — C259 Malignant neoplasm of pancreas, unspecified: Secondary | ICD-10-CM | POA: Diagnosis not present

## 2016-04-05 DIAGNOSIS — K219 Gastro-esophageal reflux disease without esophagitis: Secondary | ICD-10-CM | POA: Diagnosis not present

## 2016-04-05 DIAGNOSIS — E785 Hyperlipidemia, unspecified: Secondary | ICD-10-CM | POA: Diagnosis not present

## 2016-04-05 DIAGNOSIS — I10 Essential (primary) hypertension: Secondary | ICD-10-CM | POA: Diagnosis not present

## 2016-04-05 DIAGNOSIS — K831 Obstruction of bile duct: Secondary | ICD-10-CM | POA: Diagnosis not present

## 2016-04-05 DIAGNOSIS — J301 Allergic rhinitis due to pollen: Secondary | ICD-10-CM | POA: Diagnosis not present

## 2016-04-05 DIAGNOSIS — C259 Malignant neoplasm of pancreas, unspecified: Secondary | ICD-10-CM | POA: Diagnosis not present

## 2016-04-05 DIAGNOSIS — F339 Major depressive disorder, recurrent, unspecified: Secondary | ICD-10-CM | POA: Diagnosis not present

## 2016-04-05 DIAGNOSIS — E119 Type 2 diabetes mellitus without complications: Secondary | ICD-10-CM | POA: Diagnosis not present

## 2016-05-05 DIAGNOSIS — E785 Hyperlipidemia, unspecified: Secondary | ICD-10-CM | POA: Diagnosis not present

## 2016-05-05 DIAGNOSIS — J301 Allergic rhinitis due to pollen: Secondary | ICD-10-CM | POA: Diagnosis not present

## 2016-05-05 DIAGNOSIS — F339 Major depressive disorder, recurrent, unspecified: Secondary | ICD-10-CM | POA: Diagnosis not present

## 2016-05-05 DIAGNOSIS — K219 Gastro-esophageal reflux disease without esophagitis: Secondary | ICD-10-CM | POA: Diagnosis not present

## 2016-05-05 DIAGNOSIS — E119 Type 2 diabetes mellitus without complications: Secondary | ICD-10-CM | POA: Diagnosis not present

## 2016-05-05 DIAGNOSIS — K831 Obstruction of bile duct: Secondary | ICD-10-CM | POA: Diagnosis not present

## 2016-05-05 DIAGNOSIS — I10 Essential (primary) hypertension: Secondary | ICD-10-CM | POA: Diagnosis not present

## 2016-05-05 DIAGNOSIS — C259 Malignant neoplasm of pancreas, unspecified: Secondary | ICD-10-CM | POA: Diagnosis not present

## 2016-08-07 ENCOUNTER — Emergency Department (HOSPITAL_COMMUNITY)
Admission: EM | Admit: 2016-08-07 | Discharge: 2016-08-07 | Disposition: A | Discharge status: Home / Self Care | Attending: Emergency Medicine | Admitting: Emergency Medicine

## 2016-08-07 ENCOUNTER — Emergency Department (HOSPITAL_COMMUNITY)

## 2016-08-07 ENCOUNTER — Encounter (HOSPITAL_COMMUNITY): Payer: Self-pay

## 2016-08-07 DIAGNOSIS — R531 Weakness: Secondary | ICD-10-CM | POA: Diagnosis present

## 2016-08-07 DIAGNOSIS — I129 Hypertensive chronic kidney disease with stage 1 through stage 4 chronic kidney disease, or unspecified chronic kidney disease: Secondary | ICD-10-CM | POA: Insufficient documentation

## 2016-08-07 DIAGNOSIS — Y939 Activity, unspecified: Secondary | ICD-10-CM | POA: Insufficient documentation

## 2016-08-07 DIAGNOSIS — Y92009 Unspecified place in unspecified non-institutional (private) residence as the place of occurrence of the external cause: Secondary | ICD-10-CM | POA: Insufficient documentation

## 2016-08-07 DIAGNOSIS — M545 Low back pain, unspecified: Secondary | ICD-10-CM

## 2016-08-07 DIAGNOSIS — R930 Abnormal findings on diagnostic imaging of skull and head, not elsewhere classified: Secondary | ICD-10-CM | POA: Insufficient documentation

## 2016-08-07 DIAGNOSIS — Z8507 Personal history of malignant neoplasm of pancreas: Secondary | ICD-10-CM | POA: Insufficient documentation

## 2016-08-07 DIAGNOSIS — N183 Chronic kidney disease, stage 3 (moderate): Secondary | ICD-10-CM | POA: Diagnosis not present

## 2016-08-07 DIAGNOSIS — E1122 Type 2 diabetes mellitus with diabetic chronic kidney disease: Secondary | ICD-10-CM | POA: Diagnosis not present

## 2016-08-07 DIAGNOSIS — Y999 Unspecified external cause status: Secondary | ICD-10-CM | POA: Diagnosis not present

## 2016-08-07 DIAGNOSIS — W19XXXA Unspecified fall, initial encounter: Secondary | ICD-10-CM

## 2016-08-07 DIAGNOSIS — W06XXXA Fall from bed, initial encounter: Secondary | ICD-10-CM | POA: Insufficient documentation

## 2016-08-07 LAB — BASIC METABOLIC PANEL
ANION GAP: 11 (ref 5–15)
BUN: 24 mg/dL — ABNORMAL HIGH (ref 6–20)
CALCIUM: 9.1 mg/dL (ref 8.9–10.3)
CO2: 25 mmol/L (ref 22–32)
Chloride: 101 mmol/L (ref 101–111)
Creatinine, Ser: 1.32 mg/dL — ABNORMAL HIGH (ref 0.44–1.00)
GFR calc Af Amer: 41 mL/min — ABNORMAL LOW (ref >60)
GFR, EST NON AFRICAN AMERICAN: 36 mL/min — AB (ref >60)
GLUCOSE: 134 mg/dL — AB (ref 65–99)
POTASSIUM: 4.3 mmol/L (ref 3.5–5.1)
Sodium: 137 mmol/L (ref 135–145)

## 2016-08-07 LAB — CBC WITH DIFFERENTIAL/PLATELET
BASOS ABS: 0 10*3/uL (ref 0.0–0.1)
BASOS PCT: 0 %
Eosinophils Absolute: 0.2 10*3/uL (ref 0.0–0.7)
Eosinophils Relative: 3 %
HEMATOCRIT: 33 % — AB (ref 36.0–46.0)
HEMOGLOBIN: 10.9 g/dL — AB (ref 12.0–15.0)
Lymphocytes Relative: 27 %
Lymphs Abs: 1.6 10*3/uL (ref 0.7–4.0)
MCH: 29.4 pg (ref 26.0–34.0)
MCHC: 33 g/dL (ref 30.0–36.0)
MCV: 88.9 fL (ref 78.0–100.0)
MONOS PCT: 8 %
Monocytes Absolute: 0.5 10*3/uL (ref 0.1–1.0)
NEUTROS ABS: 3.6 10*3/uL (ref 1.7–7.7)
NEUTROS PCT: 62 %
Platelets: 153 10*3/uL (ref 150–400)
RBC: 3.71 MIL/uL — ABNORMAL LOW (ref 3.87–5.11)
RDW: 15.2 % (ref 11.5–15.5)
WBC: 5.9 10*3/uL (ref 4.0–10.5)

## 2016-08-07 NOTE — ED Notes (Signed)
Pt walked to restroom and was not able to give urine sample.

## 2016-08-07 NOTE — Progress Notes (Signed)
LCSWA met with bedside and discussed patient disposition.LCSWA spoke with potential SNF's and per patient current insurance she will have to pay a copay for room and board. Patient will be discharge from Hospice.Family reports at this time they cannot pay privately for patient care,and want to keep hospice care. LCSWA spoke with Whitney at [facility] and discussed respite care option for patient. She reports the patient will be able to stay at rehab facility for 5 nights and 6 days and have the option to stay longer with a copay. Hospice assist with pt. care and cost for 5 nights. LCSWA explained this option to family. Patient family is agreeable. Patient will be placed in rehab center from home. Whitney plans to meet with the family at 8:30 am to arrange. Patient will return home tonight by [facility]AR.   RN notified of plan.Marland KitchenMarland KitchenRN will update physician.  No other needs identified.   Kathrin Greathouse, Latanya Presser, MSW Clinical Social Worker 5E and Psychiatric Service Line [phone] 08/07/2016  4:58 PM

## 2016-08-07 NOTE — NC FL2 (Signed)
[place] LEVEL OF CARE SCREENING TOOL     IDENTIFICATION  Patient Name: Linda Bentley Birthdate: Sep 04, 1928 Sex: female Admission Date (Current Location): 08/07/2016  [facility] and [place] Number:  Herbalist and Address:  [facility],  [place] [address] [place]      Provider Number: [id number]  Attending Physician Name and Address:  Margette Fast, MD  Relative Name and Phone Number:       Current Level of Care: SNF Recommended Level of Care: [place] Prior Approval Number:    Date Approved/Denied:   PASRR Number:  [id number]   Discharge Plan: SNF    Current Diagnoses: Patient Active Problem List   Diagnosis Date Noted  . Diabetes ([place]) 08/22/2015  . Metabolic acidosis   . Hyperosmolarity due to secondary diabetes ([place]) 07/29/2015  . Gregory (hyperglycemic hyperosmolar nonketotic coma) ([place]) 07/29/2015  . Secondary DM with DKA, uncontrolled ([place]) 07/28/2015  . Pancreatic cancer ([place]) 07/18/2015  . Dehydration 07/18/2015  . Common biliary duct obstruction 06/22/2015  . Abnormal CT scan, pancreas or bile duct 06/22/2015  . Obstructive jaundice 06/22/2015  . Elevated LFTs 06/22/2015  . Dysphagia 10/10/2014  . Headache 10/10/2014  . Counseling regarding end of life decision making 10/10/2014  . Preventative health care 07/11/2014  . Dizziness and giddiness 07/11/2014  . Anemia 01/25/2014  . Urinary incontinence 04/08/2012  . Back pain 04/01/2012  . Restless legs syndrome (RLS) 07/08/2011  . UNSPECIFIED PERIPHERAL VASCULAR DISEASE 06/08/2010  . OSTEOARTHRITIS, KNEE, RIGHT 12/28/2008  . OBESITY 04/14/2008  . Hyperlipidemia 02/26/2007  . Depression 02/26/2007  . Essential hypertension 02/26/2007  . GERD 02/26/2007  . CKD (chronic kidney disease) stage 3, GFR 30-59 ml/min 02/26/2007  . OSTEOPENIA 02/26/2007  . Prediabetes 02/26/2007    Orientation RESPIRATION BLADDER Height & Weight     Self,  Situation, Place  Normal Continent Weight:   Height:     BEHAVIORAL SYMPTOMS/MOOD NEUROLOGICAL BOWEL NUTRITION STATUS      Continent Diet (Normal )  AMBULATORY STATUS COMMUNICATION OF NEEDS Skin   Extensive Assist Verbally Normal                       Personal Care Assistance Level of Assistance  Bathing, Feeding, Dressing Bathing Assistance: Limited assistance Feeding assistance: Independent Dressing Assistance: Limited assistance     Functional Limitations Info  Sight, Hearing, Speech Sight Info: Adequate Hearing Info: Adequate Speech Info: Adequate    SPECIAL CARE FACTORS FREQUENCY  PT (By licensed PT)     PT Frequency: 5              Contractures Contractures Info: Not present    Additional Factors Info  Code Status, Allergies Code Status Info: Prior Allergies Info: Statins, Diltiazem Hcl           Current Medications (08/07/2016):  This is the current hospital active medication list No current facility-administered medications for this encounter.    Current Outpatient Prescriptions  Medication Sig Dispense Refill  . bisacodyl (DULCOLAX) 10 MG suppository Place 10 mg rectally daily as needed for moderate constipation.    . calcium carbonate (TUMS - DOSED IN MG ELEMENTAL CALCIUM) 500 MG chewable tablet Chew 1-2 tablets by mouth daily as needed for indigestion or heartburn.     . citalopram (CELEXA) 20 MG tablet TAKE 1 TABLET BY MOUTH DAILY. (Patient taking differently: TAKE 20mg  TABLET BY MOUTH once at night) 90 tablet 1  .  fluticasone (FLONASE) 50 MCG/ACT nasal spray Place 2 sprays into both nostrils daily. (Patient taking differently: Place 2 sprays into both nostrils daily as needed for allergies. ) 16 g 1  . gabapentin (NEURONTIN) 100 MG capsule Take 100 mg by mouth 3 (three) times daily.    Marland Kitchen labetalol (NORMODYNE) 100 MG tablet TAKE 1 TABLET BY MOUTH DAILY. (Patient taking differently: TAKE 100mg  TABLET BY MOUTH once at night) 90 tablet 0  .  lipase/protease/amylase (CREON) 12000 UNITS CPEP capsule Take 1 capsule (12,000 Units total) by mouth 3 (three) times daily before meals. 270 capsule 1  . LORazepam (ATIVAN) 0.5 MG tablet Take 1 tablet (0.5 mg total) by mouth daily as needed. for anxiety 30 tablet 0  . omeprazole (PRILOSEC) 40 MG capsule TAKE 1 CAPSULE BY MOUTH DAILY. (Patient taking differently: TAKE 40mg  CAPSULE BY MOUTH once at night) 90 capsule 1  . oxybutynin (DITROPAN XL) 15 MG 24 hr tablet Take 15 mg by mouth at bedtime.    . QC ANTI-DIARRHEAL 2 MG tablet Take 2 mg by mouth 4 (four) times daily as needed for diarrhea or loose stools.  0  . QC ARTHRITIS PAIN RELIEF 650 MG CR tablet Take 1,300 mg by mouth daily as needed for pain.  3  . senna-docusate (SENNA S) 8.6-50 MG tablet Take 1 tablet by mouth at bedtime as needed for mild constipation. 20 tablet 0  . traMADol (ULTRAM) 50 MG tablet Take 50 mg by mouth at bedtime.     . trolamine salicylate (ASPERCREME) 10 % cream Apply 1 application topically 2 (two) times daily as needed for muscle pain.    Marland Kitchen atorvastatin (LIPITOR) 40 MG tablet Take 1 tablet (40 mg total) by mouth daily. (Patient not taking: Reported on 08/07/2016) 90 tablet 1  . furosemide (LASIX) 40 MG tablet Take 40 mg by mouth daily.    . insulin aspart (NOVOLOG) 100 UNIT/ML FlexPen Inject 6 Units into the skin 3 (three) times daily with meals. (Patient not taking: Reported on 08/07/2016) 15 mL 11  . Insulin Glargine (LANTUS) 100 UNIT/ML Solostar Pen Inject 18 Units into the skin daily at 10 pm. (Patient not taking: Reported on 08/07/2016) 15 mL 11  . megestrol (MEGACE) 40 MG/ML suspension Take 40 mg by mouth daily.  0  . polyethylene glycol powder (GLYCOLAX/MIRALAX) powder Take 17 g by mouth daily as needed for severe constipation. (Patient not taking: Reported on 08/07/2016) 3350 g 3     Discharge Medications: Please see discharge summary for a list of discharge medications.  Relevant Imaging Results:  Relevant Lab  Results:   Additional Information SSN: [id number]  Lia Hopping, LCSW

## 2016-08-07 NOTE — ED Notes (Addendum)
Per Dr. Laverta [place].  Patient to see social work and PT prior to d/c.  He advised to obtain urine if possible.

## 2016-08-07 NOTE — ED Notes (Signed)
Pt used restroom but contaminated urine sample with toilet paper. Will recollect when pt is able to use restroom again.

## 2016-08-07 NOTE — ED Provider Notes (Signed)
Emergency Department Provider Note   I have reviewed the triage vital signs and the nursing notes.   HISTORY  Chief Complaint Fall (slide out of bed); Weakness (generalized); and HOSPICE   HPI KHLOEY PELLETT is a 83 y.o. female on hospice with active cancer presents to the emergency department for evaluation of mechanical fall in the night. The patient states that she got up around 3 AM and slid to the floor beside her bed. Denies head trauma. She has some pain in her buttocks and was unable to get up off the floor. She did not lose consciousness. She denies any preceding chest pain or shortness of breath. Family at bedside state that her bilateral LE have been swelling. Patient states that she's not been taking her Lasix because it requires should go the bathroom too frequently and she is concerned about falling. She has a history of congestive heart failure. She also notes some coughing but denies dyspnea. She is having some mild lower back pain.  Past Medical History:  Diagnosis Date  . ANEMIA-UNSPECIFIED 07/03/2009  . Depression   . GERD 02/26/2007  . GERD (gastroesophageal reflux disease)   . Hiatal hernia   . HYPERGLYCEMIA 02/26/2007  . Hyperlipidemia   . Hypertension   . LBP (low back pain)   . Osteopenia   . Renal insufficiency 2007   creat 1.3  . UNSPECIFIED PERIPHERAL VASCULAR DISEASE 06/08/2010    Patient Active Problem List   Diagnosis Date Noted  . Diabetes ([place]) 08/22/2015  . Metabolic acidosis   . Hyperosmolarity due to secondary diabetes ([place]) 07/29/2015  . [place] (hyperglycemic hyperosmolar nonketotic coma) ([place]) 07/29/2015  . Secondary DM with DKA, uncontrolled ([place]) 07/28/2015  . Pancreatic cancer ([place]) 07/18/2015  . Dehydration 07/18/2015  . Common biliary duct obstruction 06/22/2015  . Abnormal CT scan, pancreas or bile duct 06/22/2015  . Obstructive jaundice 06/22/2015  . Elevated LFTs 06/22/2015  . Dysphagia 10/10/2014  . Headache 10/10/2014  .  Counseling regarding end of life decision making 10/10/2014  . Preventative health care 07/11/2014  . Dizziness and giddiness 07/11/2014  . Anemia 01/25/2014  . Urinary incontinence 04/08/2012  . Back pain 04/01/2012  . Restless legs syndrome (RLS) 07/08/2011  . UNSPECIFIED PERIPHERAL VASCULAR DISEASE 06/08/2010  . OSTEOARTHRITIS, KNEE, RIGHT 12/28/2008  . OBESITY 04/14/2008  . Hyperlipidemia 02/26/2007  . Depression 02/26/2007  . Essential hypertension 02/26/2007  . GERD 02/26/2007  . CKD (chronic kidney disease) stage 3, GFR 30-59 ml/min 02/26/2007  . OSTEOPENIA 02/26/2007  . Prediabetes 02/26/2007    Past Surgical History:  Procedure Laterality Date  . APPENDECTOMY  1962  . CHOLECYSTECTOMY    . [place]  . ERCP N/A 06/22/2015   Procedure: ENDOSCOPIC RETROGRADE CHOLANGIOPANCREATOGRAPHY (ERCP);  Surgeon: Ladene Artist, MD;  Location: [facility];  Service: Endoscopy;  Laterality: N/A;  . ESOPHAGOGASTRODUODENOSCOPY     with dilatation Dr Fuller Plan  . EUS N/A 07/27/2015   Procedure: UPPER ENDOSCOPIC ULTRASOUND (EUS) RADIAL;  Surgeon: [name], MD;  Location: [facility];  Service: Endoscopy;  Laterality: N/A;  . ingrown toenails      x3  . OOPHORECTOMY  1962  . PARATHYROIDECTOMY    . tail bone surgery    . TONSILLECTOMY AND ADENOIDECTOMY    . WRIST FRACTURE SURGERY  10/01    Current Outpatient Rx  . Order #: [id number] Class: Historical Med  . Order #: [id number] Class: Historical Med  . Order #: [id number] Class: Normal  .  Order #: [id number] Class: Normal  . Order #: [id number] Class: Historical Med  . Order #: [id number] Class: Normal  . Order #: [id number] Class: Normal  . Order #: [id number] Class: No Print  . Order #: [id number] Class: Normal  . Order #: [id number] Class: Historical Med  . Order #: [id number] Class: Historical Med  . Order #: [id number] Class: Historical Med  . Order #: [id number] Class: Normal  . Order #: [id number] Class: Historical  Med  . Order #: [id number] Class: Historical Med  . Order #: [id number] Class: Normal  . Order #: [id number] Class: Historical Med  . Order #: [id number] Class: Normal  . Order #: [id number] Class: Normal  . Order #: [id number] Class: Historical Med  . Order #: [id number] Class: Normal    Allergies Statins and Diltiazem hcl  Family History  Problem Relation Age of Onset  . Kidney disease Father     failure  . Colon cancer Sister     Social History Social History  Substance Use Topics  . Smoking status: Never Smoker  . Smokeless tobacco: Never Used  . Alcohol use No    Review of Systems  Constitutional: No fever/chills. Positive generalized weakness.  Eyes: No visual changes. ENT: No sore throat. Cardiovascular: Denies chest pain. Respiratory: Denies shortness of breath. Gastrointestinal: No abdominal pain.  No nausea, no vomiting.  No diarrhea.  No constipation. Genitourinary: Negative for dysuria. Musculoskeletal: Positive for back pain. Skin: Negative for rash. Neurological: Negative for headaches, focal weakness or numbness.  10-point ROS otherwise negative.  ____________________________________________   PHYSICAL EXAM:  VITAL SIGNS: ED Triage Vitals [08/07/16 1017]  Enc Vitals Group     BP 122/88     Pulse Rate 63     Resp 15     Temp 97.4 F (36.3 C)     Temp Source Oral     SpO2 99 %     Pain Score 1   Constitutional: Alert and oriented. Well appearing and in no acute distress. Eyes: Conjunctivae are normal. PERRL.  Head: Bruising to left forehead. No laceration.  Nose: No congestion/rhinnorhea. Mouth/Throat: Mucous membranes are moist.  Oropharynx non-erythematous. Neck: No stridor. No cervical spine tenderness to palpation. Cardiovascular: Normal rate, regular rhythm. Good peripheral circulation. Grossly normal heart sounds.   Respiratory: Normal respiratory effort.  No retractions. Lungs CTAB. Gastrointestinal: Soft and nontender. No distention.    Musculoskeletal: No lower extremity tenderness. Positive bilateral pitting LE edema. No gross deformities of extremities. Neurologic:  Normal speech and language. No gross focal neurologic deficits are appreciated.  Skin:  Skin is warm, dry and intact. No rash noted.  ____________________________________________   LABS (all labs ordered are listed, but only abnormal results are displayed)  Labs Reviewed  BASIC METABOLIC PANEL - Abnormal; Notable for the following:       Result Value   Glucose, Bld 134 (*)    BUN 24 (*)    Creatinine, Ser 1.32 (*)    GFR calc non Af Amer 36 (*)    GFR calc Af Amer 41 (*)    All other components within normal limits  CBC WITH DIFFERENTIAL/PLATELET - Abnormal; Notable for the following:    RBC 3.71 (*)    Hemoglobin 10.9 (*)    HCT 33.0 (*)    All other components within normal limits  URINALYSIS, ROUTINE W REFLEX MICROSCOPIC   ____________________________________________  RADIOLOGY  Dg Chest 2 View  Result Date: 08/07/2016 CLINICAL DATA:  [place] last night with shortness of breath and dizziness, mid low back pain, history of pancreatic carcinoma EXAM: CHEST  2  VIEW COMPARISON:  Chest x-ray of 03/11/2015, and PET-CT of 07/13/2015 FINDINGS: No active infiltrate or effusion is seen. No pneumothorax is noted. Mediastinal and hilar contours are unremarkable. Mild cardiomegaly is stable. There is mild degenerative change in the lower thoracic spine. No compression deformity is seen. IMPRESSION: No active lung disease.  No acute abnormality. Electronically Signed   By: Ivar Drape M.D.   On: 08/07/2016 12:19   Dg Lumbar Spine 2-3 Views  Result Date: 08/07/2016 CLINICAL DATA:  [place] at home last night, cough, shortness of breath, history of pancreatic carcinoma EXAM: LUMBAR SPINE - 2-3 VIEW COMPARISON:  CT abdomen pelvis of 06/20/2015 and lumbar spine films of 05/08/2015 FINDINGS: A common bile duct stent is present and clips are present from prior  cholecystectomy. The lumbar vertebrae remain in normal alignment. Intervertebral disc spaces appear normal. No compression deformity is seen. The SI joints are unremarkable. IMPRESSION: Normal alignment with normal disc spaces. No acute compression deformity. Electronically Signed   By: Ivar Drape M.D.   On: 08/07/2016 12:21   Ct Head Wo Contrast  Result Date: 08/07/2016 CLINICAL DATA:  Leg weakness for several months, generalized swelling in BILATERAL lower extremities, slid to the floor at 0330 hours and remained on floor since, found this morning by family, no loss of consciousness, history hypertension, stage III chronic kidney disease EXAM: CT HEAD WITHOUT CONTRAST TECHNIQUE: Contiguous axial images were obtained from the base of the skull through the vertex without intravenous contrast. COMPARISON:  05/08/2015 FINDINGS: Brain: Generalized atrophy. Normal ventricular morphology. No midline shift or mass effect. Small vessel chronic ischemic changes of deep cerebral white matter. No intracranial hemorrhage, mass lesion, evidence of acute infarction, or extra-axial fluid collection. Vascular: Atherosclerotic calcifications at the carotid siphons Skull: Osseous demineralization. Hyperostosis frontalis interna. Intact. Sinuses/Orbits: Visualized paranasal sinuses and mastoid air cells clear Other: N/A IMPRESSION: Atrophy with small vessel chronic ischemic changes of deep cerebral white matter. No acute intracranial abnormalities. Electronically Signed   By: Lavonia Dana M.D.   On: 08/07/2016 13:01    ____________________________________________   PROCEDURES  Procedure(s) performed:   Procedures  None ____________________________________________   INITIAL IMPRESSION / ASSESSMENT AND PLAN / ED COURSE  Pertinent labs & imaging results that were available during my care of the patient were reviewed by me and considered in my medical decision making (see chart for details).  Patient presents to  the emergency department for evaluation of mechanical fall at home. The patient is on home hospice but it sounds as if she is having frequent falls at home and family is feeling slightly overwhelmed with the care she is requiring. Patient has bilateral lower extremity edema likely from stopping her Lasix several days ago. Plan for chest x-ray to evaluate for edema in the lungs. We'll obtain CT scan of the head with remote history of head trauma from prior fall with bruising over the forehead and x-ray of the lower back with some tenderness to palpation after this most recent fall. Plan for baseline labs with fatigue to evaluate for anemia and electrolyte abnormality.   02:05 PM Spoke with family and patient regarding ED workup. No clear indication for admission at this time. The patient lives alone in an apartment with family checking in a hospice care 3 times per week. The family does not feel that the patient is ok for discharge in her state and feels that she needs placement. Discussed the process in detail along with goals of care with hospice. Will place  PT consult and discuss placement with social work.   02:52 PM Social work has seen the patient and believes that she will be able to get placement without prior authorization. Patient will need physical therapy evaluation. Social work will begin looking for placement currently and is hopeful that it may be able to happened rather quickly pending unforseen circumstances.   ____________________________________________  FINAL CLINICAL IMPRESSION(S) / ED DIAGNOSES  Final diagnoses:  Lower back pain  Fall, initial encounter     MEDICATIONS GIVEN DURING THIS VISIT:  None  NEW OUTPATIENT MEDICATIONS STARTED DURING THIS VISIT:  None   Note:  This document was prepared using Dragon voice recognition software and may include unintentional dictation errors.  Nanda Quinton, MD Emergency Medicine   Margette Fast, MD 08/07/16 [phone]

## 2016-08-07 NOTE — ED Notes (Signed)
Bed: WHALB Expected date:  Expected time:  Means of arrival:  Comments: 

## 2016-08-07 NOTE — ED Notes (Signed)
Bed: WA11 Expected date:  Expected time:  Means of arrival:  Comments: EMS-fall 

## 2016-08-07 NOTE — Progress Notes (Signed)
Discuss patient disposition and pt. care and help at home. Patient does not meet criteria for residential hospice at this time.  SNF placement may be an option with recommendations from PT if she qualifies. Patient can also get home health equipment or services through [facility].  Social work will continue to follow and assist as needed.   Kathrin Greathouse, Latanya Presser, MSW Clinical Social Worker 5E and Psychiatric Service Line [phone] 08/07/2016  12:22 PM

## 2016-08-07 NOTE — Evaluation (Signed)
Physical Therapy Evaluation Patient Details Name: Linda Bentley MRN: [id number] DOB: 1928/12/18 Today's Date: 08/07/2016   History of Present Illness  83 yo female admitted with fall, weakness. Hx of pancreatic cancer, HTN, CKD. Pt is from home alone  Clinical Impression  On eval, pt required Mod assist for bed mobility and Min assist to stand/ambulate. She walked ~30 feet x 2 (seated rest break in between). Pt participate well with session. Family present during session as well. Pt has had multiple falls recently due to weakness and impaired balance. Recommend ST rehab at SNF prior to returning home, if pt is agreeable.     Follow Up Recommendations SNF    Equipment Recommendations  None recommended by PT    Recommendations for Other Services       Precautions / Restrictions Precautions Precautions: Fall Restrictions Weight Bearing Restrictions: No      Mobility  Bed Mobility Overal bed mobility: Needs Assistance Bed Mobility: Supine to Sit;Sit to Supine     Supine to sit: Mod assist Sit to supine: Mod assist   General bed mobility comments: Assist for bil LEs on and off bed.   Transfers Overall transfer level: Needs assistance Equipment used: Rolling walker (2 wheeled) Transfers: Sit to/from Stand Sit to Stand: Min assist         General transfer comment: Assist to rise, stabilize, control descent, especially from a low surface. VCs safety, technique, hand placement  Ambulation/Gait Ambulation/Gait assistance: Min assist Ambulation Distance (Feet): 30 Feet (x2) Assistive device: Rolling walker (2 wheeled) Gait Pattern/deviations: Step-through pattern;Decreased stride length     General Gait Details: slow gait speed. Intermittent assist to stabilize and maneuver safely with walker.   Stairs            Wheelchair Mobility    Modified Rankin (Stroke Patients Only)       Balance Overall balance assessment: Needs assistance;History of Falls            Standing balance-Leahy Scale: Poor Standing balance comment: requires RW                             Pertinent Vitals/Pain Pain Assessment: Faces Faces Pain Scale: Hurts little more Pain Location: bil LEs with ambulation Pain Descriptors / Indicators: Aching;Sore Pain Intervention(s): Monitored during session    Home Living Family/patient expects to be discharged to:: Private residence Living Arrangements: Alone   Type of Home: Apartment Home Access: Level entry     Home Layout: One level Home Equipment: Other (comment);Walker - 4 wheels;Cane - single point;Tub bench;Hand held shower head (3 wheeled walker)      Prior Function Level of Independence: Independent with assistive device(s);Needs assistance      ADL's / Homemaking Assistance Needed: aides 2x/week for bathing assistance        Hand Dominance        Extremity/Trunk Assessment   Upper Extremity Assessment Upper Extremity Assessment: Generalized weakness    Lower Extremity Assessment Lower Extremity Assessment: Generalized weakness    Cervical / Trunk Assessment Cervical / Trunk Assessment: Kyphotic  Communication   Communication: No difficulties  Cognition Arousal/Alertness: Awake/alert Behavior During Therapy: WFL for tasks assessed/performed Overall Cognitive Status: Within Functional Limits for tasks assessed                      General Comments      Exercises     Assessment/Plan    PT  Assessment Patient needs continued PT services  PT Problem List Decreased strength;Decreased activity tolerance;Decreased balance;Decreased mobility          PT Treatment Interventions DME instruction;Gait training;Therapeutic exercise;Balance training;Functional mobility training;Therapeutic activities;Patient/family education    PT Goals (Current goals can be found in the Care Plan section)  Acute Rehab PT Goals Patient Stated Goal: pt would like to return home.  family feels she needs to get stronger before returning home PT Goal Formulation: With patient/family Time For Goal Achievement: 08/21/16 Potential to Achieve Goals: Good    Frequency Min 3X/week   Barriers to discharge        Co-evaluation               End of Session Equipment Utilized During Treatment: Gait belt Activity Tolerance: Patient tolerated treatment well Patient left: in bed;with family/visitor present      Functional Assessment Tool Used: clinical judgement Functional Limitation: Mobility: Walking and moving around Mobility: Walking and Moving Around Current Status [id number]): At least 1 percent but less than 20 percent impaired, limited or restricted Mobility: Walking and Moving Around Goal Status [phone]): At least 1 percent but less than 20 percent impaired, limited or restricted    Time: 1455-1519 PT Time Calculation (min) (ACUTE ONLY): 24 min   Charges:   PT Evaluation $PT Eval Low Complexity: 1 Procedure     PT G Codes:   PT G-Codes **NOT FOR INPATIENT CLASS** Functional Assessment Tool Used: clinical judgement Functional Limitation: Mobility: Walking and moving around Mobility: Walking and Moving Around Current Status [id number]): At least 1 percent but less than 20 percent impaired, limited or restricted Mobility: Walking and Moving Around Goal Status [phone]): At least 1 percent but less than 20 percent impaired, limited or restricted    Weston Anna, MPT Pager: [phone]

## 2016-08-07 NOTE — ED Triage Notes (Signed)
Per [facility]. HOSPICE- Family called hospice RN and waiting for RN to "show up" however without no response decided to come for evaluation. Weakness to legs for several months. Generalized swelling BLE as cause per pt. Pt "slid to floor" EMS called and evaluated pt. NO transport. Pt "slid to floor at 0330" pt remained on floor since 030. Family found pt this am. NO LOC. Buttocks sore from lying on floor.

## 2016-08-07 NOTE — Progress Notes (Signed)
LCSWA met patient and family at bedside and dicussed patient disposition. Family express concern with patient return home. Family reports patient continues to fall and hurt self, this behavior is unlike her. Family has  requested rehab before returning home.  LCSWA explained SNF process, and provided family with a list.CSW is to follow up with SNF offers for placement.  PT consult in, waiting for PT to see.    Kathrin Greathouse, Latanya Presser, MSW Clinical Social Worker 5E and Psychiatric Service Line [phone] 08/07/2016  2:52 PM

## 2016-08-07 NOTE — ED Notes (Signed)
Social work at bedside.  

## 2016-08-09 ENCOUNTER — Other Ambulatory Visit: Payer: Self-pay | Admitting: Nurse Practitioner

## 2016-08-16 ENCOUNTER — Encounter (HOSPITAL_COMMUNITY): Payer: Self-pay | Admitting: Emergency Medicine

## 2016-08-16 ENCOUNTER — Emergency Department (HOSPITAL_COMMUNITY)

## 2016-08-16 ENCOUNTER — Inpatient Hospital Stay (HOSPITAL_COMMUNITY)
Admission: EM | Admit: 2016-08-16 | Discharge: 2016-08-20 | DRG: 683 | Disposition: A | Discharge status: Skilled Nursing Facility | Attending: Family Medicine | Admitting: Family Medicine

## 2016-08-16 DIAGNOSIS — R296 Repeated falls: Secondary | ICD-10-CM

## 2016-08-16 DIAGNOSIS — K529 Noninfective gastroenteritis and colitis, unspecified: Secondary | ICD-10-CM | POA: Diagnosis present

## 2016-08-16 DIAGNOSIS — Z66 Do not resuscitate: Secondary | ICD-10-CM | POA: Diagnosis present

## 2016-08-16 DIAGNOSIS — M6281 Muscle weakness (generalized): Secondary | ICD-10-CM

## 2016-08-16 DIAGNOSIS — I10 Essential (primary) hypertension: Secondary | ICD-10-CM | POA: Diagnosis present

## 2016-08-16 DIAGNOSIS — K219 Gastro-esophageal reflux disease without esophagitis: Secondary | ICD-10-CM | POA: Diagnosis present

## 2016-08-16 DIAGNOSIS — Z9181 History of falling: Secondary | ICD-10-CM

## 2016-08-16 DIAGNOSIS — Z9049 Acquired absence of other specified parts of digestive tract: Secondary | ICD-10-CM

## 2016-08-16 DIAGNOSIS — E860 Dehydration: Secondary | ICD-10-CM | POA: Diagnosis present

## 2016-08-16 DIAGNOSIS — E785 Hyperlipidemia, unspecified: Secondary | ICD-10-CM | POA: Diagnosis present

## 2016-08-16 DIAGNOSIS — N179 Acute kidney failure, unspecified: Principal | ICD-10-CM | POA: Diagnosis present

## 2016-08-16 DIAGNOSIS — F32A Depression, unspecified: Secondary | ICD-10-CM

## 2016-08-16 DIAGNOSIS — E1122 Type 2 diabetes mellitus with diabetic chronic kidney disease: Secondary | ICD-10-CM | POA: Diagnosis present

## 2016-08-16 DIAGNOSIS — E119 Type 2 diabetes mellitus without complications: Secondary | ICD-10-CM

## 2016-08-16 DIAGNOSIS — S7002XA Contusion of left hip, initial encounter: Secondary | ICD-10-CM | POA: Diagnosis present

## 2016-08-16 DIAGNOSIS — N183 Chronic kidney disease, stage 3 unspecified: Secondary | ICD-10-CM | POA: Diagnosis present

## 2016-08-16 DIAGNOSIS — Z79899 Other long term (current) drug therapy: Secondary | ICD-10-CM

## 2016-08-16 DIAGNOSIS — F329 Major depressive disorder, single episode, unspecified: Secondary | ICD-10-CM | POA: Diagnosis present

## 2016-08-16 DIAGNOSIS — D649 Anemia, unspecified: Secondary | ICD-10-CM | POA: Diagnosis present

## 2016-08-16 DIAGNOSIS — W19XXXA Unspecified fall, initial encounter: Secondary | ICD-10-CM | POA: Diagnosis present

## 2016-08-16 DIAGNOSIS — Y92009 Unspecified place in unspecified non-institutional (private) residence as the place of occurrence of the external cause: Secondary | ICD-10-CM

## 2016-08-16 DIAGNOSIS — R197 Diarrhea, unspecified: Secondary | ICD-10-CM

## 2016-08-16 DIAGNOSIS — M25552 Pain in left hip: Secondary | ICD-10-CM

## 2016-08-16 DIAGNOSIS — E1151 Type 2 diabetes mellitus with diabetic peripheral angiopathy without gangrene: Secondary | ICD-10-CM | POA: Diagnosis present

## 2016-08-16 DIAGNOSIS — C259 Malignant neoplasm of pancreas, unspecified: Secondary | ICD-10-CM | POA: Diagnosis present

## 2016-08-16 DIAGNOSIS — I959 Hypotension, unspecified: Secondary | ICD-10-CM | POA: Diagnosis present

## 2016-08-16 DIAGNOSIS — Z800 Family history of malignant neoplasm of digestive organs: Secondary | ICD-10-CM

## 2016-08-16 DIAGNOSIS — R531 Weakness: Secondary | ICD-10-CM | POA: Diagnosis not present

## 2016-08-16 DIAGNOSIS — I129 Hypertensive chronic kidney disease with stage 1 through stage 4 chronic kidney disease, or unspecified chronic kidney disease: Secondary | ICD-10-CM | POA: Diagnosis present

## 2016-08-16 MED ORDER — SODIUM CHLORIDE 0.9 % IV BOLUS (SEPSIS)
500.0000 mL | Freq: Once | INTRAVENOUS | Status: AC
  Administered 2016-08-17: 500 mL via INTRAVENOUS

## 2016-08-16 NOTE — ED Triage Notes (Signed)
Per EMS, pt comes from home. Pt slid out of chair and noticed progressing weakness and felt the need to call EMS.   Pt denies pain and there are no apparent injuries. Pt is a&o x 4 & ambulatory with walker.  BP:  108/64 HR: 60 RR: 16 CBG: 132

## 2016-08-16 NOTE — ED Notes (Signed)
Bed: WA10 Expected date:  Expected time:  Means of arrival:  Comments: Triage 1   

## 2016-08-16 NOTE — ED Provider Notes (Addendum)
[facility] DEPT Provider Note   CSN: [id number] Arrival date & time: 08/16/16  2230  By signing my name below, I, Gwenlyn Fudge, attest that this documentation has been prepared under the direction and in the presence of Elnora Morrison, MD. Electronically Signed: Gwenlyn Fudge, ED Scribe. 08/17/16. 12:01 AM.   History   Chief Complaint Chief Complaint  Patient presents with  . Fall   The history is provided by the patient and a relative. No language interpreter was used.   HPI Comments: Linda Bentley is a 83 y.o. female with PMHx of Pancreatic cancer, HTN, and HLD who presents to the Emergency Department complaining of gradual onset, progressively worsening, constant bilateral lower extremity weakness for 3 months. Earlier tonight, pt fell forward onto a table and require EMS assistance to get up. Pt has recent hx of multiple falls, but tonight she denies any head injury or loss of consciousness. She lives at home alone. Pt receives hospice care on Monday, Wednesday, and Thursday for approximately 1 hour each day. She has been experiencing associated diarrhea, intermittent generalized muscle tension and increased frequency. She has not taken any antibiotics recently. She has no hx of C. Diff. Pt has refused radiation therapy for pancreatic cancer. She denies fever, chills, abdominal pain, neck pain, and increased back pain.  Past Medical History:  Diagnosis Date  . ANEMIA-UNSPECIFIED 07/03/2009  . Depression   . GERD 02/26/2007  . GERD (gastroesophageal reflux disease)   . Hiatal hernia   . HYPERGLYCEMIA 02/26/2007  . Hyperlipidemia   . Hypertension   . LBP (low back pain)   . Osteopenia   . Renal insufficiency 2007   creat 1.3  . UNSPECIFIED PERIPHERAL VASCULAR DISEASE 06/08/2010    Patient Active Problem List   Diagnosis Date Noted  . ARF (acute renal failure) ([place]) 08/17/2016  . Diabetes ([place]) 08/22/2015  . Metabolic acidosis   . Hyperosmolarity due to secondary diabetes  ([place]) 07/29/2015  . [place] (hyperglycemic hyperosmolar nonketotic coma) ([place]) 07/29/2015  . Secondary DM with DKA, uncontrolled ([place]) 07/28/2015  . Pancreatic cancer ([place]) 07/18/2015  . Dehydration 07/18/2015  . Common biliary duct obstruction 06/22/2015  . Abnormal CT scan, pancreas or bile duct 06/22/2015  . Obstructive jaundice 06/22/2015  . Elevated LFTs 06/22/2015  . Dysphagia 10/10/2014  . Headache 10/10/2014  . Counseling regarding end of life decision making 10/10/2014  . Preventative health care 07/11/2014  . Dizziness and giddiness 07/11/2014  . Anemia 01/25/2014  . Urinary incontinence 04/08/2012  . Back pain 04/01/2012  . Restless legs syndrome (RLS) 07/08/2011  . UNSPECIFIED PERIPHERAL VASCULAR DISEASE 06/08/2010  . OSTEOARTHRITIS, KNEE, RIGHT 12/28/2008  . OBESITY 04/14/2008  . Hyperlipidemia 02/26/2007  . Depression 02/26/2007  . Essential hypertension 02/26/2007  . GERD 02/26/2007  . CKD (chronic kidney disease) stage 3, GFR 30-59 ml/min 02/26/2007  . OSTEOPENIA 02/26/2007  . Prediabetes 02/26/2007    Past Surgical History:  Procedure Laterality Date  . APPENDECTOMY  1962  . CHOLECYSTECTOMY    . [place]  . ERCP N/A 06/22/2015   Procedure: ENDOSCOPIC RETROGRADE CHOLANGIOPANCREATOGRAPHY (ERCP);  Surgeon: Ladene Artist, MD;  Location: [facility];  Service: Endoscopy;  Laterality: N/A;  . ESOPHAGOGASTRODUODENOSCOPY     with dilatation Dr Fuller Plan  . EUS N/A 07/27/2015   Procedure: UPPER ENDOSCOPIC ULTRASOUND (EUS) RADIAL;  Surgeon: [name], MD;  Location: [facility];  Service: Endoscopy;  Laterality: N/A;  . ingrown toenails  x3  . OOPHORECTOMY  1962  . PARATHYROIDECTOMY    . tail bone surgery    . TONSILLECTOMY AND ADENOIDECTOMY    . WRIST FRACTURE SURGERY  10/01    OB History    No data available       Home Medications    Prior to Admission medications   Medication Sig Start Date End Date Taking?  Authorizing Provider  calcium carbonate (TUMS - DOSED IN MG ELEMENTAL CALCIUM) 500 MG chewable tablet Chew 1-2 tablets by mouth daily as needed for indigestion or heartburn.    Yes Historical Provider, MD  citalopram (CELEXA) 20 MG tablet TAKE 1 TABLET BY MOUTH DAILY. Patient taking differently: TAKE 20mg  TABLET BY MOUTH once at night 06/02/15  Yes Doe-Hyun R Shawna Orleans, DO  furosemide (LASIX) 40 MG tablet Take 40 mg by mouth daily.   Yes Historical Provider, MD  gabapentin (NEURONTIN) 100 MG capsule Take 100 mg by mouth 3 (three) times daily.   Yes Historical Provider, MD  labetalol (NORMODYNE) 100 MG tablet TAKE 1 TABLET BY MOUTH DAILY. Patient taking differently: TAKE 100mg  TABLET BY MOUTH once at night 12/07/15  Yes Doe-Hyun R Shawna Orleans, DO  lipase/protease/amylase (CREON) 12000 UNITS CPEP capsule Take 1 capsule (12,000 Units total) by mouth 3 (three) times daily before meals. 07/04/15  Yes Brunetta Genera, MD  LORazepam (ATIVAN) 0.5 MG tablet Take 1 tablet (0.5 mg total) by mouth daily as needed. for anxiety Patient taking differently: Take 0.5 mg by mouth at bedtime.  08/29/15  Yes Gerlene Fee, NP  megestrol (MEGACE) 40 MG/ML suspension Take 40 mg by mouth daily. 07/16/16  Yes Historical Provider, MD  omeprazole (PRILOSEC) 40 MG capsule TAKE 1 CAPSULE BY MOUTH DAILY. Patient taking differently: TAKE 40mg  CAPSULE BY MOUTH once at night 06/02/15  Yes Doe-Hyun R Shawna Orleans, DO  oxybutynin (DITROPAN XL) 15 MG 24 hr tablet Take 15 mg by mouth at bedtime.   Yes Historical Provider, MD  QC ANTI-DIARRHEAL 2 MG tablet Take 2 mg by mouth 4 (four) times daily as needed for diarrhea or loose stools. 06/19/16  Yes Historical Provider, MD  QC ARTHRITIS PAIN RELIEF 650 MG CR tablet Take 1,300 mg by mouth daily as needed for pain. 06/26/16  Yes Historical Provider, MD  traMADol (ULTRAM) 50 MG tablet Take 50 mg by mouth at bedtime.    Yes Historical Provider, MD  trolamine salicylate (ASPERCREME) 10 % cream Apply 1  application topically 2 (two) times daily as needed for muscle pain.   Yes Historical Provider, MD  insulin aspart (NOVOLOG) 100 UNIT/ML FlexPen Inject 6 Units into the skin 3 (three) times daily with meals. Patient not taking: Reported on 08/17/2016 08/22/15   Brunetta Genera, MD  Insulin Glargine (LANTUS) 100 UNIT/ML Solostar Pen Inject 18 Units into the skin daily at 10 pm. Patient not taking: Reported on 08/17/2016 08/22/15   Brunetta Genera, MD   Family History Family History  Problem Relation Age of Onset  . Kidney disease Father     failure  . Colon cancer Sister     Social History Social History  Substance Use Topics  . Smoking status: Never Smoker  . Smokeless tobacco: Never Used  . Alcohol use No    Allergies   Statins and Diltiazem hcl  Review of Systems Review of Systems  Constitutional: Negative for chills and fever.  Gastrointestinal: Positive for diarrhea. Negative for abdominal pain.  Genitourinary: Positive for frequency.  Musculoskeletal: Negative for back pain  and neck pain.  Neurological: Positive for weakness. Negative for syncope.  All other systems reviewed and are negative.  Physical Exam Updated Vital Signs BP (!) 121/38   Pulse 63   Temp 97.9 F (36.6 C) (Oral)   Resp 17   Ht 5\' 1"  (1.549 m)   SpO2 96%   Physical Exam  Constitutional: She is oriented to person, place, and time. She appears well-developed and well-nourished. She is active. No distress.  HENT:  Head: Normocephalic.  Mild ecchymosis to lateral forehead Head is not atraumatic Dry mm  Eyes: Conjunctivae and EOM are normal.  Cardiovascular: Normal rate and regular rhythm.   Pulmonary/Chest: Effort normal and breath sounds normal. No respiratory distress. She has no wheezes. She has no rales.  Anterior lung fields are clear  Abdominal: Soft. There is no tenderness (no focal).  Musculoskeletal: Normal range of motion.  No tenderness to major joints of upper  extremities Equal strength upper extremities bilaterally  Mild discomfort to the left hip Edema to left lower extremity No pain in midline, thoracic or lumbar spine Minimal swelling in both legs  Neurological: She is alert and oriented to person, place, and time.  Mild weakness in right lower extremity compared to left lower extremity  Skin: Skin is warm and dry.  Psychiatric: She has a normal mood and affect. Her behavior is normal.  Nursing note and vitals reviewed.  ED Treatments / Results  DIAGNOSTIC STUDIES: Oxygen Saturation is 96% on RA, adequate by my interpretation.    COORDINATION OF CARE: 12:01 AM Discussed treatment plan with pt at bedside which includes Hip XR, blood work and urinalysis and pt agreed to plan.  Labs (all labs ordered are listed, but only abnormal results are displayed) Labs Reviewed  CBC WITH DIFFERENTIAL/PLATELET - Abnormal; Notable for the following:       Result Value   RBC 3.26 (*)    Hemoglobin 9.3 (*)    HCT 28.2 (*)    RDW 15.8 (*)    Platelets 135 (*)    All other components within normal limits  BASIC METABOLIC PANEL - Abnormal; Notable for the following:    Glucose, Bld 104 (*)    BUN 50 (*)    Creatinine, Ser 3.75 (*)    Calcium 8.5 (*)    GFR calc non Af Amer 10 (*)    GFR calc Af Amer 12 (*)    All other components within normal limits  C DIFFICILE QUICK SCREEN W PCR REFLEX  STOOL CULTURE  URINALYSIS, ROUTINE W REFLEX MICROSCOPIC  I-STAT CG4 LACTIC ACID, ED    EKG  EKG Interpretation None      Radiology Dg Hip Unilat W Or Wo Pelvis 2-3 Views Left  Result Date: 08/17/2016 CLINICAL DATA:  Left hip pain after a fall. History of lower extremity weakness for 3 months. Multiple previous falls. History of pancreatic cancer. EXAM: DG HIP (WITH OR WITHOUT PELVIS) 2-3V LEFT COMPARISON:  None. FINDINGS: No evidence of acute fracture or dislocation of the left hip. Pelvis and visualized sacrum appear intact. No focal bone lesion or  bone destruction. SI joints and symphysis pubis are not displaced. Degenerative changes in the lower lumbar spine and in both hips. There is suggestion of infiltration in the subcutaneous fat lateral to the left hip probably representing hematoma. IMPRESSION: No acute bony abnormalities. Degenerative changes. Probable hematoma in the soft tissues lateral to the left hip. Electronically Signed   By: Oren Beckmann.D.  On: 08/17/2016 00:18    Procedures Procedures (including critical care time)  Medications Ordered in ED Medications  sodium chloride 0.9 % bolus 1,000 mL (not administered)  sodium chloride 0.9 % bolus 500 mL (500 mLs Intravenous New Bag/Given 08/17/16 0044)     Initial Impression / Assessment and Plan / ED Course  I have reviewed the triage vital signs and the nursing notes.  Pertinent labs & imaging results that were available during my care of the patient were reviewed by me and considered in my medical decision making (see chart for details).  Clinical Course   Patient presents with increased frequent falls and general weakness. Patient has hospice to help guide her care patient does want to comanagement. Patient clinically dehydrated on exam. IV fluid boluses given blood work consistent with acute renal failure. IV fluid bolus repeat [facility] with [facility]. X-ray no acute fracture.  The patients results and plan were reviewed and discussed.   Any x-rays performed were independently reviewed by myself.   Differential diagnosis were considered with the presenting HPI.  Medications  sodium chloride 0.9 % bolus 1,000 mL (not administered)  sodium chloride 0.9 % bolus 500 mL (500 mLs Intravenous New Bag/Given 08/17/16 0044)    Vitals:   08/16/16 2240 08/16/16 2300 08/16/16 2330 08/17/16 0000  BP:  (!) 113/44 (!) 108/40 (!) 121/38  Pulse:  (!) 59 (!) 59 63  Resp:  15 19 17   Temp:      TempSrc:      SpO2:  95% 94% 96%  Height: 5\' 1"  (1.549 m)        Final diagnoses:  Acute renal failure, unspecified acute renal failure type (HCC)  Dehydration  Frequent falls  Left hip pain  Diarrhea, unspecified type    Admission/ observation were discussed with the admitting physician, patient and/or family and they are comfortable with the plan.    Final Clinical Impressions(s) / ED Diagnoses   Final diagnoses:  Acute renal failure, unspecified acute renal failure type ([place])  Dehydration  Frequent falls  Left hip pain  Diarrhea, unspecified type    New Prescriptions New Prescriptions   No medications on file      Elnora Morrison, MD 08/17/16 [id number]    Elnora Morrison, MD 08/17/16 [phone]

## 2016-08-17 ENCOUNTER — Emergency Department (HOSPITAL_COMMUNITY)

## 2016-08-17 ENCOUNTER — Encounter (HOSPITAL_COMMUNITY): Payer: Self-pay | Admitting: Family Medicine

## 2016-08-17 DIAGNOSIS — K529 Noninfective gastroenteritis and colitis, unspecified: Secondary | ICD-10-CM

## 2016-08-17 DIAGNOSIS — C250 Malignant neoplasm of head of pancreas: Secondary | ICD-10-CM

## 2016-08-17 DIAGNOSIS — N183 Chronic kidney disease, stage 3 (moderate): Secondary | ICD-10-CM

## 2016-08-17 DIAGNOSIS — D649 Anemia, unspecified: Secondary | ICD-10-CM | POA: Diagnosis not present

## 2016-08-17 DIAGNOSIS — N179 Acute kidney failure, unspecified: Secondary | ICD-10-CM | POA: Diagnosis not present

## 2016-08-17 DIAGNOSIS — S7002XA Contusion of left hip, initial encounter: Secondary | ICD-10-CM | POA: Diagnosis not present

## 2016-08-17 LAB — CBC
HCT: 26.7 % — ABNORMAL LOW (ref 36.0–46.0)
Hemoglobin: 8.8 g/dL — ABNORMAL LOW (ref 12.0–15.0)
MCH: 28.9 pg (ref 26.0–34.0)
MCHC: 33 g/dL (ref 30.0–36.0)
MCV: 87.8 fL (ref 78.0–100.0)
Platelets: 136 10*3/uL — ABNORMAL LOW (ref 150–400)
RBC: 3.04 MIL/uL — AB (ref 3.87–5.11)
RDW: 15.8 % — ABNORMAL HIGH (ref 11.5–15.5)
WBC: 4.9 10*3/uL (ref 4.0–10.5)

## 2016-08-17 LAB — BASIC METABOLIC PANEL
ANION GAP: 10 (ref 5–15)
Anion gap: 9 (ref 5–15)
BUN: 50 mg/dL — ABNORMAL HIGH (ref 6–20)
BUN: 50 mg/dL — ABNORMAL HIGH (ref 6–20)
CHLORIDE: 110 mmol/L (ref 101–111)
CO2: 24 mmol/L (ref 22–32)
CO2: 24 mmol/L (ref 22–32)
Calcium: 8.1 mg/dL — ABNORMAL LOW (ref 8.9–10.3)
Calcium: 8.5 mg/dL — ABNORMAL LOW (ref 8.9–10.3)
Chloride: 110 mmol/L (ref 101–111)
Creatinine, Ser: 3.6 mg/dL — ABNORMAL HIGH (ref 0.44–1.00)
Creatinine, Ser: 3.75 mg/dL — ABNORMAL HIGH (ref 0.44–1.00)
GFR calc non Af Amer: 11 mL/min — ABNORMAL LOW (ref >60)
GFR, EST AFRICAN AMERICAN: 12 mL/min — AB (ref >60)
GFR, EST AFRICAN AMERICAN: 12 mL/min — AB (ref >60)
GFR, EST NON AFRICAN AMERICAN: 10 mL/min — AB (ref >60)
GLUCOSE: 104 mg/dL — AB (ref 65–99)
Glucose, Bld: 99 mg/dL (ref 65–99)
POTASSIUM: 3.8 mmol/L (ref 3.5–5.1)
POTASSIUM: 3.9 mmol/L (ref 3.5–5.1)
SODIUM: 143 mmol/L (ref 135–145)
Sodium: 144 mmol/L (ref 135–145)

## 2016-08-17 LAB — CBC WITH DIFFERENTIAL/PLATELET
BASOS ABS: 0 10*3/uL (ref 0.0–0.1)
BASOS PCT: 0 %
Eosinophils Absolute: 0.2 10*3/uL (ref 0.0–0.7)
Eosinophils Relative: 4 %
HEMATOCRIT: 28.2 % — AB (ref 36.0–46.0)
Hemoglobin: 9.3 g/dL — ABNORMAL LOW (ref 12.0–15.0)
LYMPHS PCT: 36 %
Lymphs Abs: 2 10*3/uL (ref 0.7–4.0)
MCH: 28.5 pg (ref 26.0–34.0)
MCHC: 33 g/dL (ref 30.0–36.0)
MCV: 86.5 fL (ref 78.0–100.0)
Monocytes Absolute: 0.5 10*3/uL (ref 0.1–1.0)
Monocytes Relative: 9 %
NEUTROS ABS: 2.7 10*3/uL (ref 1.7–7.7)
Neutrophils Relative %: 51 %
Platelets: 135 10*3/uL — ABNORMAL LOW (ref 150–400)
RBC: 3.26 MIL/uL — AB (ref 3.87–5.11)
RDW: 15.8 % — ABNORMAL HIGH (ref 11.5–15.5)
WBC: 5.4 10*3/uL (ref 4.0–10.5)

## 2016-08-17 LAB — URINALYSIS, ROUTINE W REFLEX MICROSCOPIC
Bilirubin Urine: NEGATIVE
Glucose, UA: NEGATIVE mg/dL
Hgb urine dipstick: NEGATIVE
KETONES UR: NEGATIVE mg/dL
LEUKOCYTES UA: NEGATIVE
NITRITE: NEGATIVE
PROTEIN: NEGATIVE mg/dL
Specific Gravity, Urine: 1.017 (ref 1.005–1.030)
pH: 5 (ref 5.0–8.0)

## 2016-08-17 LAB — HEMOGLOBIN AND HEMATOCRIT, BLOOD
HCT: 26.7 % — ABNORMAL LOW (ref 36.0–46.0)
HEMATOCRIT: 28.6 % — AB (ref 36.0–46.0)
HEMATOCRIT: 28.5 % — AB (ref 36.0–46.0)
HEMOGLOBIN: 9.3 g/dL — AB (ref 12.0–15.0)
HEMOGLOBIN: 9.3 g/dL — AB (ref 12.0–15.0)
HEMOGLOBIN: 8.8 g/dL — AB (ref 12.0–15.0)

## 2016-08-17 LAB — TYPE AND SCREEN
ABO/RH(D): A POS
Antibody Screen: NEGATIVE

## 2016-08-17 LAB — GLUCOSE, CAPILLARY
Glucose-Capillary: 94 mg/dL (ref 65–99)
Glucose-Capillary: 134 mg/dL — ABNORMAL HIGH (ref 65–99)
Glucose-Capillary: 117 mg/dL — ABNORMAL HIGH (ref 65–99)
Glucose-Capillary: 139 mg/dL — ABNORMAL HIGH (ref 65–99)

## 2016-08-17 LAB — I-STAT CG4 LACTIC ACID, ED: LACTIC ACID, VENOUS: 1.39 mmol/L (ref 0.5–1.9)

## 2016-08-17 LAB — ABO/RH: ABO/RH(D): A POS

## 2016-08-17 MED ORDER — LORAZEPAM 0.5 MG PO TABS
0.5000 mg | ORAL_TABLET | Freq: Every day | ORAL | Status: DC
  Administered 2016-08-17 – 2016-08-19 (×3): 0.5 mg via ORAL
  Filled 2016-08-17 (×3): qty 1

## 2016-08-17 MED ORDER — ONDANSETRON HCL 4 MG PO TABS: 4.0000 mg | ORAL_TABLET | Freq: Four times a day (QID) | ORAL | Status: DC | PRN

## 2016-08-17 MED ORDER — OXYBUTYNIN CHLORIDE ER 5 MG PO TB24
15.0000 mg | ORAL_TABLET | Freq: Every day | ORAL | Status: DC
  Administered 2016-08-17 – 2016-08-19 (×3): 15 mg via ORAL
  Filled 2016-08-17 (×3): qty 3

## 2016-08-17 MED ORDER — GABAPENTIN 100 MG PO CAPS
100.0000 mg | ORAL_CAPSULE | Freq: Three times a day (TID) | ORAL | Status: DC
  Administered 2016-08-17 – 2016-08-20 (×10): 100 mg via ORAL
  Filled 2016-08-17 (×10): qty 1

## 2016-08-17 MED ORDER — MEGESTROL ACETATE 40 MG/ML PO SUSP
40.0000 mg | Freq: Every day | ORAL | Status: DC
  Administered 2016-08-17 – 2016-08-20 (×4): 40 mg via ORAL
  Filled 2016-08-17 (×4): qty 5

## 2016-08-17 MED ORDER — SODIUM CHLORIDE 0.9 % IV SOLN
INTRAVENOUS | Status: DC
  Administered 2016-08-17 – 2016-08-20 (×5): via INTRAVENOUS

## 2016-08-17 MED ORDER — ACETAMINOPHEN 325 MG PO TABS: 650.0000 mg | ORAL_TABLET | Freq: Four times a day (QID) | ORAL | Status: DC | PRN

## 2016-08-17 MED ORDER — INSULIN ASPART 100 UNIT/ML ~~LOC~~ SOLN
0.0000 [IU] | Freq: Three times a day (TID) | SUBCUTANEOUS | Status: DC
  Administered 2016-08-17 – 2016-08-18 (×2): 1 [IU] via SUBCUTANEOUS
  Administered 2016-08-19: 2 [IU] via SUBCUTANEOUS
  Administered 2016-08-20: 1 [IU] via SUBCUTANEOUS

## 2016-08-17 MED ORDER — PANTOPRAZOLE SODIUM 40 MG PO TBEC
40.0000 mg | DELAYED_RELEASE_TABLET | Freq: Every day | ORAL | Status: DC
  Administered 2016-08-17 – 2016-08-20 (×4): 40 mg via ORAL
  Filled 2016-08-17 (×4): qty 1

## 2016-08-17 MED ORDER — PANCRELIPASE (LIP-PROT-AMYL) 12000-38000 UNITS PO CPEP
12000.0000 [IU] | ORAL_CAPSULE | Freq: Three times a day (TID) | ORAL | Status: DC
  Administered 2016-08-17 – 2016-08-20 (×11): 12000 [IU] via ORAL
  Filled 2016-08-17 (×11): qty 1

## 2016-08-17 MED ORDER — TRAMADOL HCL 50 MG PO TABS
50.0000 mg | ORAL_TABLET | Freq: Every day | ORAL | Status: DC
  Administered 2016-08-17 – 2016-08-19 (×3): 50 mg via ORAL
  Filled 2016-08-17 (×3): qty 1

## 2016-08-17 MED ORDER — ONDANSETRON HCL 4 MG/2ML IJ SOLN
4.0000 mg | Freq: Four times a day (QID) | INTRAMUSCULAR | Status: DC | PRN
  Filled 2016-08-17: qty 2

## 2016-08-17 MED ORDER — SODIUM CHLORIDE 0.9 % IV BOLUS (SEPSIS)
1000.0000 mL | Freq: Once | INTRAVENOUS | Status: AC
  Administered 2016-08-17: 1000 mL via INTRAVENOUS

## 2016-08-17 MED ORDER — CITALOPRAM HYDROBROMIDE 20 MG PO TABS
20.0000 mg | ORAL_TABLET | Freq: Every day | ORAL | Status: DC
  Administered 2016-08-17 – 2016-08-20 (×4): 20 mg via ORAL
  Filled 2016-08-17 (×4): qty 1

## 2016-08-17 MED ORDER — LABETALOL HCL 100 MG PO TABS
100.0000 mg | ORAL_TABLET | Freq: Every day | ORAL | Status: DC
  Administered 2016-08-17 – 2016-08-18 (×2): 100 mg via ORAL
  Filled 2016-08-17 (×2): qty 1

## 2016-08-17 MED ORDER — ACETAMINOPHEN 650 MG RE SUPP: 650.0000 mg | Freq: Four times a day (QID) | RECTAL | Status: DC | PRN

## 2016-08-17 NOTE — ED Notes (Signed)
Pt. In xray 

## 2016-08-17 NOTE — Progress Notes (Signed)
Progress Note:   Linda Bentley is a 83 y.o. female with pancreatic CA on home hospice and stage III CKD, HTN, HLD, and PVD who was admitted early this morning for left hip hematoma due to unwitnessed fall and AKI. Creatinine 3.75 on admission, from baseline ~1.3. Left hip CT without fracture, but showing probable hematoma. She's been given IV fluids. BP has remained low and hemoglobin has remained stable (9.3 > 8.8 > 9.3).  BP 112/44  HR 82   Temp 98 F  Resp 16 Elderly F resting quietly in no distress. Pleasant when awoken.  RRR, no murmur. 2+ pitting, symmetrical LE edema. Nonlabored, 100% on room air, clear brath sounds Left hip with ~3cm diameter round ecchymosis overlying greater trochanter not tender to palpation. Hip with full ROM. Compartment soft.   Hemoglobin stable x10 hours. Will recheck later this PM and tomorrow if stable. Continue IVF, holding lasix, and repeat BMP tomorrow. If improving, may be discharged. PT evaluation ordered.   Vance Gather, MD 08/17/2016 1:51 PM

## 2016-08-17 NOTE — Care Management (Signed)
[facility] - GIP RN Visit  This is a related, covered GIP admission per [facility].  She was admitted to [facility] on 08/22/15 with Pancreatic CA.  Patient is a DNR.  She was admitted to [facility] after sustaining a fall at home.  She reported that she had been experiencing increasing bilateral lower extremity weakness.  She also notes that she has been having intermittent diarrhea.  Admission date of 08/17/16 for Acute Kidney Injury.  BUN 50 and Creatinine 37.5.  She received 1.5 L IV fluids in ED and is currently receiving 75 ml qh 0.9% NaCl.  Zacarias Pontes RN Caren Griffins is aware of my visit on today.    Please call with any hospice related questions or concerns  Thank you Claire Shown. Mingo Amber RN [facility] On Call RN [phone]

## 2016-08-17 NOTE — ED Notes (Signed)
No BM since 11pm.

## 2016-08-17 NOTE — H&P (Signed)
History and Physical    Linda Bentley [id number] DOB: 1928-08-11 DOA: 08/16/2016  PCP: Drema Pry, DO   Patient coming from: Home  Chief Complaint: Weakness, recurrent diarrhea  HPI: Linda Bentley is a 83 y.o. woman with a history of pancreatic cancer (she is on hospice), PVD, CKD 3, HTN, HLD, and GERD who presented to the ED for evaluation after an unwitnessed fall at home.  Per the patient, she has had intermittent diarrhea since October.  She has taken an anti-diarrheal as needed.  Stools are semi-solid and yellow; they are not watery or bloody.  No associated abdominal pain.  No fevers.  No nausea or vomiting.  She has had progressive weakness, and she had an unwitnessed fall in her apartment.  She had to call maintenance workers to help her get up.  They called 911, and patient was brought to the ED for further evaluation.  She denies head trauma.  No headache.  No pain in her back or hips.  No chest pain or shortness of breath.  She has had some light-headedness.    ED Course: Labs show AKI with BUN 50 and creatinine 3.75, baseline around 1.3.  Hemoglobin down slightly at 9.  EKG shows NSR, no acute ST segment changes.  CT of the left hip shows probable hematoma of the lateral left hip.  The patient has received 1.5L of NS in the ED.  Hospitalist asked to place in observation.  Review of Systems: As per HPI otherwise 10 point review of systems negative.    Past Medical History:  Diagnosis Date  . ANEMIA-UNSPECIFIED 07/03/2009  . Depression   . GERD 02/26/2007  . GERD (gastroesophageal reflux disease)   . Hiatal hernia   . HYPERGLYCEMIA 02/26/2007  . Hyperlipidemia   . Hypertension   . LBP (low back pain)   . Osteopenia   . Renal insufficiency 2007   creat 1.3  . UNSPECIFIED PERIPHERAL VASCULAR DISEASE 06/08/2010    Past Surgical History:  Procedure Laterality Date  . APPENDECTOMY  1962  . CHOLECYSTECTOMY    . [place]  . ERCP N/A  06/22/2015   Procedure: ENDOSCOPIC RETROGRADE CHOLANGIOPANCREATOGRAPHY (ERCP);  Surgeon: Ladene Artist, MD;  Location: [facility];  Service: Endoscopy;  Laterality: N/A;  . ESOPHAGOGASTRODUODENOSCOPY     with dilatation Dr Fuller Plan  . EUS N/A 07/27/2015   Procedure: UPPER ENDOSCOPIC ULTRASOUND (EUS) RADIAL;  Surgeon: [name], MD;  Location: [facility];  Service: Endoscopy;  Laterality: N/A;  . ingrown toenails      x3  . OOPHORECTOMY  1962  . PARATHYROIDECTOMY    . tail bone surgery    . TONSILLECTOMY AND ADENOIDECTOMY    . WRIST FRACTURE SURGERY  10/01     reports that she has never smoked. She has never used smokeless tobacco. She reports that she does not drink alcohol or use drugs.  Lives independently.  HOSPICE.  Allergies  Allergen Reactions  . Statins Other (See Comments)    Unknown reaction  . Diltiazem Hcl Other (See Comments)    REACTION: feeling drunk    Family History  Problem Relation Age of Onset  . Kidney disease Father     failure  . Colon cancer Sister      Prior to Admission medications   Medication Sig Start Date End Date Taking? Authorizing Provider  calcium carbonate (TUMS - DOSED IN MG ELEMENTAL CALCIUM) 500 MG chewable tablet Chew 1-2 tablets by  mouth daily as needed for indigestion or heartburn.    Yes Historical Provider, MD  citalopram (CELEXA) 20 MG tablet TAKE 1 TABLET BY MOUTH DAILY. Patient taking differently: TAKE 20mg  TABLET BY MOUTH once at night 06/02/15  Yes Doe-Hyun R Shawna Orleans, DO  furosemide (LASIX) 40 MG tablet Take 40 mg by mouth daily.   Yes Historical Provider, MD  gabapentin (NEURONTIN) 100 MG capsule Take 100 mg by mouth 3 (three) times daily.   Yes Historical Provider, MD  labetalol (NORMODYNE) 100 MG tablet TAKE 1 TABLET BY MOUTH DAILY. Patient taking differently: TAKE 100mg  TABLET BY MOUTH once at night 12/07/15  Yes Doe-Hyun R Shawna Orleans, DO  lipase/protease/amylase (CREON) 12000 UNITS CPEP capsule Take 1 capsule (12,000 Units  total) by mouth 3 (three) times daily before meals. 07/04/15  Yes Brunetta Genera, MD  LORazepam (ATIVAN) 0.5 MG tablet Take 1 tablet (0.5 mg total) by mouth daily as needed. for anxiety Patient taking differently: Take 0.5 mg by mouth at bedtime.  08/29/15  Yes Gerlene Fee, NP  megestrol (MEGACE) 40 MG/ML suspension Take 40 mg by mouth daily. 07/16/16  Yes Historical Provider, MD  omeprazole (PRILOSEC) 40 MG capsule TAKE 1 CAPSULE BY MOUTH DAILY. Patient taking differently: TAKE 40mg  CAPSULE BY MOUTH once at night 06/02/15  Yes Doe-Hyun R Shawna Orleans, DO  oxybutynin (DITROPAN XL) 15 MG 24 hr tablet Take 15 mg by mouth at bedtime.   Yes Historical Provider, MD  QC ANTI-DIARRHEAL 2 MG tablet Take 2 mg by mouth 4 (four) times daily as needed for diarrhea or loose stools. 06/19/16  Yes Historical Provider, MD  QC ARTHRITIS PAIN RELIEF 650 MG CR tablet Take 1,300 mg by mouth daily as needed for pain. 06/26/16  Yes Historical Provider, MD  traMADol (ULTRAM) 50 MG tablet Take 50 mg by mouth at bedtime.    Yes Historical Provider, MD  trolamine salicylate (ASPERCREME) 10 % cream Apply 1 application topically 2 (two) times daily as needed for muscle pain.   Yes Historical Provider, MD    Physical Exam: Vitals:   08/16/16 2330 08/17/16 0000 08/17/16 0221 08/17/16 0346  BP: (!) 108/40 (!) 121/38 (!) 102/47 (!) 112/44  Pulse: (!) 59 63 63 82  Resp: 19 17 14 16   Temp:   98.6 F (37 C) 98 F (36.7 C)  TempSrc:   Oral Oral  SpO2: 94% 96% 96% 100%  Height:    5\' 1"  (1.549 m)      Constitutional: NAD, calm, comfortable, NONtoxic appearing Vitals:   08/16/16 2330 08/17/16 0000 08/17/16 0221 08/17/16 0346  BP: (!) 108/40 (!) 121/38 (!) 102/47 (!) 112/44  Pulse: (!) 59 63 63 82  Resp: 19 17 14 16   Temp:   98.6 F (37 C) 98 F (36.7 C)  TempSrc:   Oral Oral  SpO2: 94% 96% 96% 100%  Height:    5\' 1"  (1.549 m)   Eyes: PERRL, lids and conjunctivae normal ENMT: Mucous membranes are moist. Posterior  pharynx clear of any exudate or lesions. Normal dentition.  Neck: normal appearance, supple Respiratory: clear to auscultation bilaterally, no wheezing, no crackles. Normal respiratory effort. No accessory muscle use.  Cardiovascular: Normal rate, regular rhythm, no murmurs / rubs / gallops. 2+ pitting edema in bilateral lower extremities.  2+ pedal pulses. GI: abdomen is soft and compressible.  No distention.  No tenderness.  Bowel sounds are present. Musculoskeletal:  No joint deformity in upper and lower extremities. Good ROM, no contractures. Normal muscle  tone.  Skin: no rashes, warm and dry Neurologic: No focal deficits. Psychiatric: Normal judgment and insight. Alert and oriented x 3. Normal mood.     Labs on Admission: I have personally reviewed following labs and imaging studies  CBC:  Recent Labs Lab 08/17/16 0023  WBC 5.4  NEUTROABS 2.7  HGB 9.3*  HCT 28.2*  MCV 86.5  PLT [id number]*   Basic Metabolic Panel:  Recent Labs Lab 08/17/16 0023  NA 144  K 3.8  CL 110  CO2 24  GLUCOSE 104*  BUN 50*  CREATININE 3.75*  CALCIUM 8.5*   GFR: CrCl cannot be calculated (Unknown ideal weight.).  Urine analysis:    Component Value Date/Time   COLORURINE AMBER (A) 08/17/2016 0233   APPEARANCEUR CLEAR 08/17/2016 0233   LABSPEC 1.017 08/17/2016 0233   PHURINE 5.0 08/17/2016 0233   GLUCOSEU NEGATIVE 08/17/2016 0233   HGBUR NEGATIVE 08/17/2016 0233   HGBUR negative 05/29/2009 1048   BILIRUBINUR NEGATIVE 08/17/2016 0233   BILIRUBINUR moderate (A) 06/19/2015 1553   BILIRUBINUR n 04/20/2012 1301   KETONESUR NEGATIVE 08/17/2016 0233   PROTEINUR NEGATIVE 08/17/2016 0233   UROBILINOGEN 1.0 06/19/2015 1553   UROBILINOGEN 0.2 11/09/2009 1815   NITRITE NEGATIVE 08/17/2016 0233   LEUKOCYTESUR NEGATIVE 08/17/2016 0233    Radiological Exams on Admission: Dg Hip Unilat W Or Wo Pelvis 2-3 Views Left  Result Date: 08/17/2016 CLINICAL DATA:  Left hip pain after a fall. History of  lower extremity weakness for 3 months. Multiple previous falls. History of pancreatic cancer. EXAM: DG HIP (WITH OR WITHOUT PELVIS) 2-3V LEFT COMPARISON:  None. FINDINGS: No evidence of acute fracture or dislocation of the left hip. Pelvis and visualized sacrum appear intact. No focal bone lesion or bone destruction. SI joints and symphysis pubis are not displaced. Degenerative changes in the lower lumbar spine and in both hips. There is suggestion of infiltration in the subcutaneous fat lateral to the left hip probably representing hematoma. IMPRESSION: No acute bony abnormalities. Degenerative changes. Probable hematoma in the soft tissues lateral to the left hip. Electronically Signed   By: Lucienne Capers M.D.   On: 08/17/2016 00:18    EKG: Independently reviewed. NSR.  No acute ST segment changes.  Assessment/Plan Principal Problem:   AKI (acute kidney injury) ([place]) Active Problems:   Essential hypertension   CKD (chronic kidney disease) stage 3, GFR 30-59 ml/min   Anemia   Pancreatic cancer (HCC)   Diabetes (HCC)   Chronic diarrhea      AKI on CKD 3 --Hydrate with NS --Hold lasix --Urine does not appear infected --Repeat BMP in AM  Left hip hematoma after fall --Avoid blood thinners --H/H q6h --Type and screen --monitor for the need for blood transfusion.  Chronic diarrhea --Supportive care.  Low index of suspicion for C diff at this point based on description, no fever, no WBC count  Fall --PT eval and treat  HTN --Labetalol   DVT prophylaxis: SCDs Code Status: DNR Family Communication: Patient alone at time of admission. Disposition Plan: Expect she will go back home with hospice support when ready for discharge. Consults called: NONE Admission status: Place in observation.   TIME SPENT: 50 minutes   Eber Jones MD [facility] Pager [phone]  If 7PM-7AM, please contact night-coverage [email or web address] Password [email or web address]  08/17/2016, 4:09  AM

## 2016-08-18 DIAGNOSIS — E785 Hyperlipidemia, unspecified: Secondary | ICD-10-CM | POA: Diagnosis present

## 2016-08-18 DIAGNOSIS — I129 Hypertensive chronic kidney disease with stage 1 through stage 4 chronic kidney disease, or unspecified chronic kidney disease: Secondary | ICD-10-CM | POA: Diagnosis present

## 2016-08-18 DIAGNOSIS — Z9049 Acquired absence of other specified parts of digestive tract: Secondary | ICD-10-CM | POA: Diagnosis not present

## 2016-08-18 DIAGNOSIS — N183 Chronic kidney disease, stage 3 (moderate): Secondary | ICD-10-CM | POA: Diagnosis present

## 2016-08-18 DIAGNOSIS — N179 Acute kidney failure, unspecified: Secondary | ICD-10-CM | POA: Diagnosis present

## 2016-08-18 DIAGNOSIS — K529 Noninfective gastroenteritis and colitis, unspecified: Secondary | ICD-10-CM | POA: Diagnosis present

## 2016-08-18 DIAGNOSIS — R531 Weakness: Secondary | ICD-10-CM | POA: Diagnosis present

## 2016-08-18 DIAGNOSIS — W19XXXA Unspecified fall, initial encounter: Secondary | ICD-10-CM | POA: Diagnosis present

## 2016-08-18 DIAGNOSIS — Z800 Family history of malignant neoplasm of digestive organs: Secondary | ICD-10-CM | POA: Diagnosis not present

## 2016-08-18 DIAGNOSIS — Y92009 Unspecified place in unspecified non-institutional (private) residence as the place of occurrence of the external cause: Secondary | ICD-10-CM | POA: Diagnosis not present

## 2016-08-18 DIAGNOSIS — S7002XA Contusion of left hip, initial encounter: Secondary | ICD-10-CM | POA: Diagnosis present

## 2016-08-18 DIAGNOSIS — K219 Gastro-esophageal reflux disease without esophagitis: Secondary | ICD-10-CM | POA: Diagnosis present

## 2016-08-18 DIAGNOSIS — Z66 Do not resuscitate: Secondary | ICD-10-CM | POA: Diagnosis present

## 2016-08-18 DIAGNOSIS — I959 Hypotension, unspecified: Secondary | ICD-10-CM | POA: Diagnosis present

## 2016-08-18 DIAGNOSIS — Z79899 Other long term (current) drug therapy: Secondary | ICD-10-CM | POA: Diagnosis not present

## 2016-08-18 DIAGNOSIS — F329 Major depressive disorder, single episode, unspecified: Secondary | ICD-10-CM | POA: Diagnosis present

## 2016-08-18 DIAGNOSIS — C259 Malignant neoplasm of pancreas, unspecified: Secondary | ICD-10-CM | POA: Diagnosis present

## 2016-08-18 DIAGNOSIS — E860 Dehydration: Secondary | ICD-10-CM | POA: Diagnosis present

## 2016-08-18 DIAGNOSIS — Z9181 History of falling: Secondary | ICD-10-CM | POA: Diagnosis not present

## 2016-08-18 DIAGNOSIS — E1151 Type 2 diabetes mellitus with diabetic peripheral angiopathy without gangrene: Secondary | ICD-10-CM | POA: Diagnosis present

## 2016-08-18 DIAGNOSIS — E1122 Type 2 diabetes mellitus with diabetic chronic kidney disease: Secondary | ICD-10-CM | POA: Diagnosis present

## 2016-08-18 LAB — GLUCOSE, CAPILLARY
Glucose-Capillary: 119 mg/dL — ABNORMAL HIGH (ref 65–99)
Glucose-Capillary: 115 mg/dL — ABNORMAL HIGH (ref 65–99)
Glucose-Capillary: 128 mg/dL — ABNORMAL HIGH (ref 65–99)
Glucose-Capillary: 113 mg/dL — ABNORMAL HIGH (ref 65–99)

## 2016-08-18 LAB — CBC
HEMATOCRIT: 29.1 % — AB (ref 36.0–46.0)
Hemoglobin: 9.5 g/dL — ABNORMAL LOW (ref 12.0–15.0)
MCH: 29.1 pg (ref 26.0–34.0)
MCHC: 32.6 g/dL (ref 30.0–36.0)
MCV: 89 fL (ref 78.0–100.0)
Platelets: 136 10*3/uL — ABNORMAL LOW (ref 150–400)
RBC: 3.27 MIL/uL — AB (ref 3.87–5.11)
RDW: 16.2 % — AB (ref 11.5–15.5)
WBC: 5.2 10*3/uL (ref 4.0–10.5)

## 2016-08-18 LAB — BASIC METABOLIC PANEL
ANION GAP: 9 (ref 5–15)
BUN: 48 mg/dL — ABNORMAL HIGH (ref 6–20)
CALCIUM: 8.1 mg/dL — AB (ref 8.9–10.3)
CO2: 21 mmol/L — AB (ref 22–32)
Chloride: 114 mmol/L — ABNORMAL HIGH (ref 101–111)
Creatinine, Ser: 3.32 mg/dL — ABNORMAL HIGH (ref 0.44–1.00)
GFR, EST AFRICAN AMERICAN: 14 mL/min — AB (ref >60)
GFR, EST NON AFRICAN AMERICAN: 12 mL/min — AB (ref >60)
GLUCOSE: 128 mg/dL — AB (ref 65–99)
POTASSIUM: 4.1 mmol/L (ref 3.5–5.1)
Sodium: 144 mmol/L (ref 135–145)

## 2016-08-18 NOTE — Progress Notes (Signed)
PROGRESS NOTE  Linda Bentley  [id number] DOB: 14-May-1929 DOA: 08/16/2016 PCP: Drema Pry, DO   Brief Narrative: Linda Bentley is a 83 y.o. female with pancreatic CA on home hospice and stage III CKD, HTN, HLD, chronic diarrhea, and PVD who was admitted for left hip hematoma due to unwitnessed fall and AKI. Creatinine 3.75 on admission, from baseline ~1.3. Left hip CT without fracture, but showing probable hematoma. She's been given IV fluids with slow improvement in creatinine (3.75 > 3.6 > 3.32). BP has remained low and hemoglobin has remained stable (9.3 > 8.8 > 9.3). Chronic diarrhea has continued, though per pt this has improved since admission. CDiff, culture, and GI pathogen panel are pending at this time. Due to concern for falls with increasing frequency, PT evaluation was ordered and recommendation is for nursing home placement. The patient and family agree with this being the safest option.   Assessment & Plan: Principal Problem:   AKI (acute kidney injury) ([place]) Active Problems:   Essential hypertension   CKD (chronic kidney disease) stage 3, GFR 30-59 ml/min   Anemia   Pancreatic cancer (HCC)   Diabetes (HCC)   Chronic diarrhea   Hematoma of left hip  AKI on CKD III: Improving with IVF's. - Decrease IV fluids in AM if taking better po. - Still holding lasix  Left hip hematoma after fall: Hemoglobin and ecchymosis stable, no indication for transfusion.  - PT eval recommended SNF. SW consulted.  Chronic diarrhea: Reportedly improved per pt. Low suspicion for CDiff with no abd pain, leukocytosis or fever. Chronicity argues against viral etiology.  - Continue supportive care - Stool culture, CDiff and GI pathogen panel pending.  - DC enteric precautions if stool studies negative.   HTN: Chronic, stable. Currently hypotensive. - Will hold labetalol. HR in 60s.  GERD: Chronic, stable - Continue PPI  Depression: Chronic, stable - Continue celexa  DVT  prophylaxis: SCDs Code Status: DNR Family Communication: None at bedside this AM Disposition Plan: Anticipate discharge to facility with 24-hr supervision for safety. Would prefer to continue hospice/palliative services.   Consultants:   PT and social work  Procedures:   None  Antimicrobials:  None  Subjective: Pt had single episode of emesis last night without preceding symptoms. Denies abdominal pain - diarrhea improved since admission from chronic baseline. Hip pain improved. No bleeding or bruising. Getting up with PT today. Has not eaten breakfast.  Objective: Vitals:   08/17/16 0346 08/17/16 1425 08/17/16 2056 08/18/16 0504  BP: (!) 112/44 (!) 118/47 98/71 (!) 103/48  Pulse: 82 66 63 69  Resp: 16 16 14 18   Temp: 98 F (36.7 C) 97.8 F (36.6 C) 98.4 F (36.9 C) 98.4 F (36.9 C)  TempSrc: Oral Oral Oral Oral  SpO2: 100% 100% 99% 98%  Height: 5\' 1"  (1.549 m)       Intake/Output Summary (Last 24 hours) at 08/18/16 1046 Last data filed at 08/17/16 2100  Gross per 24 hour  Intake           1047.5 ml  Output                0 ml  Net           1047.5 ml   Examination: General exam: Pleasant, elderly female in no distress Respiratory system: Non-labored breathing room air. Clear to auscultation bilaterally.  Cardiovascular system: Regular rate and rhythm. No murmur, rub, or gallop. No JVD, and 2+ pedal edema bilaterally. Gastrointestinal  system: Abdomen soft, non-tender, non-distended, with normoactive bowel sounds. No organomegaly or masses felt. Central nervous system: Alert and oriented. No focal neurological deficits. Extremities: Left hip with ~3cm diameter round ecchymosis overlying greater trochanter not tender to palpation. Hip with full ROM. Compartment soft. Cap refill brisk throughout. Skin: No rashes, lesions no ulcers Psychiatry: Judgement and insight appear normal. Mood & affect appropriate.   Data Reviewed: I have personally reviewed following labs and  imaging studies  CBC:  Recent Labs Lab 08/17/16 0023 08/17/16 0414 08/17/16 1022 08/17/16 1531 08/17/16 2151 08/18/16 0513  WBC 5.4 4.9  --   --   --  5.2  NEUTROABS 2.7  --   --   --   --   --   HGB 9.3* 8.8* 9.3* 9.3* 8.8* 9.5*  HCT 28.2* 26.7* 28.6* 28.5* 26.7* 29.1*  MCV 86.5 87.8  --   --   --  89.0  PLT 135* 136*  --   --   --  [id number]*   Basic Metabolic Panel:  Recent Labs Lab 08/17/16 0023 08/17/16 0414 08/18/16 0513  NA 144 143 144  K 3.8 3.9 4.1  CL 110 110 114*  CO2 24 24 21*  GLUCOSE 104* 99 128*  BUN 50* 50* 48*  CREATININE 3.75* 3.60* 3.32*  CALCIUM 8.5* 8.1* 8.1*   GFR: CrCl cannot be calculated (Unknown ideal weight.).   CBG:  Recent Labs Lab 08/17/16 0756 08/17/16 1237 08/17/16 1648 08/17/16 2013 08/18/16 0807  GLUCAP 94 134* 117* 139* 119*   Urine analysis:    Component Value Date/Time   COLORURINE AMBER (A) 08/17/2016 0233   APPEARANCEUR CLEAR 08/17/2016 0233   LABSPEC 1.017 08/17/2016 0233   PHURINE 5.0 08/17/2016 0233   GLUCOSEU NEGATIVE 08/17/2016 0233   HGBUR NEGATIVE 08/17/2016 0233   HGBUR negative 05/29/2009 1048   BILIRUBINUR NEGATIVE 08/17/2016 0233   BILIRUBINUR moderate (A) 06/19/2015 1553   BILIRUBINUR n 04/20/2012 1301   KETONESUR NEGATIVE 08/17/2016 0233   PROTEINUR NEGATIVE 08/17/2016 0233   UROBILINOGEN 1.0 06/19/2015 1553   UROBILINOGEN 0.2 11/09/2009 1815   NITRITE NEGATIVE 08/17/2016 0233   LEUKOCYTESUR NEGATIVE 08/17/2016 0233   Radiology Studies: Dg Hip Unilat W Or Wo Pelvis 2-3 Views Left  Result Date: 08/17/2016 CLINICAL DATA:  Left hip pain after a fall. History of lower extremity weakness for 3 months. Multiple previous falls. History of pancreatic cancer. EXAM: DG HIP (WITH OR WITHOUT PELVIS) 2-3V LEFT COMPARISON:  None. FINDINGS: No evidence of acute fracture or dislocation of the left hip. Pelvis and visualized sacrum appear intact. No focal bone lesion or bone destruction. SI joints and symphysis pubis  are not displaced. Degenerative changes in the lower lumbar spine and in both hips. There is suggestion of infiltration in the subcutaneous fat lateral to the left hip probably representing hematoma. IMPRESSION: No acute bony abnormalities. Degenerative changes. Probable hematoma in the soft tissues lateral to the left hip. Electronically Signed   By: Lucienne Capers M.D.   On: 08/17/2016 00:18   Time spent: 25 minutes.  Vance Gather, MD [facility] Pager [phone]  If 7PM-7AM, please contact night-coverage on [email or web address] (password [email or web address]) 08/18/2016, 10:46 AM

## 2016-08-18 NOTE — Progress Notes (Addendum)
[facility] GIP - W/E SW Liaison Visit  As previously documented, this is a related and covered GIP admission to [facility] diagnosis Pancreatic Cancer per Dr. Konrad Dolores. Code status is DNR. She was admitted to [facility] after sustaining a fall at home.    Spoke with CSW and RNCM re disposition for this patient and questioned that patient class is currently observation.   Visited with patient in room. She has just bee assisted by PT to chair. She has breakfast tray of oatmeal and scrambled eggs that has not been touched. She tells me she is too tired to eat and that she is tired of grits and oatmeal and eggs. RN Jenny Reichmann joined this visit and offered to order her other food. She requested peaches and toast. Appreciate Cindy's care of and attention to this patient. Patient tells me she is sleepy all the time and does not understand why. She tells me she may not be able to return home because she is no longer able to care for herself. She tells me she is supposed to work with PT today to see if she can get stronger and return home. She is agreeable to whatever is best for her and can be worked out. Patient expressed appreciation for her home care team and this visit. Appreciate report from [place]. Patient is aware an [facility] RN will visit her tomorrow.    [facility] will continue to follow for support and assist with planning. Please do not hesitate to call with questions.    Thank you,  Erling Conte, LCSW [phone]

## 2016-08-18 NOTE — Evaluation (Signed)
Physical Therapy Evaluation Patient Details Name: Linda Bentley MRN: [id number] DOB: 1928-10-20 Today's Date: 08/18/2016   History of Present Illness  Linda Bentley is a 83 y.o. female with pancreatic CA on home hospice and stage III CKD, HTN, HLD, and PVD who was admitted for left hip hematoma due to unwitnessed fall and AKI.  Clinical Impression  Pt admitted with above diagnosis. Pt currently with functional limitations due to the deficits listed below (see PT Problem List). Pt will benefit from skilled PT to increase their independence and safety with mobility to allow discharge to the venue listed below.  Pt is not safe to d/c home alone and pt is verbalizing her understanding of this.  Pt was seen for a fall less than 2 weeks ago in ER and returned home.  Will follow acutely and recommend SNF and 24 hour S.    Follow Up Recommendations SNF;Supervision/Assistance - 24 hour    Equipment Recommendations  None recommended by PT    Recommendations for Other Services       Precautions / Restrictions Precautions Precautions: Fall Restrictions Weight Bearing Restrictions: No      Mobility  Bed Mobility Overal bed mobility: Needs Assistance Bed Mobility: Supine to Sit     Supine to sit: HOB elevated;Min guard     General bed mobility comments: Pt able to come to EOB with min/guard. Dizziness upon sitting that subsided.  Transfers Overall transfer level: Needs assistance Equipment used: Rolling walker (2 wheeled) Transfers: Sit to/from Stand Sit to Stand: Min assist         General transfer comment: MIN A to power up and cueing for hand placement on RW as pt placed them on front bar rather than hand grips.  Ambulation/Gait             General Gait Details: Pt declined gait citing wanting to eat her breakfast and feeling too weak to ambulate  Stairs            Wheelchair Mobility    Modified Rankin (Stroke Patients Only)       Balance Overall  balance assessment: History of Falls;Needs assistance           Standing balance-Leahy Scale: Poor Standing balance comment: requires RW                             Pertinent Vitals/Pain Pain Assessment: No/denies pain    Home Living Family/patient expects to be discharged to:: Skilled nursing facility Living Arrangements: Alone Available Help at Discharge: Family;Available PRN/intermittently Type of Home: Apartment Home Access: Level entry     Home Layout: One level Home Equipment: Other (comment);Walker - 4 wheels;Cane - single point;Tub bench;Hand held shower head (3 wheeled walker)      Prior Function Level of Independence: Independent with assistive device(s);Needs assistance      ADL's / Homemaking Assistance Needed: aides 2x/week for bathing assistance        Hand Dominance        Extremity/Trunk Assessment   Upper Extremity Assessment Upper Extremity Assessment: Generalized weakness    Lower Extremity Assessment Lower Extremity Assessment: Generalized weakness    Cervical / Trunk Assessment Cervical / Trunk Assessment: Kyphotic  Communication   Communication: HOH  Cognition Arousal/Alertness: Awake/alert Behavior During Therapy: WFL for tasks assessed/performed Overall Cognitive Status: Within Functional Limits for tasks assessed  General Comments: Pt was repetitive and seemed initially slightly confused, but has awaken her from sleep.    General Comments General comments (skin integrity, edema, etc.): swelling in B LE which pt reports she has had her whole life    Exercises     Assessment/Plan    PT Assessment Patient needs continued PT services  PT Problem List Decreased strength;Decreased activity tolerance;Decreased balance;Decreased mobility          PT Treatment Interventions DME instruction;Gait training;Therapeutic exercise;Balance training;Functional mobility training;Therapeutic  activities;Patient/family education    PT Goals (Current goals can be found in the Care Plan section)  Acute Rehab PT Goals Patient Stated Goal: Pt agreeable to rehab.  Verbalized she knew she had many falls and wasn't safe to go home PT Goal Formulation: With patient Time For Goal Achievement: 09/01/16 Potential to Achieve Goals: Good    Frequency Min 3X/week   Barriers to discharge Decreased caregiver support      Co-evaluation               End of Session Equipment Utilized During Treatment: Gait belt Activity Tolerance: Patient tolerated treatment well Patient left: in chair;with call bell/phone within reach;with chair alarm set Nurse Communication: Mobility status    Functional Assessment Tool Used: clinical judgement Functional Limitation: Mobility: Walking and moving around Mobility: Walking and Moving Around Current Status [phone]): At least 1 percent but less than 20 percent impaired, limited or restricted Mobility: Walking and Moving Around Goal Status [phone]): At least 1 percent but less than 20 percent impaired, limited or restricted    Time: 0851 (MD in room for a portion of session)-0925 PT Time Calculation (min) (ACUTE ONLY): 34 min   Charges:   PT Evaluation $PT Eval Low Complexity: 1 Procedure     PT G Codes:   PT G-Codes **NOT FOR INPATIENT CLASS** Functional Assessment Tool Used: clinical judgement Functional Limitation: Mobility: Walking and moving around Mobility: Walking and Moving Around Current Status [id number]): At least 1 percent but less than 20 percent impaired, limited or restricted Mobility: Walking and Moving Around Goal Status [phone]): At least 1 percent but less than 20 percent impaired, limited or restricted    [facility] LUBECK 08/18/2016, 10:11 AM

## 2016-08-19 ENCOUNTER — Inpatient Hospital Stay (HOSPITAL_COMMUNITY)

## 2016-08-19 LAB — GLUCOSE, CAPILLARY
GLUCOSE-CAPILLARY: 92 mg/dL (ref 65–99)
GLUCOSE-CAPILLARY: 123 mg/dL — AB (ref 65–99)
Glucose-Capillary: 153 mg/dL — ABNORMAL HIGH (ref 65–99)
Glucose-Capillary: 106 mg/dL — ABNORMAL HIGH (ref 65–99)

## 2016-08-19 LAB — BASIC METABOLIC PANEL
Anion gap: 10 (ref 5–15)
BUN: 49 mg/dL — ABNORMAL HIGH (ref 6–20)
CALCIUM: 7.8 mg/dL — AB (ref 8.9–10.3)
CO2: 21 mmol/L — ABNORMAL LOW (ref 22–32)
CREATININE: 3.14 mg/dL — AB (ref 0.44–1.00)
Chloride: 113 mmol/L — ABNORMAL HIGH (ref 101–111)
GFR calc non Af Amer: 13 mL/min — ABNORMAL LOW (ref >60)
GFR, EST AFRICAN AMERICAN: 15 mL/min — AB (ref >60)
Glucose, Bld: 100 mg/dL — ABNORMAL HIGH (ref 65–99)
Potassium: 4.4 mmol/L (ref 3.5–5.1)
SODIUM: 144 mmol/L (ref 135–145)

## 2016-08-19 NOTE — NC FL2 (Signed)
[place] LEVEL OF CARE SCREENING TOOL     IDENTIFICATION  Patient Name: Linda Bentley Birthdate: 03-Oct-1928 Sex: female Admission Date (Current Location): 08/16/2016  [facility] and [place] Number:  Herbalist and Address:  [facility],  [place] [address] [place]      Provider Number: [id number]  Attending Physician Name and Address:  Patrecia Pour, MD  Relative Name and Phone Number:       Current Level of Care: Hospital Recommended Level of Care: [place] Prior Approval Number:    Date Approved/Denied:   PASRR Number: [id number]  Discharge Plan: SNF    Current Diagnoses: Patient Active Problem List   Diagnosis Date Noted  . Acute renal failure (ARF) ([place]) 08/18/2016  . ARF (acute renal failure) ([place]) 08/17/2016  . AKI (acute kidney injury) ([place]) 08/17/2016  . Chronic diarrhea 08/17/2016  . Hematoma of left hip 08/17/2016  . Diabetes ([place]) 08/22/2015  . Metabolic acidosis   . Hyperosmolarity due to secondary diabetes ([place]) 07/29/2015  . Clio (hyperglycemic hyperosmolar nonketotic coma) ([place]) 07/29/2015  . Secondary DM with DKA, uncontrolled ([place]) 07/28/2015  . Pancreatic cancer ([place]) 07/18/2015  . Dehydration 07/18/2015  . Common biliary duct obstruction 06/22/2015  . Abnormal CT scan, pancreas or bile duct 06/22/2015  . Obstructive jaundice 06/22/2015  . Elevated LFTs 06/22/2015  . Dysphagia 10/10/2014  . Headache 10/10/2014  . Counseling regarding end of life decision making 10/10/2014  . Preventative health care 07/11/2014  . Dizziness and giddiness 07/11/2014  . Anemia 01/25/2014  . Urinary incontinence 04/08/2012  . Back pain 04/01/2012  . Restless legs syndrome (RLS) 07/08/2011  . UNSPECIFIED PERIPHERAL VASCULAR DISEASE 06/08/2010  . OSTEOARTHRITIS, KNEE, RIGHT 12/28/2008  . OBESITY 04/14/2008  . Hyperlipidemia 02/26/2007  . Depression 02/26/2007  . Essential hypertension 02/26/2007  .  GERD 02/26/2007  . CKD (chronic kidney disease) stage 3, GFR 30-59 ml/min 02/26/2007  . OSTEOPENIA 02/26/2007  . Prediabetes 02/26/2007    Orientation RESPIRATION BLADDER Height & Weight     Self, Place    Continent Weight:   Height:  5\' 1"  (154.9 cm)  BEHAVIORAL SYMPTOMS/MOOD NEUROLOGICAL BOWEL NUTRITION STATUS  Other (Comment) (no behaviors)   Continent    AMBULATORY STATUS COMMUNICATION OF NEEDS Skin   Extensive Assist Verbally Normal                       Personal Care Assistance Level of Assistance  Bathing, Feeding, Dressing Bathing Assistance: Limited assistance Feeding assistance: Independent Dressing Assistance: Limited assistance     Functional Limitations Info  Sight, Hearing, Speech Sight Info: Adequate Hearing Info: Adequate Speech Info: Adequate    SPECIAL CARE FACTORS FREQUENCY  PT (By licensed PT), OT (By licensed OT)                    Contractures Contractures Info: Not present    Additional Factors Info    Code Status Info: 5x wk Allergies Info: 5x wk           Current Medications (08/19/2016):  This is the current hospital active medication list Current Facility-Administered Medications  Medication Dose Route Frequency Provider Last Rate Last Dose  . 0.9 %  sodium chloride infusion   Intravenous Continuous Lily Kocher, MD 75 mL/hr at 08/18/16 1250    . acetaminophen (TYLENOL) tablet 650 mg  650 mg Oral Q6H PRN Lily Kocher, MD  Or  . acetaminophen (TYLENOL) suppository 650 mg  650 mg Rectal Q6H PRN Lily Kocher, MD      . citalopram (CELEXA) tablet 20 mg  20 mg Oral Daily Lily Kocher, MD   20 mg at 08/19/16 1020  . gabapentin (NEURONTIN) capsule 100 mg  100 mg Oral TID Lily Kocher, MD   100 mg at 08/19/16 1019  . insulin aspart (novoLOG) injection 0-9 Units  0-9 Units Subcutaneous TID WC Lily Kocher, MD   2 Units at 08/19/16 1319  . lipase/protease/amylase (CREON) capsule 12,000 Units  12,000 Units Oral TID [facility] Lily Kocher, MD   12,000 Units at 08/19/16 1319  . LORazepam (ATIVAN) tablet 0.5 mg  0.5 mg Oral QHS Lily Kocher, MD   0.5 mg at 08/18/16 2200  . megestrol (MEGACE) 40 MG/ML suspension 40 mg  40 mg Oral Daily Lily Kocher, MD   40 mg at 08/19/16 1024  . ondansetron (ZOFRAN) tablet 4 mg  4 mg Oral Q6H PRN Lily Kocher, MD       Or  . ondansetron [facility]) injection 4 mg  4 mg Intravenous Q6H PRN Lily Kocher, MD      . oxybutynin (DITROPAN-XL) 24 hr tablet 15 mg  15 mg Oral QHS Lily Kocher, MD   15 mg at 08/18/16 2200  . pantoprazole (PROTONIX) EC tablet 40 mg  40 mg Oral Daily Lily Kocher, MD   40 mg at 08/19/16 1019  . traMADol (ULTRAM) tablet 50 mg  50 mg Oral QHS Lily Kocher, MD   50 mg at 08/18/16 2200     Discharge Medications: Please see discharge summary for a list of discharge medications.  Relevant Imaging Results:  Relevant Lab Results:   Additional Information SSN: [id number]  Breanah Faddis, Randall An, LCSW

## 2016-08-19 NOTE — Progress Notes (Signed)
CSW consulted to assist with d/c planning. PT has recommended SNF at d/c. CSW met with pt at bedside.  She is alert / oriented to person / place. Pt is in agreement with SNF and asked CSW to  contact her family Vincenza Hews [phone] ) for assistance with d/c planning.  Pt was recently at [facility] and had a good experience. SNF contacted and clinicals sent for review. SNF has made a bed offer. Family has been updated and in agreement with plan for [facility] at d/c. [facility] will revoke hospice prior to d/c. CSW will continue to follow to assist with d/c planning to SNF.  Werner Lean LCSW [phone]

## 2016-08-19 NOTE — Progress Notes (Signed)
[facility]5- [facility] - G[facility] RN Visit  This is a related, covered GIP admission, per MD [facility].  She was [facility] to [facility] on 08/22/15 with Pancreatic CA.  Patient is a DNR.  She was admitted to [facility] after sustaining a fall at home.  She reported that she had been experiencing increasing bilateral lower extremity weakness.  She also notes that she has been having intermittent diarrhea.  Admission date of 08/17/16 for Acute Kidney Injury.    Patient visited in room, sitting up in chair.  Patient alert, oriented and pleasant.  Patient reported no pain and stated she has not had anymore loose stools, since yesterday.  Patient is on RA with normal O2 sats and respirations.  Ecchymosis noted to Lt forehead and side of face.  She is currently receiving IVF at a rate of 75 ml/hr through a PIV.  Patient is awaiting her breakfast tray at this time. Patient verbalized concern of going back to her home, and is hopeful to be discharged to a facility to further work with PT.  Hospice will continue to follow daily.  Please call with any hospice-related questions or concerns.  Thank you, Freddi Starr RN, [facility] Liaison [phone]  [facility] hospice liaison are now on [place].

## 2016-08-19 NOTE — Progress Notes (Signed)
PROGRESS NOTE  CYANI SPACE  [id number] DOB: June 27, 1929 DOA: 08/16/2016 PCP: Drema Pry, DO   Brief Narrative: Linda Bentley is a 83 y.o. female with pancreatic CA on home hospice and stage III CKD, HTN, HLD, chronic diarrhea, and PVD who was admitted for left hip hematoma due to unwitnessed fall and AKI. Creatinine 3.75 on admission, from baseline ~1.3. Left hip CT without fracture, but showing probable hematoma. She's been given IV fluids with slow improvement in creatinine (3.75 > 3.6 > 3.32). BP has remained low and hemoglobin has remained stable (9.3 > 8.8 > 9.3). Chronic diarrhea has continued, though per pt this has improved since admission. CDiff, culture, and GI pathogen panel are pending at this time. Due to concern for falls with increasing frequency, PT evaluation was ordered and recommendation is for nursing home placement. The patient and family agree with this being the safest option.   Assessment & Plan: Principal Problem:   AKI (acute kidney injury) ([place]) Active Problems:   Essential hypertension   CKD (chronic kidney disease) stage 3, GFR 30-59 ml/min   Anemia   Pancreatic cancer (HCC)   Diabetes (HCC)   Chronic diarrhea   Hematoma of left hip   Acute renal failure (ARF) ([place])  AKI on CKD III: Limited improvement with IVF's. - DC IV fluids as she's taking po and LE edema has increased.  - Still holding lasix - Renal U/S - Check FENa  Left hip hematoma after fall: Hemoglobin and ecchymosis stable, no indication for transfusion.  - PT eval recommended SNF. SW consulted.  Chronic diarrhea: Resolved since admission. Low suspicion for CDiff with no abd pain, leukocytosis or fever. Chronicity argues against viral etiology.  - Continue supportive care - DC enteric precautions and stool studies as pt has not had BM in 48 hours.  HTN: Chronic, stable. Currently hypotensive. - Will hold labetalol. HR in 60s.  GERD: Chronic, stable - Continue  PPI  Depression: Chronic, stable - Continue celexa  DVT prophylaxis: SCDs Code Status: DNR Family Communication: None at bedside this AM Disposition Plan: Anticipate discharge to facility with 24-hr supervision for safety in next 24-48 hours.  Consultants:   PT and social work  Procedures:   None  Antimicrobials:  None  Subjective: Pt has not had BM since CDiff/GI pathogen panel ordered. No abdominal pain, nausea or emesis. Has been up to chair but pt and RN staff feel pt getting weaker rapidly. Eating ok.  Objective: Vitals:   08/18/16 0504 08/18/16 1341 08/18/16 2113 08/19/16 0413  BP: (!) 103/48 (!) 120/56 (!) 101/42 (!) 127/46  Pulse: 69 62 64 66  Resp: 18 16 18 16   Temp: 98.4 F (36.9 C) 98 F (36.7 C) 98.5 F (36.9 C) 98.6 F (37 C)  TempSrc: Oral Oral Oral Oral  SpO2: 98% 96% 97% 98%  Height:        Intake/Output Summary (Last 24 hours) at 08/19/16 [id number] Last data filed at 08/19/16 0600  Gross per 24 hour  Intake          2568.75 ml  Output                0 ml  Net          2568.75 ml   Examination: General exam: Pleasant, elderly female in no distress Respiratory system: Non-labored breathing room air. Clear to auscultation bilaterally.  Cardiovascular system: Regular rate and rhythm. No murmur, rub, or gallop. No JVD, and 2 to 3+  LE edema bilaterally. Gastrointestinal system: Abdomen soft, non-tender, non-distended, with normoactive bowel sounds. No organomegaly or masses felt. Central nervous system: Alert and oriented. No focal neurological deficits. Extremities: Left hip with ~2cm diameter round ecchymosis overlying greater trochanter not tender to palpation. Hip with full ROM. Compartment soft. Cap refill brisk throughout. Skin: No rashes, lesions no ulcers Psychiatry: Judgement and insight appear normal. Mood & affect appropriate.   Data Reviewed: I have personally reviewed following labs and imaging studies  CBC:  Recent Labs Lab  08/17/16 0023 08/17/16 0414 08/17/16 1022 08/17/16 1531 08/17/16 2151 08/18/16 0513  WBC 5.4 4.9  --   --   --  5.2  NEUTROABS 2.7  --   --   --   --   --   HGB 9.3* 8.8* 9.3* 9.3* 8.8* 9.5*  HCT 28.2* 26.7* 28.6* 28.5* 26.7* 29.1*  MCV 86.5 87.8  --   --   --  89.0  PLT 135* 136*  --   --   --  [id number]*   Basic Metabolic Panel:  Recent Labs Lab 08/17/16 0023 08/17/16 0414 08/18/16 0513 08/19/16 0413  NA 144 143 144 144  K 3.8 3.9 4.1 4.4  CL 110 110 114* 113*  CO2 24 24 21* 21*  GLUCOSE 104* 99 128* 100*  BUN 50* 50* 48* 49*  CREATININE 3.75* 3.60* 3.32* 3.14*  CALCIUM 8.5* 8.1* 8.1* 7.8*   GFR: CrCl cannot be calculated (Unknown ideal weight.).   CBG:  Recent Labs Lab 08/18/16 0807 08/18/16 1119 08/18/16 1648 08/18/16 2103 08/19/16 0812  GLUCAP 119* 115* 128* 113* 92   Urine analysis:    Component Value Date/Time   COLORURINE AMBER (A) 08/17/2016 0233   APPEARANCEUR CLEAR 08/17/2016 0233   LABSPEC 1.017 08/17/2016 0233   PHURINE 5.0 08/17/2016 0233   GLUCOSEU NEGATIVE 08/17/2016 0233   HGBUR NEGATIVE 08/17/2016 0233   HGBUR negative 05/29/2009 1048   BILIRUBINUR NEGATIVE 08/17/2016 0233   BILIRUBINUR moderate (A) 06/19/2015 1553   BILIRUBINUR n 04/20/2012 1301   KETONESUR NEGATIVE 08/17/2016 0233   PROTEINUR NEGATIVE 08/17/2016 0233   UROBILINOGEN 1.0 06/19/2015 1553   UROBILINOGEN 0.2 11/09/2009 1815   NITRITE NEGATIVE 08/17/2016 0233   LEUKOCYTESUR NEGATIVE 08/17/2016 0233   Radiology Studies: No results found. Time spent: 25 minutes.  Vance Gather, MD [facility] Pager [phone]  If 7PM-7AM, please contact night-coverage on [email or web address] (password [email or web address]) 08/19/2016, 9:05 AM

## 2016-08-19 NOTE — Clinical Social Work Placement (Signed)
   CLINICAL SOCIAL WORK PLACEMENT  NOTE  Date:  08/19/2016  Patient Details  Name: Linda Bentley MRN: [id number] Date of Birth: [birth year]  Clinical Social Work is seeking post-discharge placement for this patient at the [place] level of care (*CSW will initial, date and re-position this form in  chart as items are completed):  No   Patient/family provided with [place] Work Department's list of facilities offering this level of care within the geographic area requested by the patient (or if unable, by the patient's family).  Yes   Patient/family informed of their freedom to choose among providers that offer the needed level of care, that participate in Medicare, Medicaid or managed care program needed by the patient, have an available bed and are willing to accept the patient.  Yes   Patient/family informed of [place]'s ownership interest in [facility] and [facility], as well as of the fact that they are under no obligation to receive care at these facilities.  PASRR submitted to EDS on       PASRR number received on       Existing PASRR number confirmed on 08/19/16     FL2 transmitted to all facilities in geographic area requested by pt/family on 08/19/16     FL2 transmitted to all facilities within larger geographic area on       Patient informed that his/her managed care company has contracts with or will negotiate with certain facilities, including the following:        Yes   Patient/family informed of bed offers received.  Patient chooses bed at [place] recommends and patient chooses bed at      Patient to be transferred to [facility] on  .  Patient to be transferred to facility by       Patient family notified on   of transfer.  Name of family member notified:        PHYSICIAN       Additional Comment:     _______________________________________________ Luretha Rued, [place] 08/19/2016, 2:12 PM

## 2016-08-20 LAB — BASIC METABOLIC PANEL
ANION GAP: 7 (ref 5–15)
BUN: 50 mg/dL — AB (ref 6–20)
CHLORIDE: 114 mmol/L — AB (ref 101–111)
CO2: 21 mmol/L — ABNORMAL LOW (ref 22–32)
Calcium: 7.9 mg/dL — ABNORMAL LOW (ref 8.9–10.3)
Creatinine, Ser: 2.97 mg/dL — ABNORMAL HIGH (ref 0.44–1.00)
GFR calc Af Amer: 16 mL/min — ABNORMAL LOW (ref >60)
GFR, EST NON AFRICAN AMERICAN: 13 mL/min — AB (ref >60)
Glucose, Bld: 118 mg/dL — ABNORMAL HIGH (ref 65–99)
POTASSIUM: 4.2 mmol/L (ref 3.5–5.1)
SODIUM: 142 mmol/L (ref 135–145)

## 2016-08-20 LAB — GLUCOSE, CAPILLARY
GLUCOSE-CAPILLARY: 131 mg/dL — AB (ref 65–99)
Glucose-Capillary: 110 mg/dL — ABNORMAL HIGH (ref 65–99)

## 2016-08-20 MED ORDER — LORAZEPAM 0.5 MG PO TABS: 0.5000 mg | ORAL_TABLET | Freq: Every day | ORAL | 0 refills | Status: DC | PRN

## 2016-08-20 NOTE — Clinical Social Work Placement (Signed)
   CLINICAL SOCIAL WORK PLACEMENT  NOTE  Date:  08/20/2016  Patient Details  Name: CIDNEE NISHIYAMA MRN: [id number] Date of Birth: Dec 04, 1928  Clinical Social Work is seeking post-discharge placement for this patient at the [place] level of care (*CSW will initial, date and re-position this form in  chart as items are completed):  No   Patient/family provided with [place] Work Department's list of facilities offering this level of care within the geographic area requested by the patient (or if unable, by the patient's family).  Yes   Patient/family informed of their freedom to choose among providers that offer the needed level of care, that participate in Medicare, Medicaid or managed care program needed by the patient, have an available bed and are willing to accept the patient.  Yes   Patient/family informed of [place]'s ownership interest in [facility] and [facility], as well as of the fact that they are under no obligation to receive care at these facilities.  PASRR submitted to EDS on       PASRR number received on       Existing PASRR number confirmed on 08/19/16     FL2 transmitted to all facilities in geographic area requested by pt/family on 08/19/16     FL2 transmitted to all facilities within larger geographic area on       Patient informed that his/her managed care company has contracts with or will negotiate with certain facilities, including the following:        Yes   Patient/family informed of bed offers received.  Patient chooses bed at [place] recommends and patient chooses bed at      Patient to be transferred [facility] on 08/20/16.  Patient to be transferred to facility by [facility]     Patient family notified on 08/20/16 of transfer.  Name of family member notified:  Mr. Rich Reining     PHYSICIAN       Additional Comment: Pt / son are in agreement with d/c  to [facility] today. [facility] transport is required. Medical necessity form completed. Family is aware out of pocket costs may be associated with [facility] transport. Scripts included in d/c packet. # for report provided to nsg.   _______________________________________________ Luretha Rued, LCSW 08/20/2016, 12:46 PM

## 2016-08-20 NOTE — Discharge Summary (Signed)
Physician Discharge Summary  Linda Bentley [id number] DOB: 17-Oct-1928 DOA: 08/16/2016  PCP: Drema Pry, DO  Admit date: 08/16/2016 Discharge date: 08/20/2016  Admitted From: Home Disposition: SNF   Recommendations for Outpatient Follow-up:  1. Follow up with PCP in 1-2 weeks. 2. Please obtain BMP to monitor renal function. Lasix held during admission with improvement in creatinine. Will need monitoring of volume status. 3. Please obtain CBC to monitor chronic anemia. 4. Monitor HR and BP - labetalol held due to borderline hypotension. 5. Currently receiving Megace 40mg  daily (this dose is usually for endometrial or breast cancer). If this is being used for appetite stimulation, usual dose is 400mg -800mg  daily.   Home Health: N/A Equipment/Devices: N/A Discharge Condition: Stable CODE STATUS: DNR Diet recommendation:Heart healthy  Brief/Interim Summary: Linda Bentley is a 83 y.o. female with pancreatic CA on home hospice and stage III CKD, HTN, HLD, chronic diarrhea, and PVD who was admitted for left hip hematoma due to unwitnessed fall and AKI. Creatinine 3.75 on admission, from baseline ~1.3. Left hip CT without fracture, but showing probable hematoma. She's been given IV fluids and lasix has been held with slow improvement in creatinine (3.75 > 3.6 > 3.32 > 3.14 > 2.97). BP has remained low and hemoglobin has remained stable (9.3 > 8.8 > 9.5). Chronic diarrhea has continued, though per pt this has improved since admission. CDiff, culture, and GI pathogen panel were ordered but diarrhea resolved so these were not collected and enteric precautions, having been started empirically, were discontinued. Due to concern for falls with increasing frequency, PT evaluation was ordered and recommendation is for nursing home placement.   Discharge Diagnoses:  Principal Problem:   AKI (acute kidney injury) ([place]) Active Problems:   Essential hypertension   CKD (chronic kidney disease) stage 3,  GFR 30-59 ml/min   Anemia   Pancreatic cancer (HCC)   Diabetes (HCC)   Chronic diarrhea   Hematoma of left hip   Acute renal failure (ARF) ([place])  AKI on CKD III: Limited improvement with IVF's/holding lasix. Renal U/S shows cortical thinning without hydro consistent with chronic medical renal disease. - DC'ed IV fluids as she's taking po and still has 2+ LE edema, lungs clear.  - Still holding lasix - Check FENa: Not collected at this time.  Left hip hematoma after fall: Hemoglobin and ecchymosis stable, no indication for transfusion.  - PT eval recommended SNF. SW consulted.  Chronic diarrhea: Resolved since admission. Low suspicion for CDiff with no abd pain, leukocytosis or fever. Chronicity argues against viral etiology.  - Continue supportive care - DC'ed enteric precautions and stool studies as pt has not had BM in 48 hours.  HTN: Chronic, stable. - Held labetalol due to intermittent hypotension. HR in 60s.  GERD: Chronic, stable - Continue PPI  Depression: Chronic, stable - Continue celexa  Discharge Instructions  Allergies as of 08/20/2016      Reactions   Statins Other (See Comments)   Unknown reaction   Diltiazem Hcl Other (See Comments)   REACTION: feeling drunk      Medication List    STOP taking these medications   labetalol 100 MG tablet Commonly known as:  NORMODYNE   traMADol 50 MG tablet Commonly known as:  ULTRAM     TAKE these medications   calcium carbonate 500 MG chewable tablet Commonly known as:  TUMS - dosed in mg elemental calcium Chew 1-2 tablets by mouth daily as needed for indigestion or heartburn.   citalopram  20 MG tablet Commonly known as:  CELEXA TAKE 1 TABLET BY MOUTH DAILY. What changed:  See the new instructions.   furosemide 40 MG tablet Commonly known as:  LASIX Take 40 mg by mouth daily.   gabapentin 100 MG capsule Commonly known as:  NEURONTIN Take 100 mg by mouth 3 (three) times daily.    lipase/protease/amylase 12000 units Cpep capsule Commonly known as:  CREON Take 1 capsule (12,000 Units total) by mouth 3 (three) times daily before meals.   LORazepam 0.5 MG tablet Commonly known as:  ATIVAN Take 1 tablet (0.5 mg total) by mouth daily as needed. for anxiety What changed:  when to take this  reasons to take this  additional instructions   megestrol 40 MG/ML suspension Commonly known as:  MEGACE Take 40 mg by mouth daily.   omeprazole 40 MG capsule Commonly known as:  PRILOSEC TAKE 1 CAPSULE BY MOUTH DAILY. What changed:  See the new instructions.   oxybutynin 15 MG 24 hr tablet Commonly known as:  DITROPAN XL Take 15 mg by mouth at bedtime.   QC ANTI-DIARRHEAL 2 MG tablet Generic drug:  loperamide Take 2 mg by mouth 4 (four) times daily as needed for diarrhea or loose stools.   QC ARTHRITIS PAIN RELIEF 650 MG CR tablet Generic drug:  acetaminophen Take 1,300 mg by mouth daily as needed for pain.   trolamine salicylate 10 % cream Commonly known as:  ASPERCREME Apply 1 application topically 2 (two) times daily as needed for muscle pain.       Allergies  Allergen Reactions  . Statins Other (See Comments)    Unknown reaction  . Diltiazem Hcl Other (See Comments)    REACTION: feeling drunk    Consultations:  None  Procedures/Studies: Dg Chest 2 View  Result Date: 08/07/2016 CLINICAL DATA:  [place] last night with shortness of breath and dizziness, mid low back pain, history of pancreatic carcinoma EXAM: CHEST  2 VIEW COMPARISON:  Chest x-ray of 03/11/2015, and PET-CT of 07/13/2015 FINDINGS: No active infiltrate or effusion is seen. No pneumothorax is noted. Mediastinal and hilar contours are unremarkable. Mild cardiomegaly is stable. There is mild degenerative change in the lower thoracic spine. No compression deformity is seen. IMPRESSION: No active lung disease.  No acute abnormality. Electronically Signed   By: Ivar Drape M.D.   On: 08/07/2016  12:19   Dg Lumbar Spine 2-3 Views  Result Date: 08/07/2016 CLINICAL DATA:  [place] at home last night, cough, shortness of breath, history of pancreatic carcinoma EXAM: LUMBAR SPINE - 2-3 VIEW COMPARISON:  CT abdomen pelvis of 06/20/2015 and lumbar spine films of 05/08/2015 FINDINGS: A common bile duct stent is present and clips are present from prior cholecystectomy. The lumbar vertebrae remain in normal alignment. Intervertebral disc spaces appear normal. No compression deformity is seen. The SI joints are unremarkable. IMPRESSION: Normal alignment with normal disc spaces. No acute compression deformity. Electronically Signed   By: Ivar Drape M.D.   On: 08/07/2016 12:21   Ct Head Wo Contrast  Result Date: 08/07/2016 CLINICAL DATA:  Leg weakness for several months, generalized swelling in BILATERAL lower extremities, slid to the floor at 0330 hours and remained on floor since, found this morning by family, no loss of consciousness, history hypertension, stage III chronic kidney disease EXAM: CT HEAD WITHOUT CONTRAST TECHNIQUE: Contiguous axial images were obtained from the base of the skull through the vertex without intravenous contrast. COMPARISON:  05/08/2015 FINDINGS: Brain: Generalized atrophy. Normal ventricular  morphology. No midline shift or mass effect. Small vessel chronic ischemic changes of deep cerebral white matter. No intracranial hemorrhage, mass lesion, evidence of acute infarction, or extra-axial fluid collection. Vascular: Atherosclerotic calcifications at the carotid siphons Skull: Osseous demineralization. Hyperostosis frontalis interna. Intact. Sinuses/Orbits: Visualized paranasal sinuses and mastoid air cells clear Other: N/A IMPRESSION: Atrophy with small vessel chronic ischemic changes of deep cerebral white matter. No acute intracranial abnormalities. Electronically Signed   By: Lavonia [place] M.D.   On: 08/07/2016 13:01   US Renal  Result Date: 08/19/2016 CLINICAL DATA:  Acute renal  failure. EXAM: RENAL / URINARY TRACT ULTRASOUND COMPLETE COMPARISON:  None. FINDINGS: Right Kidney: Length: 8.7 cm. Renal cortical thinning . Echogenicity within normal limits. No mass or hydronephrosis visualized. Left Kidney: Length: 9.2 cm. Renal cortical thinning. Echogenicity within normal limits. No mass or hydronephrosis visualized. Bladder: Appears normal for degree of bladder distention. Mild tomato ascites. Left pleural effusion noted. IMPRESSION: 1. Bilateral renal cortical thinning. No hydronephrosis or focal acute abnormality. 2.  Mild to moderate ascites.  Left pleural effusion . Electronically Signed   By: Marcello Moores  Register   On: 08/19/2016 15:45   Dg Hip Unilat W Or Wo Pelvis 2-3 Views Left  Result Date: 08/17/2016 CLINICAL DATA:  Left hip pain after a fall. History of lower extremity weakness for 3 months. Multiple previous falls. History of pancreatic cancer. EXAM: DG HIP (WITH OR WITHOUT PELVIS) 2-3V LEFT COMPARISON:  None. FINDINGS: No evidence of acute fracture or dislocation of the left hip. Pelvis and visualized sacrum appear intact. No focal bone lesion or bone destruction. SI joints and symphysis pubis are not displaced. Degenerative changes in the lower lumbar spine and in both hips. There is suggestion of infiltration in the subcutaneous fat lateral to the left hip probably representing hematoma. IMPRESSION: No acute bony abnormalities. Degenerative changes. Probable hematoma in the soft tissues lateral to the left hip. Electronically Signed   By: Lucienne Capers M.D.   On: 08/17/2016 00:18    Subjective: Pt without complaints this morning. Slept well. No abd pain, N/V/D.   Discharge Exam: Vitals:   08/19/16 2100 08/20/16 0622  BP: (!) 111/50 (!) 145/49  Pulse: 68 66  Resp: 16 16  Temp: 98.2 F (36.8 C) 98 F (36.7 C)   Vitals:   08/19/16 0413 08/19/16 1353 08/19/16 2100 08/20/16 0622  BP: (!) 127/46 (!) 118/56 (!) 111/50 (!) 145/49  Pulse: 66 70 68 66  Resp: 16 16  16 16   Temp: 98.6 F (37 C) 98.1 F (36.7 C) 98.2 F (36.8 C) 98 F (36.7 C)  TempSrc: Oral Oral Oral Oral  SpO2: 98% 98% 99% 99%  Height:       General: Elderly F in no distress Cardiovascular: RRR, S1/S2 +, no rubs, no gallops Respiratory: CTA bilaterally, no wheezing, no rhonchi Abdominal: Soft, NT, ND, bowel sounds + Extremities: Left hip with ~2cm diameter round ecchymosis overlying greater trochanter not tender to palpation. Hip with full ROM. Compartment soft. Cap refill brisk throughout.  Labs: Basic Metabolic Panel:  Recent Labs Lab 08/17/16 0023 08/17/16 0414 08/18/16 0513 08/19/16 0413 08/20/16 0409  NA 144 143 144 144 142  K 3.8 3.9 4.1 4.4 4.2  CL 110 110 114* 113* 114*  CO2 24 24 21* 21* 21*  GLUCOSE 104* 99 128* 100* 118*  BUN 50* 50* 48* 49* 50*  CREATININE 3.75* 3.60* 3.32* 3.14* 2.97*  CALCIUM 8.5* 8.1* 8.1* 7.8* 7.9*   CBC:  Recent  Labs Lab 08/17/16 0023 08/17/16 0414 08/17/16 1022 08/17/16 1531 08/17/16 2151 08/18/16 0513  WBC 5.4 4.9  --   --   --  5.2  NEUTROABS 2.7  --   --   --   --   --   HGB 9.3* 8.8* 9.3* 9.3* 8.8* 9.5*  HCT 28.2* 26.7* 28.6* 28.5* 26.7* 29.1*  MCV 86.5 87.8  --   --   --  89.0  PLT 135* 136*  --   --   --  136*   CBG:  Recent Labs Lab 08/19/16 1201 08/19/16 1712 08/19/16 2144 08/20/16 0745 08/20/16 1136  GLUCAP 153* 106* 123* 110* 131*   Urinalysis    Component Value Date/Time   COLORURINE AMBER (A) 08/17/2016 [place] 08/17/2016 0233   LABSPEC 1.017 08/17/2016 0233   PHURINE 5.0 08/17/2016 [place] 08/17/2016 0233   HGBUR NEGATIVE 08/17/2016 0233   HGBUR negative 05/29/2009 1048   BILIRUBINUR NEGATIVE 08/17/2016 0233   BILIRUBINUR moderate (A) 06/19/2015 1553   BILIRUBINUR n 04/20/2012 1301   KETONESUR NEGATIVE 08/17/2016 0233   PROTEINUR NEGATIVE 08/17/2016 0233   UROBILINOGEN 1.0 06/19/2015 1553   UROBILINOGEN 0.2 11/09/2009 1815   NITRITE NEGATIVE  08/17/2016 0233   LEUKOCYTESUR NEGATIVE 08/17/2016 0233   Time coordinating discharge: Over 30 minutes  Vance Gather, MD  [facility] 08/20/2016, 12:13 PM Pager [phone]

## 2016-08-20 NOTE — Social Work (Signed)
LCSWA met with patient (pt) and Sonia Side (nephew) in room. Pt is drowsy, sleeping. Pt appeared comfortable. LCSWA and Sonia Side discussed plan for possible discharge from hospital today. Plan is for [facility]OT. LCSWA and Sonia Side discussed pt/family decision and choice to revoke hospice services. LCSWA completed revocation paperwork with Sonia Side. Sonia Side verbalized understanding of how to contact [facility] with questions/concerns. LCSWA will notify [facility] team. LCSWA spoke with Amy, hospital liaison, regarding plan. LCSWA and Sonia Side also consulted Roselyn Reef, [place], [place] [phone] ext. [phone]

## 2016-08-20 NOTE — Progress Notes (Signed)
[facility]- [facility] [place] - GIP RN Visit  This is a related, covered GIP admission, per MD [facility]. She was admitted to [facility] [place] on 08/22/15 with Pancreatic CA. Patient is a DNR. She was admitted to [facility] after sustaining a fall at home. She reported that she had been experiencing increasing bilateral lower extremity weakness. She also notes that she has been having intermittent diarrhea. Admission date of 08/17/16 for Acute Kidney Injury.   Patient visited in room, sitting up in bed.  Patient had dozed off, so a little sleepy, but oriented and pleasant.  Patient reported no pain.  Patient is on RA with normal O2 sats and respirations.  Patient just ate breakfast prior to my arrival and had slipped off to sleep.  Patient was noticed to have pocketed her food in her mouth and was chewing on same.  I advised Legrand Rams, RN and voiced concern over the pocketed food.  Patient still hopeful to be discharged to a facility to further work with Pt.  It is my understanding that patient will revoke hospice services today to go to SNF.  Please call with any hospice-related questions or concerns.  Thank you, Edyth Gunnels RN, [place] [facility] Liaison [phone]  [facility] hospice liaison are now on [address].  Please feel free to call me directly at the number above or call hospice at [phone] after 5pm.

## 2016-08-21 LAB — BASIC METABOLIC PANEL
BUN: 47 mg/dL — AB (ref 4–21)
CREATININE: 2.7 mg/dL — AB (ref 0.5–1.1)
Glucose: 124 mg/dL
Potassium: 4.7 mmol/L (ref 3.4–5.3)
Sodium: 147 mmol/L (ref 137–147)

## 2016-08-21 LAB — CBC AND DIFFERENTIAL
HCT: 36 % (ref 36–46)
Hemoglobin: 10.8 g/dL — AB (ref 12.0–16.0)
PLATELETS: 168 10*3/uL (ref 150–399)
WBC: 7.2 10*3/mL

## 2016-08-22 ENCOUNTER — Encounter: Payer: Self-pay | Admitting: Internal Medicine

## 2016-08-22 ENCOUNTER — Non-Acute Institutional Stay (SKILLED_NURSING_FACILITY): Payer: Medicare Other | Admitting: Internal Medicine

## 2016-08-22 DIAGNOSIS — S7002XD Contusion of left hip, subsequent encounter: Secondary | ICD-10-CM

## 2016-08-22 DIAGNOSIS — C250 Malignant neoplasm of head of pancreas: Secondary | ICD-10-CM | POA: Diagnosis not present

## 2016-08-22 DIAGNOSIS — K529 Noninfective gastroenteritis and colitis, unspecified: Secondary | ICD-10-CM

## 2016-08-22 DIAGNOSIS — N183 Chronic kidney disease, stage 3 unspecified: Secondary | ICD-10-CM

## 2016-08-22 DIAGNOSIS — R4182 Altered mental status, unspecified: Secondary | ICD-10-CM | POA: Insufficient documentation

## 2016-08-22 DIAGNOSIS — D649 Anemia, unspecified: Secondary | ICD-10-CM

## 2016-08-22 NOTE — Assessment & Plan Note (Signed)
Creatinine continues to improve and is presently 2.73, still above her baseline Med list reviewed for any potentially nephrotoxic agents, gabapentin is low-dose

## 2016-08-22 NOTE — Assessment & Plan Note (Signed)
Hospice will be involved @ SNF as needed

## 2016-08-22 NOTE — Assessment & Plan Note (Signed)
Discontinue PT/OT Oncologic reassessment of pancreatic cancer status if active intervention/treatment  is requested. Clinically Hospice should be involved

## 2016-08-22 NOTE — Progress Notes (Signed)
This is a comprehensive admission note to [facility] performed on this date less than 30 days from date of admission. Included are preadmission medical/surgical history;reconciled medication list; family history; social history and comprehensive review of systems.  Corrections and additions to the records were documented . Comprehensive physical exam was also performed. Additionally a clinical summary was entered for each active diagnosis pertinent to this admission in the Problem List to enhance continuity of care.  PCP: Legrand Pitts FP  HPI: The patient was hospitalized 1/12-1/16/18, admitted from home for treatment of left hip hematoma sustained I an unwitnessed fall. Additionally she was found to have acute on chronic renal insufficiency with a creatinine of 3.75. Baseline was approximately 1.3. No hip fracture was documented on CT scan. With Lasix and IV fluids creatinine progressively improved. Hypotension persisted. Anemia remained stable. Chronic diarrhea has continued. This is in the context of her pancreatic cancer for which she is on Hospice . Subjectively the diarrhea did improve and was reported as having resolved Because of instability PT recommended SNF placement according to the discharge note. According to the SNF staff, patient had been under the care of Hospice but  physical therapy cannot be pursued if someone is under hospice care. It apparently was the family's wish that PT be pursued. Review of the chart indicates that she probably had slid out of her bed to the floor 08/07/16. There was apparently no loss of consciousness or head trauma. (See physical exam below) .CT of the brain revealed generalized atrophy no bleed or mass effect. Marland Kitchen  Hospital labs @ discharge revealed creatinine of 2.97, hemoglobin 9.5, and hematocrit 29.1. Follow-up labs 08/21/16 revealed hemoglobin 10.8 hematocrit 36. Significant renal insufficiency persists with a creatinine of  2.73. Mild hyponatremia with a value of 147 and hyperglycemia with a glucose of 124 were noted.  PMH: Peripheral vascular disease, renal insufficiency, osteopenia, hypertension, dyslipidemia, GERD, and depression. Significant surgeries include parathyroidectomy and cholecystectomy. The last Epic entry concerning her pancreatic cancer was 08/22/15 by Dr. Irene Limbo, Oncologist. The patient did not wish to pursue palliative chemotherapy, preferring to focus on best supportive care and she wished to spend her final days with her family @ home. Hospice was pursued @ that time.  Social history: Nonsmoker, nondrinker  Family history: Reviewed  Review of systems:Could not be completed due to poor verbal communication. Questions were answered with "okay" or with a stuttering unintelligible speech  pattern. I specifically asked whether she was having diarrhea and the answer was "okay". She was unable to give me the date.   Positive physical findings: As noted the patient was essentially nonverbal. She did follow simple commands such as lifting her extremities. Otherwise the patient had a blank stare. Her eyebrows are essentially absent. There is resolving ecchymosis over the left forehead. Oral exam reveals prominence of the maxilla. Her mouth is coated with dry mucoid material. She has an upper partial. Bilateral ptosis is noted. She has a grade 1 systolic murmur. Breath sounds are decreased. There is dullness to percussion in the right upper quadrant without definite organomegaly. Ventral hernia is present. She has massive lymphedema with 1+ pitting. Pedal pulses are decreased. Toenails are thickened and deformed.  Neck:  No thyromegaly, masses, tenderness noted.    Heart:  Normal rate and regular rhythm. S1 and S2 normal without gallop, click, rub .  Lungs:Chest clear to auscultation without wheezes, rhonchi,rales , rubs. Abdomen:Bowel sounds are normal. Abdomen is soft and nontender  GU:  deferred    Extremities:  No cyanosis, clubbing  Neurologic exam : Strength decreased  in upper & lower extremities Balance,Rhomberg,finger to nose testing could not be completed due to clinical state Skin: Warm & dry w/o tenting. No significant lesions or rash. Minor scattered UE ecchymoses present  #1 altered mental status with profound verbal communication deficit. This precludes PT/OT participation #2 pancreatic cancer, untreated to date. Before any aggressive or interventional therapy is pursued, reconsultation with Oncology is indicated. Pursuing physical therapy is grossly unrealistic and unfair to this frail patient who is at risk for additional falls and musculoskeletal or CNS injury.  Megace has been associated with confusion and other neurologic symptoms as well as diarrhea. It will be discontinued.   See clinical summary under each active problem in the Problem List with associated updated therapeutic plan

## 2016-08-22 NOTE — Patient Instructions (Signed)
See Current Assessment & Plan in Problem List under specific Diagnosis 

## 2016-08-22 NOTE — Assessment & Plan Note (Signed)
PT/OT to address balance and strength deficits

## 2016-08-22 NOTE — Assessment & Plan Note (Signed)
08/21/16 hemoglobin 10.8/hematocrit 36, significantly improved

## 2016-08-22 NOTE — Assessment & Plan Note (Signed)
Continue Creon As needed antidiarrheal

## 2016-08-27 ENCOUNTER — Other Ambulatory Visit: Payer: Self-pay | Admitting: *Deleted

## 2016-08-27 NOTE — Patient Outreach (Signed)
[facility]) Care Management  08/27/2016  Linda Bentley 18-Nov-1928 [id number]   Met with SW at facility. She reports that patient is long term care resident, with no plans to discharge. She states patient has had Hospice in the past, but was discharged from Hospice.   Plan to sign off case.   Royetta Crochet. Laymond Purser, RN, BSN, [place] [phone]) Business Cell  [phone]) Toll Free Office

## 2016-08-29 ENCOUNTER — Encounter: Payer: Self-pay | Admitting: Internal Medicine

## 2016-08-29 ENCOUNTER — Non-Acute Institutional Stay (SKILLED_NURSING_FACILITY): Payer: Medicare Other | Admitting: Internal Medicine

## 2016-08-29 DIAGNOSIS — C259 Malignant neoplasm of pancreas, unspecified: Secondary | ICD-10-CM

## 2016-08-29 DIAGNOSIS — N183 Chronic kidney disease, stage 3 unspecified: Secondary | ICD-10-CM

## 2016-08-29 DIAGNOSIS — D649 Anemia, unspecified: Secondary | ICD-10-CM

## 2016-08-29 DIAGNOSIS — C250 Malignant neoplasm of head of pancreas: Secondary | ICD-10-CM

## 2016-08-29 NOTE — Patient Instructions (Signed)
See Current Assessment & Plan in Problem List under specific Diagnosis 

## 2016-08-29 NOTE — Assessment & Plan Note (Addendum)
Recheck renal function as prelude to repeating the PET scan Med list reviewed, no nephrotoxic agents found except for gabapentin 100 milligrams 3 times a day Decrease gabapentin 200 mg daily

## 2016-08-29 NOTE — Assessment & Plan Note (Signed)
Repeat CBC and differential

## 2016-08-29 NOTE — Progress Notes (Signed)
    This is a nursing facility follow up of chronic medical diagnosis of pancreatic cancer.  Interim medical record and care since last [place] visit was updated with review of diagnostic studies and change in clinical status since last visit were documented.  HPI: The patient was diagnosed with pancreatic cancer November 2016. On 06/22/2015 ERCP was performed and a stricture in the mid common bile duct was stented. Brushings were taken which revealed adenocarcinoma of the head of the pancreas.Dr Irene Limbo, Oncologist was consulted 11/29. The patient expressed uncertainty about having major surgery at that time were it recommended based on PET/CT scanning. Following the stenting her clay colored stools improved as did bilirubin and alkaline phosphatase levels. On 11/29 the decreased from 1152 down to 399. AST and ALT elevations had resolved.  PET scan 07/13/15 revealed a 2.3 x 3.3 cm hypermetabolic mass in the head of the pancreas area; there was no evidence of metastatic disease at that time.  The last hepatic studies performed were 08/22/15. Total bilirubin was 0.61, alk phosphatase 66, and both AST and ALT normal The patient did have a history of pancreatitis in 6578, nonalcoholic. 08/22/15 creatinine was 1.1,  3.14 on 08/19/16.  Review of systems: The patient does describe a 40 pound weight loss over the last year. She also has persistent diarrhea with 3 or more stools a day. She denies any other active symptoms. Constitutional: No fever, fatigue  Eyes: No redness, discharge, pain, vision change ENT/mouth: No nasal congestion,  purulent discharge, earache,change in hearing ,sore throat  Cardiovascular: No chest pain, palpitations,paroxysmal nocturnal dyspnea, claudication, edema  Respiratory: No cough, sputum production,hemoptysis, DOE , significant snoring,apnea  Gastrointestinal: No heartburn,dysphagia,abdominal pain, nausea / vomiting,rectal bleeding, melena Genitourinary: No  dysuria,hematuria, pyuria,  incontinence, nocturia Musculoskeletal: No joint stiffness, joint swelling, weakness,pain Dermatologic: No rash, pruritus, change in appearance of skin Neurologic: No dizziness,headache,syncope, seizures, numbness , tingling Psychiatric: No significant anxiety , depression, insomnia, anorexia Endocrine: No change in hair/skin/ nails, excessive thirst, excessive hunger, excessive urination  Hematologic/lymphatic: No significant bruising, lymphadenopathy,abnormal bleeding Allergy/immunology: No itchy/ watery eyes, significant sneezing, urticaria, angioedema  Physical exam:  Pertinent or positive findings: She is alert, oriented, and communicative. Eyebrows are absent. She has a resolving ecchymosis over the left for head. There is slight ptosis of the left eye. She has an upper partial. Maxillary anterior teeth are prominent. Abdomen is protuberant but nontender. She has 2+ edema. Pedal pulses are decreased.  General appearance:Adequately nourished; no acute distress , increased work of breathing is present.   Lymphatic: No lymphadenopathy about the head, neck, axilla . Eyes: No conjunctival inflammation or lid edema is present. There is no scleral icterus. Ears:  External ear exam shows no significant lesions or deformities.   Nose:  External nasal examination shows no deformity or inflammation. Nasal mucosa are pink and moist without lesions ,exudates Oral exam: lips and gums are healthy appearing.There is no oropharyngeal erythema or exudate . Neck:  No thyromegaly, masses, tenderness noted.    Heart:  Normal rate and regular rhythm. S1 and S2 normal without gallop, murmur, click, rub .  Lungs:Chest clear to auscultation without wheezes, rhonchi,rales , rubs. Abdomen:Bowel sounds are normal. Abdomen is soft and nontender with no organomegaly, hernias,masses. GU: deferred  Extremities:  No cyanosis, clubbing Skin: Warm & dry w/o tenting. No significant lesions or  rash.  See summary under each active problem in the Problem List with associated updated therapeutic plan

## 2016-08-29 NOTE — Assessment & Plan Note (Signed)
Repeat chemistries and PET scan as her clinical course belies expected outcome of death within 6 months without treatment This was discussed with her and her power of attorney and they're in agreement.

## 2016-08-30 LAB — BASIC METABOLIC PANEL
BUN: 32 mg/dL — AB (ref 4–21)
Creatinine: 1.9 mg/dL — AB (ref 0.5–1.1)
GLUCOSE: 113 mg/dL
Potassium: 2.9 mmol/L — AB (ref 3.4–5.3)
SODIUM: 141 mmol/L (ref 137–147)

## 2016-08-30 LAB — HEPATIC FUNCTION PANEL
ALT: 17 U/L (ref 7–35)
AST: 20 U/L (ref 13–35)
Alkaline Phosphatase: 74 U/L (ref 25–125)
BILIRUBIN, TOTAL: 1.1 mg/dL

## 2016-08-30 LAB — CBC AND DIFFERENTIAL
HEMATOCRIT: 33 % — AB (ref 36–46)
Hemoglobin: 10.5 g/dL — AB (ref 12.0–16.0)
Platelets: 150 10*3/uL (ref 150–399)
WBC: 7.3 10^3/mL

## 2016-09-02 LAB — CBC AND DIFFERENTIAL
HEMATOCRIT: 34 % — AB (ref 36–46)
Hemoglobin: 11 g/dL — AB (ref 12.0–16.0)
PLATELETS: 199 10*3/uL (ref 150–399)
WBC: 10 10*3/mL

## 2016-09-03 ENCOUNTER — Encounter: Payer: Self-pay | Admitting: Internal Medicine

## 2016-09-03 ENCOUNTER — Non-Acute Institutional Stay (SKILLED_NURSING_FACILITY): Payer: Medicare Other | Admitting: Internal Medicine

## 2016-09-03 ENCOUNTER — Other Ambulatory Visit (HOSPITAL_COMMUNITY): Payer: Self-pay | Admitting: Internal Medicine

## 2016-09-03 ENCOUNTER — Other Ambulatory Visit: Payer: Self-pay | Admitting: Internal Medicine

## 2016-09-03 DIAGNOSIS — K625 Hemorrhage of anus and rectum: Secondary | ICD-10-CM | POA: Diagnosis not present

## 2016-09-03 DIAGNOSIS — E876 Hypokalemia: Secondary | ICD-10-CM | POA: Diagnosis not present

## 2016-09-03 DIAGNOSIS — C259 Malignant neoplasm of pancreas, unspecified: Secondary | ICD-10-CM

## 2016-09-03 MED ORDER — VITAMIN D 1000 UNITS PO TABS: 1000.0000 [IU] | ORAL_TABLET | Freq: Every day | ORAL | Status: DC

## 2016-09-03 MED ORDER — POTASSIUM CHLORIDE 20 MEQ PO PACK: 20.0000 meq | PACK | Freq: Two times a day (BID) | ORAL | Status: DC

## 2016-09-03 MED ORDER — CALCIUM 600 MG PO TABS: 600.0000 mg | ORAL_TABLET | Freq: Two times a day (BID) | ORAL | 0 refills | Status: DC

## 2016-09-03 NOTE — Progress Notes (Signed)
    Facility Location: [facility]  Room Number: 119-A   Code Status:   PCP: Drema Pry, DO [place] [address]  This is a nursing facility follow up for specific acute issue of Rectal bleeding.  Interim medical record and care since last [place] visit was updated with review of diagnostic studies and change in clinical status since last visit were documented.  HPI: The patient states that some blood was noted on the floor 1/29 after she walked to the bathroom. Stalf reports stools being very loose with stool continence; the patient says stools are clay colored and formed. PT/OT staff stated that patient was lightheaded and dizzy and lightheaded walking to bathroom today. Blood pressure was 90/57 with a pulse of 88. Other than easy bruising, the patient denies any bleeding dyscrasias. Hemoglobin/hematocrit were 10.5/32.7 on 1/26. Repeat 1/29 revealed values of 11/33.8. Creatinine was 2.97 on 1/16, repeat 1/26 revealed a value of 1.90. This was done in prelude to repeat CT/PET scan of the pancreas. Calcium was 7.8, potassium 2.9, and albumin 2.7. She had the ERCP in November 2016 when brushings revealed adenocarcinoma of the pancreas. Colonoscopy was not performed.  Review of systems: Epistaxis, hemoptysis, hematuria, melena denied. No unexplained weight loss, significant dyspepsia,dysphagia, or abdominal pain.  There is no other abnormal bleeding, or difficulty stopping bleeding with injury.  Physical exam:  Pertinent or positive findings: She does appear pale. There is a scabbing fever blister over the right upper lip. First heart sound is accentuated. Bowel sounds are decreased. She has 2+ edema at the sock line. Lipedema is also present. Pedal pulses are not palpable.  General appearance:Adequately nourished; no acute distress , increased work of breathing is present.   Lymphatic: No lymphadenopathy about the head,  neck, axilla . Eyes: No conjunctival inflammation or lid edema is present. There is no scleral icterus. Ears:  External ear exam shows no significant lesions or deformities.   Nose:  External nasal examination shows no deformity or inflammation. Nasal mucosa are pink and moist without lesions ,exudates Oral exam: lips and gums are healthy appearing.There is no oropharyngeal erythema or exudate . Neck:  No thyromegaly, masses, tenderness noted.    Heart:  Normal rate and regular rhythm. S2 normal without gallop, murmur, click, rub .  Lungs:Chest clear to auscultation without wheezes, rhonchi,rales , rubs. Abdomen:Abdomen is soft and nontender with no organomegaly, hernias,masses. GU: deferred  Extremities:  No cyanosis, clubbing  Skin: Warm & dry w/o tenting. No significant lesions or rash.  Assessment/plan: #1 rectal bleeding. Monitor CBC #2 chemistry & electrolyte  imbalance. See orders.

## 2016-09-03 NOTE — Assessment & Plan Note (Signed)
Vitamin D and calcium supplementation

## 2016-09-03 NOTE — Patient Instructions (Signed)
See Current Assessment & Plan under specific Diagnosis 

## 2016-09-04 LAB — BASIC METABOLIC PANEL
BUN: 34 mg/dL — AB (ref 4–21)
Creatinine: 2.1 mg/dL — AB (ref 0.5–1.1)
GLUCOSE: 114 mg/dL
Potassium: 3.2 mmol/L — AB (ref 3.4–5.3)
SODIUM: 144 mmol/L (ref 137–147)

## 2016-09-04 LAB — CBC AND DIFFERENTIAL
HEMATOCRIT: 30 % — AB (ref 36–46)
HEMOGLOBIN: 9.5 g/dL — AB (ref 12.0–16.0)
PLATELETS: 136 10*3/uL — AB (ref 150–399)
WBC: 6.6 10^3/mL

## 2016-09-05 ENCOUNTER — Encounter: Payer: Self-pay | Admitting: *Deleted

## 2016-09-06 ENCOUNTER — Encounter: Payer: Self-pay | Admitting: Nurse Practitioner

## 2016-09-06 ENCOUNTER — Non-Acute Institutional Stay (SKILLED_NURSING_FACILITY): Payer: Medicare Other | Admitting: Nurse Practitioner

## 2016-09-06 DIAGNOSIS — D649 Anemia, unspecified: Secondary | ICD-10-CM

## 2016-09-06 DIAGNOSIS — C250 Malignant neoplasm of head of pancreas: Secondary | ICD-10-CM

## 2016-09-06 DIAGNOSIS — R1111 Vomiting without nausea: Secondary | ICD-10-CM

## 2016-09-06 DIAGNOSIS — R600 Localized edema: Secondary | ICD-10-CM | POA: Diagnosis not present

## 2016-09-06 DIAGNOSIS — K529 Noninfective gastroenteritis and colitis, unspecified: Secondary | ICD-10-CM

## 2016-09-06 NOTE — Progress Notes (Signed)
Nursing Home Location:  [facility] Room: 119  Place of Service: SNF (31)  PCP: Drema Pry, DO  Allergies  Allergen Reactions  . Statins Other (See Comments)    Unknown reaction  . Diltiazem Hcl Other (See Comments)    REACTION: feeling drunk    Chief Complaint  Patient presents with  . Acute Visit    Vomiting    HPI:  Patient is a 83 y.o. female seen today at [facility] at the request of nursing. Pt with hx of Peripheral vascular disease, renal insufficiency, osteopenia, hypertension, dyslipidemia, GERD, and depression. Pt with  pancreatic cancer and previously on hospice until recent fall. Unable to have therapy on hospice so she is not on that service at this time. Pt also with chronic diarrhea.  Significant surgeries include parathyroidectomy and cholecystectomy. Staff noted pt was recently started on calcium twice daily and has had vomiting episodes ever since x 2 days. Today pt reports she has not had vomiting episode in over 24 hours. No nausea or abdominal pain.  Pt reports decrease in appetite but this has been ongoing for about 2 weeks. Does eat some at each meal, food at [place] not very appealing to her.  Pt also with hx of pancreatic cancer, scheduled PET scan for next week Pt had episode of rectal bleeding, follow up labs have not been done but no other bleeding episodes noted.   Review of Systems:  Review of Systems  Constitutional: Negative for activity change, appetite change, fatigue and unexpected weight change.  HENT: Negative for congestion and hearing loss.   Eyes: Negative.   Respiratory: Negative for cough and shortness of breath.   Cardiovascular: Positive for leg swelling. Negative for chest pain and palpitations.  Gastrointestinal: Positive for diarrhea (chronic, slightly improved). Negative for abdominal pain and constipation.  Genitourinary: Negative for difficulty urinating and dysuria.  Musculoskeletal: Negative for  arthralgias and myalgias.  Skin: Negative for color change and wound.  Neurological: Negative for dizziness and weakness.  Psychiatric/Behavioral: Negative for agitation, behavioral problems and confusion.    Past Medical History:  Diagnosis Date  . ANEMIA-UNSPECIFIED 07/03/2009  . Depression   . GERD (gastroesophageal reflux disease)   . Hiatal hernia   . HYPERGLYCEMIA 02/26/2007  . Hyperlipidemia   . Hypertension   . LBP (low back pain)   . Osteopenia   . Renal insufficiency 2007   creat 1.3  . UNSPECIFIED PERIPHERAL VASCULAR DISEASE 06/08/2010   Past Surgical History:  Procedure Laterality Date  . APPENDECTOMY  1962  . CHOLECYSTECTOMY    . [place]  . ERCP N/A 06/22/2015   Procedure: ENDOSCOPIC RETROGRADE CHOLANGIOPANCREATOGRAPHY (ERCP);  Surgeon: Ladene Artist, MD;  Location: [facility];  Service: Endoscopy;  Laterality: N/A;  . ESOPHAGOGASTRODUODENOSCOPY     with dilatation Dr Fuller Plan  . EUS N/A 07/27/2015   Procedure: UPPER ENDOSCOPIC ULTRASOUND (EUS) RADIAL;  Surgeon: [name], MD;  Location: [facility];  Service: Endoscopy;  Laterality: N/A;  . ingrown toenails      x3  . OOPHORECTOMY  1962  . PARATHYROIDECTOMY    . tail bone surgery    . TONSILLECTOMY AND ADENOIDECTOMY    . WRIST FRACTURE SURGERY  10/01   Social History:   reports that she has never smoked. She has never used smokeless tobacco. She reports that she does not drink alcohol or use drugs.  Family History  Problem Relation Age of Onset  .  Kidney disease Father     failure  . Colon cancer Sister     Medications: Patient's Medications  New Prescriptions   No medications on file  Previous Medications   CALCIUM CARBONATE (OS-CAL) 600 MG TABLET    Take 1 tablet (600 mg total) by mouth 2 (two) times daily.   CALCIUM CARBONATE (TUMS - DOSED IN MG ELEMENTAL CALCIUM) 500 MG CHEWABLE TABLET    Chew 1 tablet by mouth every 4 (four) hours as needed for indigestion  or heartburn.    CITALOPRAM (CELEXA) 20 MG TABLET    TAKE 1 TABLET BY MOUTH DAILY.   FUROSEMIDE (LASIX) 40 MG TABLET    Take 40 mg by mouth daily.   GABAPENTIN (NEURONTIN) 100 MG CAPSULE    Take 100 mg by mouth at bedtime.    LIPASE/PROTEASE/AMYLASE (CREON) 12000 UNITS CPEP CAPSULE    Take 1 capsule (12,000 Units total) by mouth 3 (three) times daily before meals.   OMEPRAZOLE (PRILOSEC) 40 MG CAPSULE    TAKE 1 CAPSULE BY MOUTH DAILY.   OXYBUTYNIN (DITROPAN XL) 15 MG 24 HR TABLET    Take 15 mg by mouth at bedtime.   QC ANTI-DIARRHEAL 2 MG TABLET    Take 2 mg by mouth 4 (four) times daily as needed for diarrhea or loose stools.   QC ARTHRITIS PAIN RELIEF 650 MG CR TABLET    Take 1,300 mg by mouth daily as needed for pain.   TROLAMINE SALICYLATE (ASPERCREME) 10 % CREAM    Apply 1 application topically 2 (two) times daily as needed for muscle pain.  Modified Medications   No medications on file  Discontinued Medications   LORAZEPAM (ATIVAN) 0.5 MG TABLET    Take 1 tablet (0.5 mg total) by mouth daily as needed. for anxiety     Physical Exam: Vitals:   09/06/16 1105  BP: 104/60  Pulse: 88  Resp: 18  Temp: 97.7 F (36.5 C)  SpO2: 98%  Weight: 194 lb (88 kg)  Height: 5\' 1"  (1.549 m)    Physical Exam  Constitutional: She is oriented to person, place, and time. She appears well-developed and well-nourished. No distress.  HENT:  Head: Normocephalic and atraumatic.  Mouth/Throat: Oropharynx is clear and moist. No oropharyngeal exudate.  Eyes: Conjunctivae are normal. Pupils are equal, round, and reactive to light.  Neck: Normal range of motion. Neck supple.  Cardiovascular: Normal rate, regular rhythm and normal heart sounds.   Pulmonary/Chest: Effort normal and breath sounds normal.  Abdominal: Soft. Bowel sounds are normal.  Musculoskeletal: She exhibits edema (2+ bilaterally). She exhibits no tenderness.  Neurological: She is alert and oriented to person, place, and time.  Skin:  Skin is warm and dry. She is not diaphoretic.  Psychiatric: She has a normal mood and affect.    Labs reviewed: Basic Metabolic Panel:  Recent Labs  08/18/16 0513 08/19/16 0413 08/20/16 0409 08/21/16 08/30/16 09/04/16  NA 144 144 142 147 141 144  K 4.1 4.4 4.2 4.7 2.9* 3.2*  CL 114* 113* 114*  --   --   --   CO2 21* 21* 21*  --   --   --   GLUCOSE 128* 100* 118*  --   --   --   BUN 48* 49* 50* 47* 32* 34*  CREATININE 3.32* 3.14* 2.97* 2.7* 1.9* 2.1*  CALCIUM 8.1* 7.8* 7.9*  --   --   --    Liver Function Tests:  Recent Labs  08/30/16  AST 20  ALT 17  ALKPHOS 74   No results for input(s): LIPASE, AMYLASE in the last 8760 hours. No results for input(s): AMMONIA in the last 8760 hours. CBC:  Recent Labs  08/07/16 1143 08/17/16 0023 08/17/16 0414  08/18/16 0513  08/30/16 09/02/16 09/04/16  WBC 5.9 5.4 4.9  --  5.2  < > 7.3 10.0 6.6  NEUTROABS 3.6 2.7  --   --   --   --   --   --   --   HGB 10.9* 9.3* 8.8*  < > 9.5*  < > 10.5* 11.0* 9.5*  HCT 33.0* 28.2* 26.7*  < > 29.1*  < > 33* 34* 30*  MCV 88.9 86.5 87.8  --  89.0  --   --   --   --   PLT 153 135* 136*  --  136*  < > 150 199 136*  < > = values in this interval not displayed. TSH: No results for input(s): TSH in the last 8760 hours. A1C: Lab Results  Component Value Date   HGBA1C 10.7 (H) 07/28/2015   Lipid Panel: No results for input(s): CHOL, HDL, LDLCALC, TRIG, CHOLHDL, LDLDIRECT in the last 8760 hours.  Assessment/Plan 1. Vomiting without nausea, intractability of vomiting not specified, unspecified vomiting type Has resolved at this time. No nausea or vomiting noted in the last 24 hours.   2. Malignant neoplasm of head of pancreas ([place]) Pt scheduled for follow up PET scan next week.   3. Chronic diarrhea Has improved on creon  4. Lower leg edema Bilateral lower extremity edema, Will have staff apply compression hose in am and off in Pm, to elevate legs   5. Anemia, unspecified type Pt had noted  rectal bleeding but none recently. No other signs of bleeding  Will follow up [place]. Harle Battiest  [facility] [phone] 8 am - 5 pm) [phone] (after hours)

## 2016-09-12 ENCOUNTER — Ambulatory Visit (HOSPITAL_COMMUNITY)
Admission: RE | Admit: 2016-09-12 | Discharge: 2016-09-12 | Disposition: A | Discharge status: Home / Self Care | Payer: Medicare Other | Source: Ambulatory Visit | Attending: Internal Medicine | Admitting: Internal Medicine

## 2016-09-12 DIAGNOSIS — C259 Malignant neoplasm of pancreas, unspecified: Secondary | ICD-10-CM | POA: Diagnosis not present

## 2016-09-12 DIAGNOSIS — R188 Other ascites: Secondary | ICD-10-CM | POA: Insufficient documentation

## 2016-09-12 DIAGNOSIS — J90 Pleural effusion, not elsewhere classified: Secondary | ICD-10-CM | POA: Diagnosis not present

## 2016-09-12 DIAGNOSIS — X58XXXA Exposure to other specified factors, initial encounter: Secondary | ICD-10-CM | POA: Insufficient documentation

## 2016-09-12 DIAGNOSIS — I2510 Atherosclerotic heart disease of native coronary artery without angina pectoris: Secondary | ICD-10-CM | POA: Diagnosis not present

## 2016-09-12 DIAGNOSIS — I700 Atherosclerosis of aorta: Secondary | ICD-10-CM | POA: Insufficient documentation

## 2016-09-12 DIAGNOSIS — S2232XA Fracture of one rib, left side, initial encounter for closed fracture: Secondary | ICD-10-CM | POA: Insufficient documentation

## 2016-09-12 LAB — GLUCOSE, CAPILLARY: Glucose-Capillary: 116 mg/dL — ABNORMAL HIGH (ref 65–99)

## 2016-09-12 MED ORDER — FLUDEOXYGLUCOSE F - 18 (FDG) INJECTION
8.6200 | Freq: Once | INTRAVENOUS | Status: AC | PRN
  Administered 2016-09-12: 8.62 via INTRAVENOUS

## 2016-09-17 ENCOUNTER — Other Ambulatory Visit: Payer: Self-pay

## 2016-09-17 DIAGNOSIS — C257 Malignant neoplasm of other parts of pancreas: Secondary | ICD-10-CM

## 2016-10-16 ENCOUNTER — Other Ambulatory Visit: Payer: Self-pay | Admitting: *Deleted

## 2016-10-16 MED ORDER — MORPHINE SULFATE (CONCENTRATE) 20 MG/ML PO SOLN: ORAL | 0 refills | Status: AC

## 2016-10-16 NOTE — Telephone Encounter (Signed)
[facility] [phone] Fax: [phone]  

## 2016-10-18 ENCOUNTER — Non-Acute Institutional Stay (SKILLED_NURSING_FACILITY): Payer: Medicare Other | Admitting: Nurse Practitioner

## 2016-10-18 ENCOUNTER — Encounter: Payer: Self-pay | Admitting: Nurse Practitioner

## 2016-10-18 DIAGNOSIS — R600 Localized edema: Secondary | ICD-10-CM | POA: Diagnosis not present

## 2016-10-18 DIAGNOSIS — E876 Hypokalemia: Secondary | ICD-10-CM

## 2016-10-18 DIAGNOSIS — C250 Malignant neoplasm of head of pancreas: Secondary | ICD-10-CM | POA: Diagnosis not present

## 2016-10-18 DIAGNOSIS — N3946 Mixed incontinence: Secondary | ICD-10-CM | POA: Diagnosis not present

## 2016-10-18 NOTE — Progress Notes (Signed)
Nursing Home Location:  [facility] Room: 126 A  Place of Service: SNF (31)  PCP: Unice Cobble, MD  Allergies  Allergen Reactions  . Statins Other (See Comments)    Unknown reaction  . Diltiazem Hcl Other (See Comments)    REACTION: feeling drunk    Chief Complaint  Patient presents with  . Acute Visit    Resident is being seen due to pain in right foot.    HPI:  Patient is a 83 y.o. female seen today at [facility] for routine follow up. Pt with hx of Peripheral vascular disease, renal insufficiency, osteopenia, hypertension, dyslipidemia, GERD, and depression. Pt is currently on hospice services due to advance pancreatic cancer. Pt reports increased pain in epigastric area. Reports it is always there even with pain medication however pain medication does make pain more tolerable.  Ongoing nausea and vomiting. Pt conts to have dry heaves. Multiple nausea medications have been tired but none are effective.  Pt reports she is not eating. No appetite and food makes nausea/vomiting worse.    Review of Systems:  Review of Systems  Constitutional: Negative for activity change, appetite change and fatigue.       Significant weight loss due to progressive pancreatic cancer with decrease in appetite.   HENT: Negative for congestion and hearing loss.   Eyes: Negative.   Respiratory: Negative for cough and shortness of breath.   Cardiovascular: Negative for chest pain, palpitations and leg swelling.  Gastrointestinal: Positive for abdominal pain, diarrhea (chronic, slightly improved), nausea and vomiting. Negative for constipation.  Genitourinary: Negative for difficulty urinating and dysuria.  Musculoskeletal: Negative for arthralgias and myalgias.  Skin: Negative for color change and wound.  Neurological: Positive for weakness (no longer gets oob, due to nausea and weakness). Negative for dizziness.  Psychiatric/Behavioral: Negative for agitation,  behavioral problems and confusion.    Past Medical History:  Diagnosis Date  . ANEMIA-UNSPECIFIED 07/03/2009  . Depression   . GERD (gastroesophageal reflux disease)   . Hiatal hernia   . HYPERGLYCEMIA 02/26/2007  . Hyperlipidemia   . Hypertension   . LBP (low back pain)   . Osteopenia   . Renal insufficiency 2007   creat 1.3  . UNSPECIFIED PERIPHERAL VASCULAR DISEASE 06/08/2010   Past Surgical History:  Procedure Laterality Date  . APPENDECTOMY  1962  . CHOLECYSTECTOMY    . [place]  . ERCP N/A 06/22/2015   Procedure: ENDOSCOPIC RETROGRADE CHOLANGIOPANCREATOGRAPHY (ERCP);  Surgeon: Ladene Artist, MD;  Location: [facility];  Service: Endoscopy;  Laterality: N/A;  . ESOPHAGOGASTRODUODENOSCOPY     with dilatation Dr Fuller Plan  . EUS N/A 07/27/2015   Procedure: UPPER ENDOSCOPIC ULTRASOUND (EUS) RADIAL;  Surgeon: [name], MD;  Location: [facility];  Service: Endoscopy;  Laterality: N/A;  . ingrown toenails      x3  . OOPHORECTOMY  1962  . PARATHYROIDECTOMY    . tail bone surgery    . TONSILLECTOMY AND ADENOIDECTOMY    . WRIST FRACTURE SURGERY  10/01   Social History:   reports that she has never smoked. She has never used smokeless tobacco. She reports that she does not drink alcohol or use drugs.  Family History  Problem Relation Age of Onset  . Kidney disease Father     failure  . Colon cancer Sister     Medications: Patient's Medications  New Prescriptions   No medications on file  Previous Medications  ACETAMINOPHEN (TYLENOL) 500 MG TABLET    Take 500 mg by mouth every 8 (eight) hours as needed for mild pain or moderate pain.   AMBULATORY NON FORMULARY MEDICATION    Give health shake (strawberry if available) by mouth twice daily.   AMBULATORY NON FORMULARY MEDICATION    Give 1 magic cup by mouth twice daily.   AMBULATORY NON FORMULARY MEDICATION    Give 120 ml of NSA MedPass by mouth twice daily.   CALCIUM CARBONATE (TUMS  - DOSED IN MG ELEMENTAL CALCIUM) 500 MG CHEWABLE TABLET    Chew 1 tablet by mouth every 4 (four) hours as needed for indigestion or heartburn.    CHOLECALCIFEROL (VITAMIN D-3) 1000 UNITS CAPS    Take 1 capsule by mouth daily.   CHOLESTYRAMINE (QUESTRAN) 4 G PACKET    Mix 1 packet (4gm) in 2-6 oz liquid and give by mouth twice daily for diarrhea.   CITALOPRAM (CELEXA) 20 MG TABLET    TAKE 1 TABLET BY MOUTH DAILY.   FUROSEMIDE (LASIX) 40 MG TABLET    Take 40 mg by mouth daily.   GABAPENTIN (NEURONTIN) 100 MG CAPSULE    Take 100 mg by mouth at bedtime.    HALOPERIDOL (HALDOL) 2 MG TABLET    Take 2 mg by mouth every 4 (four) hours as needed for agitation. For nausea and vomiting.   LIPASE/PROTEASE/AMYLASE (CREON) 12000 UNITS CPEP CAPSULE    Take 1 capsule (12,000 Units total) by mouth 3 (three) times daily before meals.   MORPHINE (ROXANOL) 20 MG/ML CONCENTRATED SOLUTION    Give 0.31ml by mouth every 4 hours as needed for pain/SOB (control)   OMEPRAZOLE (PRILOSEC) 40 MG CAPSULE    TAKE 1 CAPSULE BY MOUTH DAILY.   ONDANSETRON (ZOFRAN) 4 MG TABLET    Take 4 mg by mouth every 8 (eight) hours as needed for nausea or vomiting.   OXYBUTYNIN (DITROPAN XL) 15 MG 24 HR TABLET    Take 15 mg by mouth at bedtime.   POTASSIUM CHLORIDE SA (K-DUR,KLOR-CON) 20 MEQ TABLET    Take 20 mEq by mouth 2 (two) times daily.   PROCHLORPERAZINE (COMPAZINE) 5 MG TABLET    Take 5 mg by mouth 3 (three) times daily before meals. For nausea and vomiting.   QC ANTI-DIARRHEAL 2 MG TABLET    Take 2 mg by mouth 4 (four) times daily as needed for diarrhea or loose stools.   TROLAMINE SALICYLATE (ASPERCREME) 10 % CREAM    Apply 1 application topically 2 (two) times daily as needed for muscle pain.  Modified Medications   No medications on file  Discontinued Medications   CALCIUM CARBONATE (OS-CAL) 600 MG TABLET    Take 1 tablet (600 mg total) by mouth 2 (two) times daily.   QC ARTHRITIS PAIN RELIEF 650 MG CR TABLET    Take 1,300 mg by  mouth daily as needed for pain.     Physical Exam: Vitals:   10/18/16 1337  BP: 130/82  Pulse: 72  Resp: 18  Temp: 99.1 F (37.3 C)  SpO2: 98%  Weight: 150 lb 6.4 oz (68.2 kg)  Height: 5\' 1"  (1.549 m)    Physical Exam  Constitutional: She is oriented to person, place, and time. She appears well-developed and well-nourished. No distress.  HENT:  Head: Normocephalic and atraumatic.  Mouth/Throat: Oropharynx is clear and moist. No oropharyngeal exudate.  Eyes: Conjunctivae are normal. Pupils are equal, round, and reactive to light.  Neck: Normal range of motion.  Neck supple.  Cardiovascular: Normal rate, regular rhythm and normal heart sounds.   Pulmonary/Chest: Effort normal and breath sounds normal.  Abdominal: Soft. Bowel sounds are normal. She exhibits no distension. There is tenderness.  Musculoskeletal: She exhibits no edema or tenderness.  Neurological: She is alert and oriented to person, place, and time.  Skin: Skin is warm and dry. She is not diaphoretic.  Psychiatric: She has a normal mood and affect.    Labs reviewed: Basic Metabolic Panel:  Recent Labs  08/18/16 0513 08/19/16 0413 08/20/16 0409 08/21/16 08/30/16 09/04/16  NA 144 144 142 147 141 144  K 4.1 4.4 4.2 4.7 2.9* 3.2*  CL 114* 113* 114*  --   --   --   CO2 21* 21* 21*  --   --   --   GLUCOSE 128* 100* 118*  --   --   --   BUN 48* 49* 50* 47* 32* 34*  CREATININE 3.32* 3.14* 2.97* 2.7* 1.9* 2.1*  CALCIUM 8.1* 7.8* 7.9*  --   --   --    Liver Function Tests:  Recent Labs  08/30/16  AST 20  ALT 17  ALKPHOS 74   No results for input(s): LIPASE, AMYLASE in the last 8760 hours. No results for input(s): AMMONIA in the last 8760 hours. CBC:  Recent Labs  08/07/16 1143 08/17/16 0023 08/17/16 0414  08/18/16 0513  08/30/16 09/02/16 09/04/16  WBC 5.9 5.4 4.9  --  5.2  < > 7.3 10.0 6.6  NEUTROABS 3.6 2.7  --   --   --   --   --   --   --   HGB 10.9* 9.3* 8.8*  < > 9.5*  < > 10.5* 11.0* 9.5*    HCT 33.0* 28.2* 26.7*  < > 29.1*  < > 33* 34* 30*  MCV 88.9 86.5 87.8  --  89.0  --   --   --   --   PLT 153 135* 136*  --  136*  < > 150 199 136*  < > = values in this interval not displayed. TSH: No results for input(s): TSH in the last 8760 hours. A1C: Lab Results  Component Value Date   HGBA1C 10.7 (H) 07/28/2015   Lipid Panel: No results for input(s): CHOL, HDL, LDLCALC, TRIG, CHOLHDL, LDLDIRECT in the last 8760 hours.  Assessment/Plan 1. Malignant neoplasm of head of pancreas ([place]) Pt goal is comfort, reports she would like relief from pain which has increased over the last week. Currently getting morphine 5 mg q 4 hours PRN.   2. Lower leg edema No LE edema at this time. Staying in bed most of the time due to pain/nausea and vomiting. Will DC lasix and potassium at this time  3. Hypokalemia Potassium added but now with stopping lasix will stop potassium. Will follow up BMP to evaluate.   4. Mixed stress and urge urinary incontinence Will dc oxybutynin at this time.   Carlos American. Harle Battiest  [facility] [phone] 8 am - 5 pm) [phone] (after hours)

## 2016-10-19 LAB — BASIC METABOLIC PANEL
BUN: 44 mg/dL — AB (ref 4–21)
CREATININE: 3.6 mg/dL — AB (ref 0.5–1.1)
GLUCOSE: 18 mg/dL
POTASSIUM: 3.8 mmol/L (ref 3.4–5.3)
Sodium: 147 mmol/L (ref 137–147)

## 2016-10-19 LAB — TSH: TSH: 0.34 u[IU]/mL — AB (ref 0.41–5.90)

## 2016-11-03 DEATH — deceased

## 2018-07-01 IMAGING — CR DG LUMBAR SPINE 2-3V
3 series · 3 of 3 positions shown · non-contrast
Comparison: CT abdomen pelvis of 06/20/2015 and lumbar spine films
of 05/08/2015

CLINICAL DATA: Fell at home last night, cough, shortness of breath,
history of pancreatic carcinoma

EXAM:
LUMBAR SPINE - 2-3 VIEW

[t lumbar spine ap]
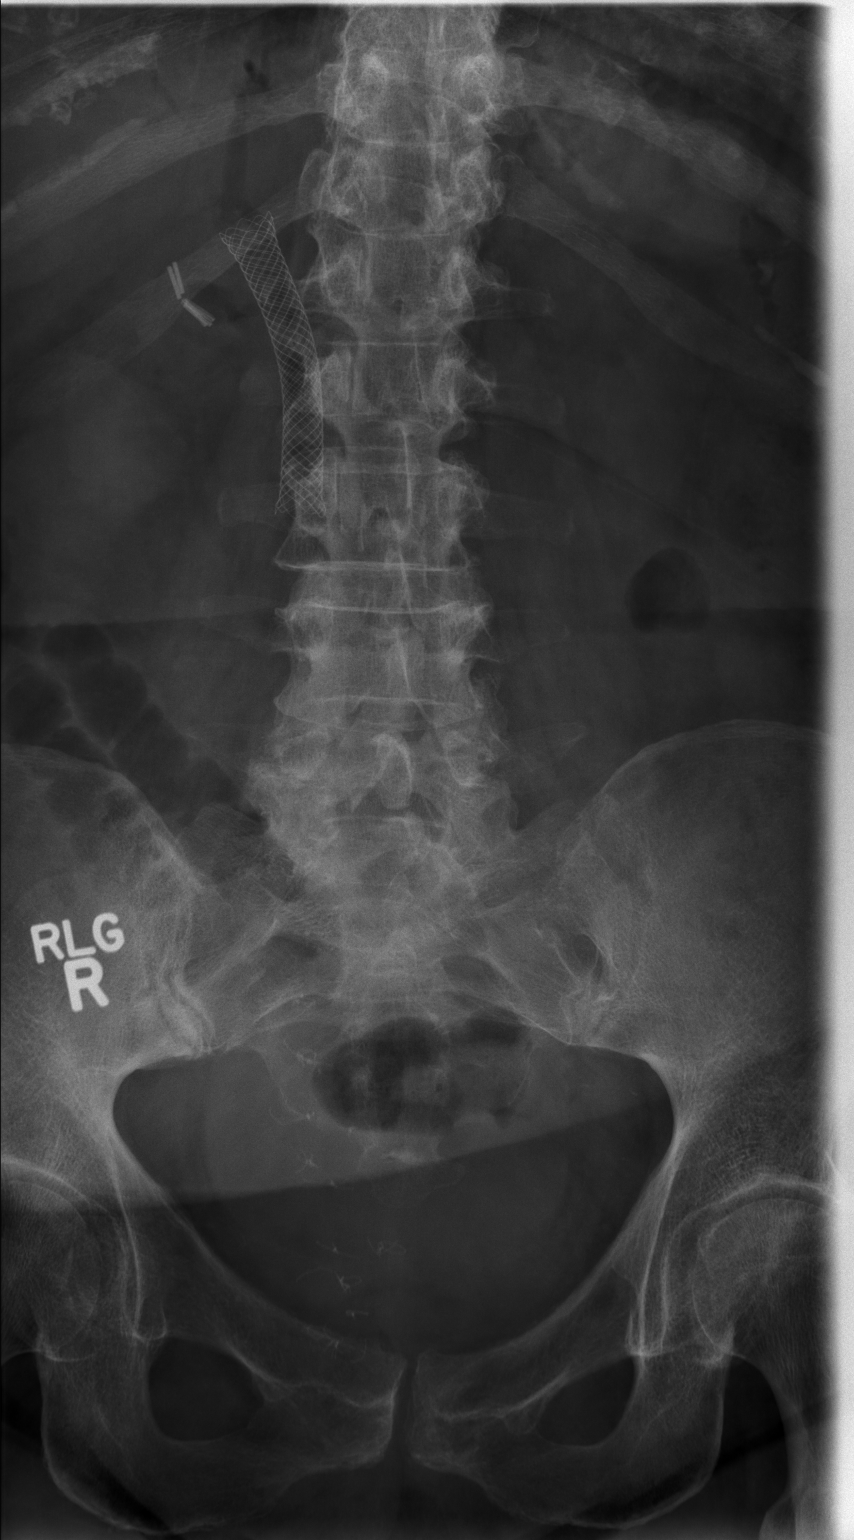

[t lumbar spine lat]
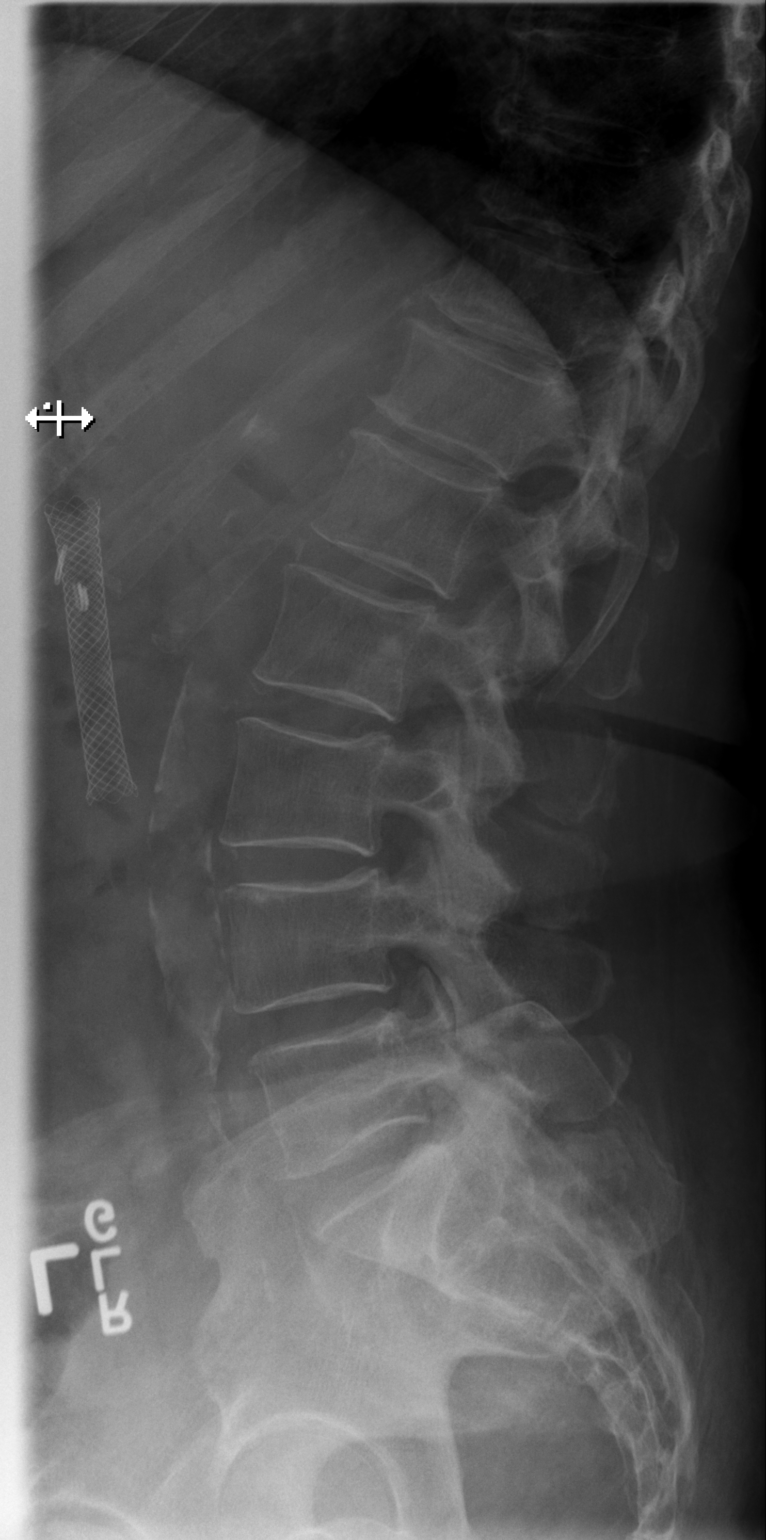

[t lumbar l-5 s-1 spot]
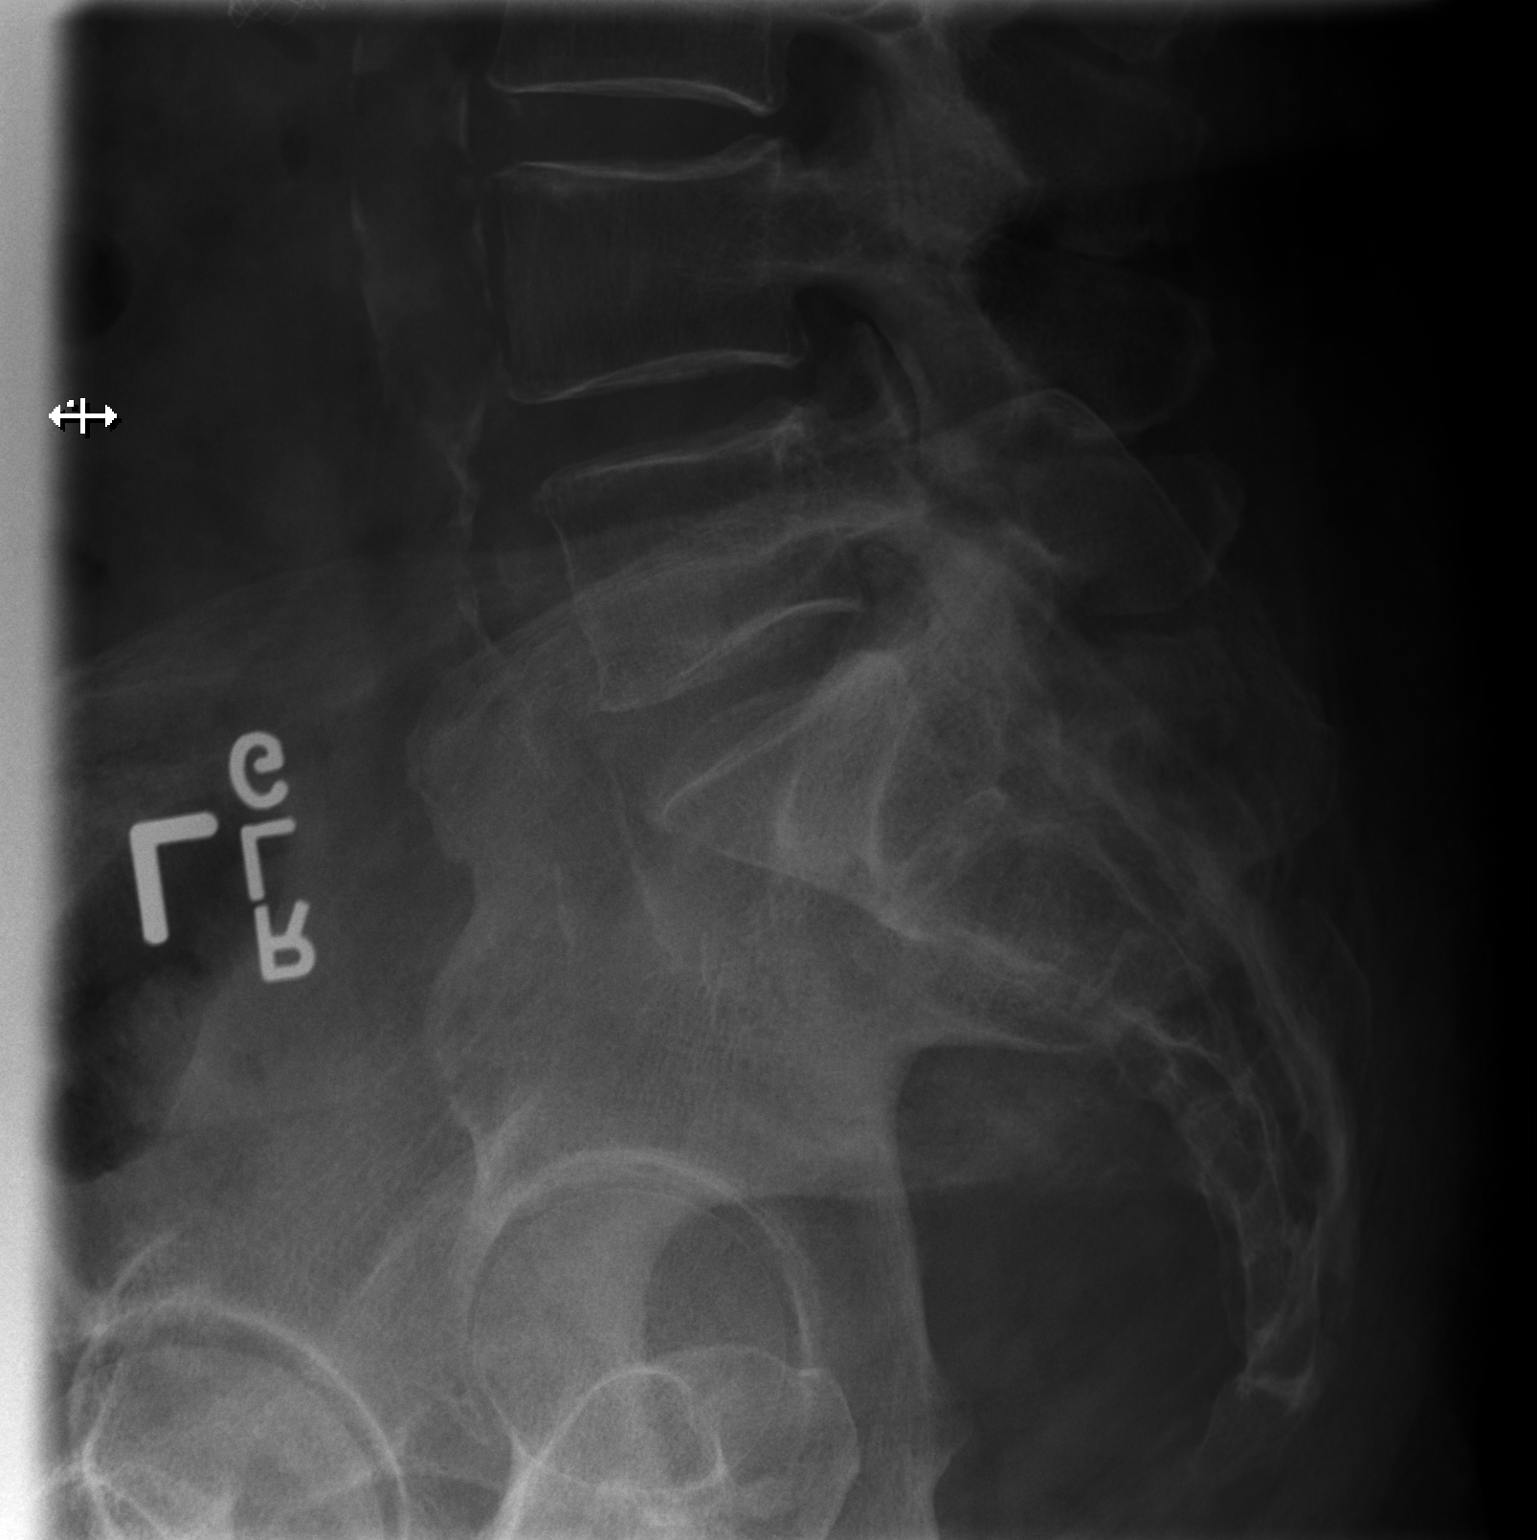

[3 of 3 positions shown; findings below may reference images not displayed]

FINDINGS: A common bile duct stent is present and clips are present from prior
cholecystectomy. The lumbar vertebrae remain in normal alignment.
Intervertebral disc spaces appear normal. No compression deformity
is seen. The SI joints are unremarkable.
IMPRESSION: Normal alignment with normal disc spaces. No acute compression
deformity.

## 2018-07-11 IMAGING — CR DG HIP (WITH OR WITHOUT PELVIS) 2-3V*L*
3 series · 3 of 3 positions shown · non-contrast
Comparison: None.

CLINICAL DATA: Left hip pain after a fall. History of lower
extremity weakness for 3 months. Multiple previous falls. History of
pancreatic cancer.

EXAM:
DG HIP (WITH OR WITHOUT PELVIS) 2-3V LEFT

[x pelvis]
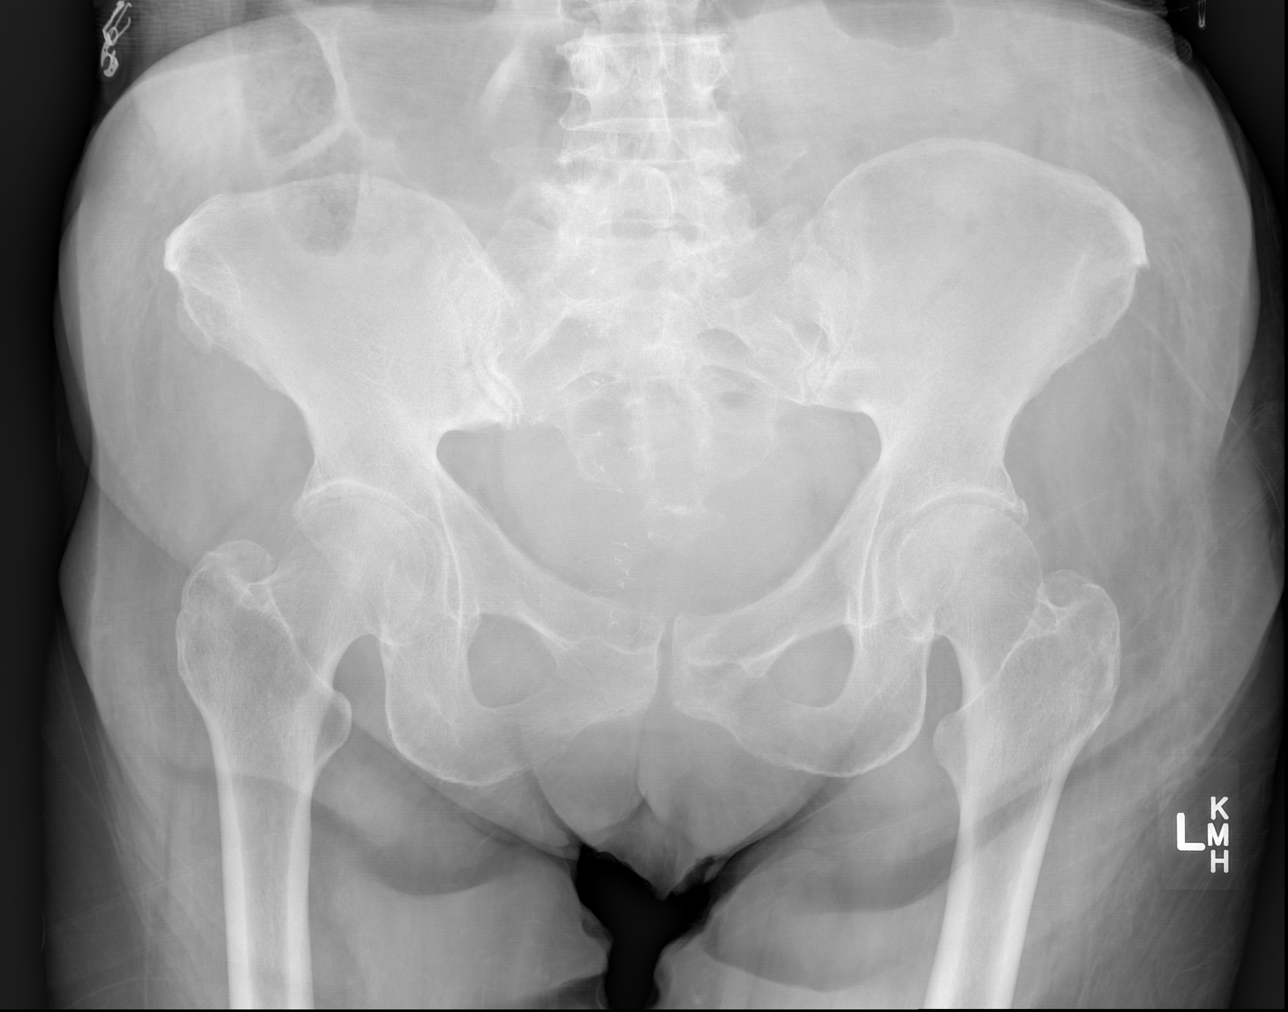

[x hip ap left]
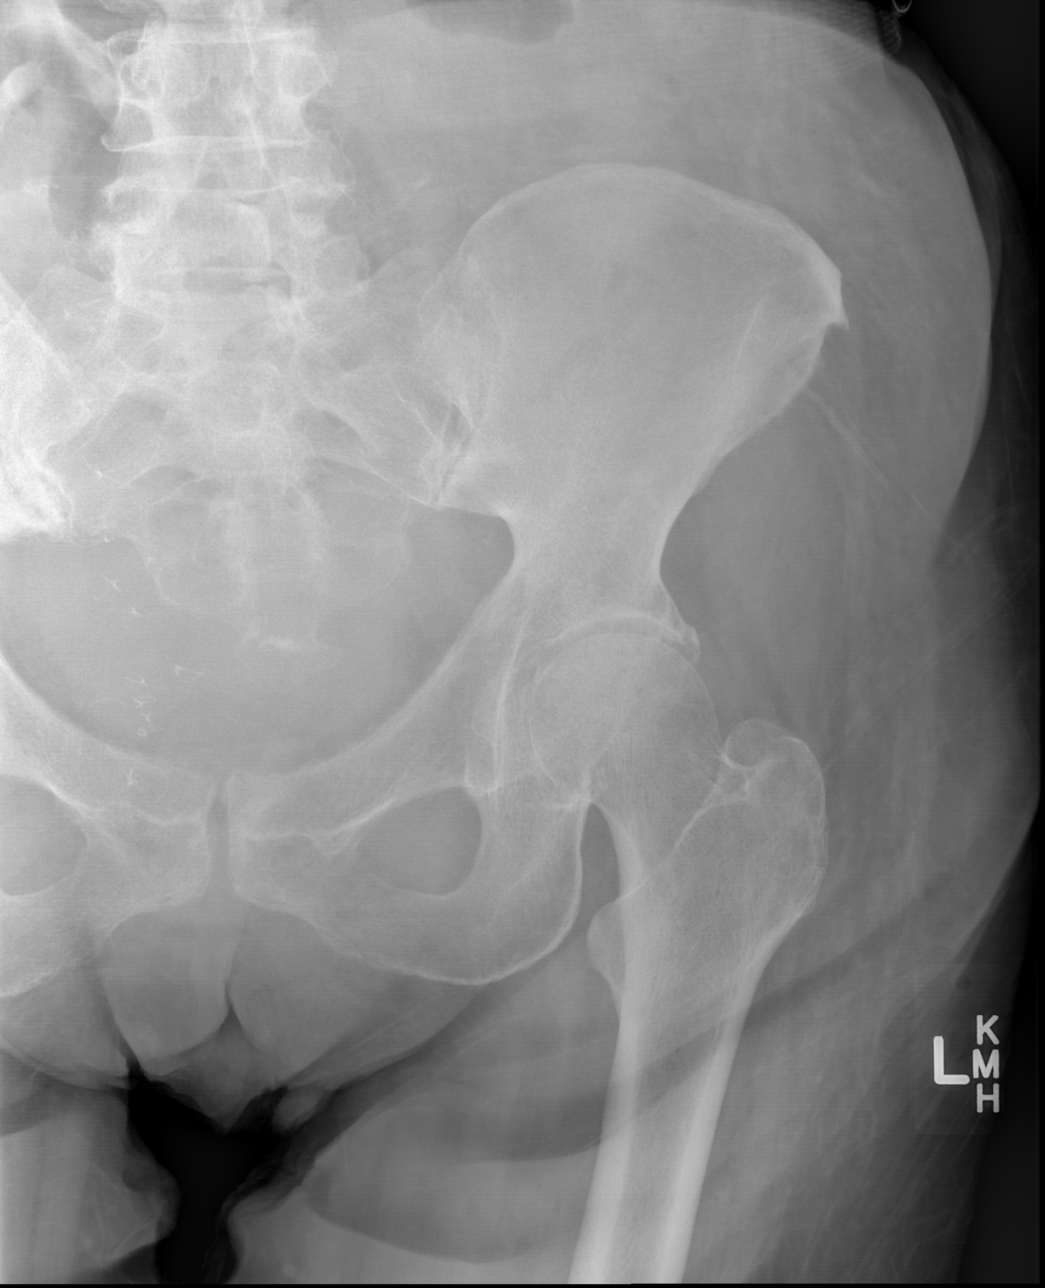

[x hip lat left]
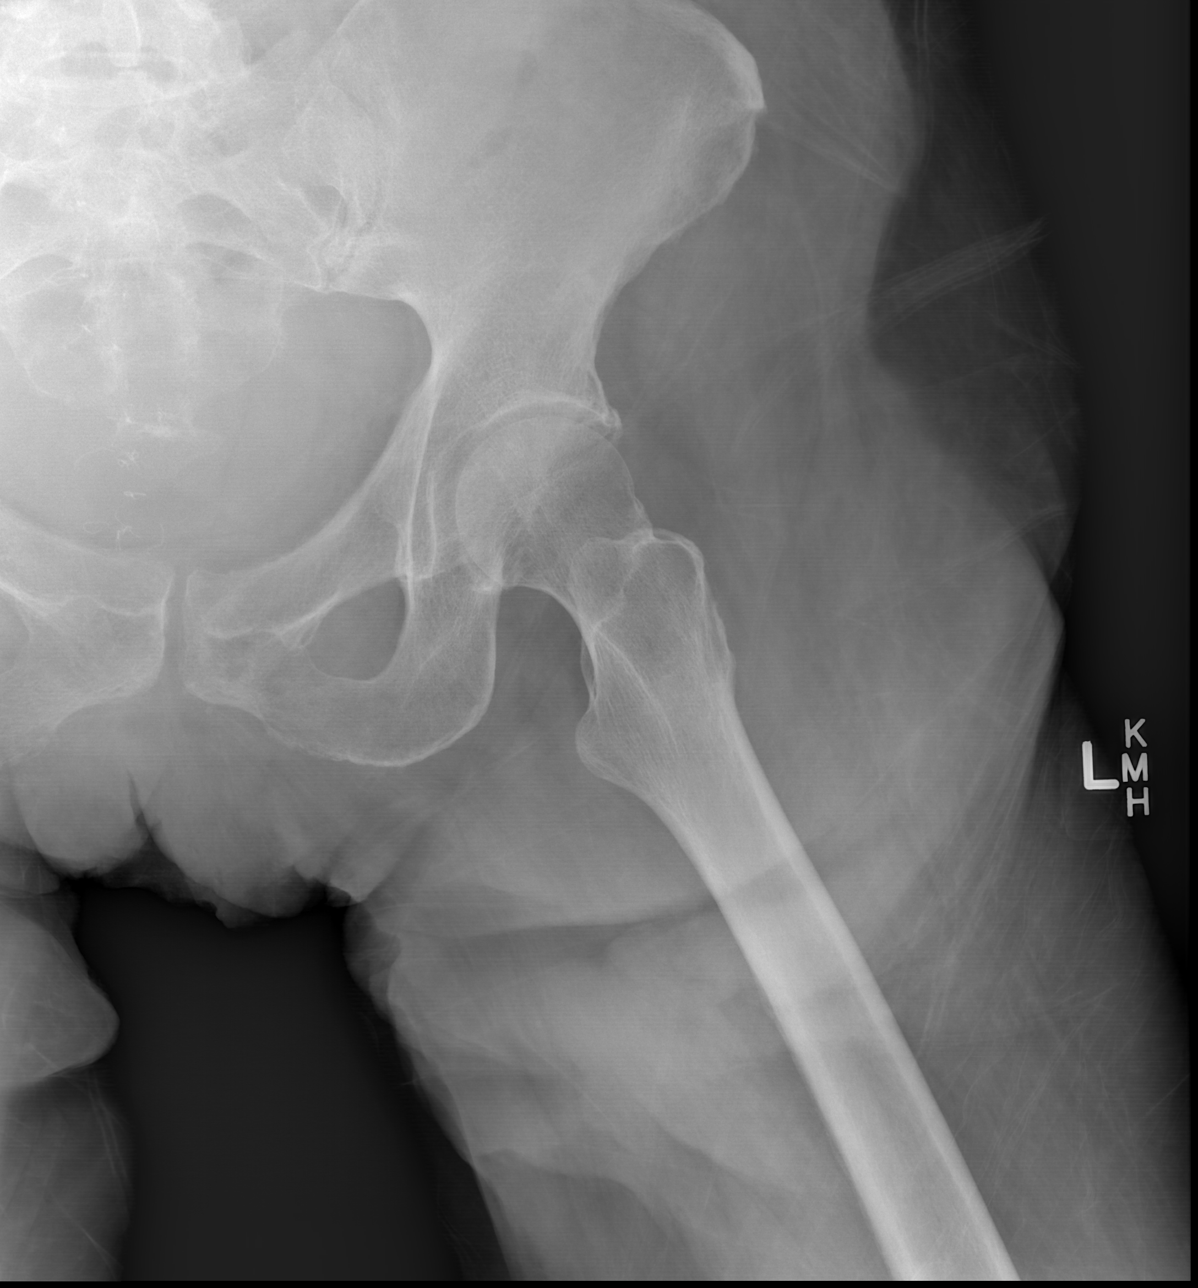

[3 of 3 positions shown; findings below may reference images not displayed]

FINDINGS: No evidence of acute fracture or dislocation of the left hip. Pelvis
and visualized sacrum appear intact. No focal bone lesion or bone
destruction. SI joints and symphysis pubis are not displaced.
Degenerative changes in the lower lumbar spine and in both hips.
There is suggestion of infiltration in the subcutaneous fat lateral
to the left hip probably representing hematoma.
IMPRESSION: No acute bony abnormalities. Degenerative changes. Probable hematoma
in the soft tissues lateral to the left hip.
